# Patient Record
Sex: Male | Born: 1955 | Race: White | Hispanic: No | Marital: Married | Smoking: Former smoker
Health system: Southern US, Community
[De-identification: ages and names within clinical notes are randomized; demographics above are authoritative.]

## PROBLEM LIST (undated history)

## (undated) DIAGNOSIS — B029 Zoster without complications: Secondary | ICD-10-CM

## (undated) DIAGNOSIS — L570 Actinic keratosis: Secondary | ICD-10-CM

## (undated) HISTORY — DX: Zoster without complications: B02.9

## (undated) HISTORY — DX: Actinic keratosis: L57.0

## (undated) HISTORY — PX: LASIK: SHX215

---

## 1988-04-05 HISTORY — PX: HERNIA REPAIR: SHX51

## 2000-04-05 HISTORY — PX: NOSE SURGERY: SHX723

## 2000-04-05 HISTORY — PX: TONSILLECTOMY AND ADENOIDECTOMY: SUR1326

## 2003-03-06 ENCOUNTER — Other Ambulatory Visit: Payer: Self-pay

## 2003-04-06 DIAGNOSIS — K219 Gastro-esophageal reflux disease without esophagitis: Secondary | ICD-10-CM | POA: Insufficient documentation

## 2003-04-08 ENCOUNTER — Ambulatory Visit (HOSPITAL_COMMUNITY): Admission: RE | Admit: 2003-04-08 | Discharge: 2003-04-08 | Payer: Self-pay | Admitting: Cardiovascular Disease

## 2005-03-25 ENCOUNTER — Inpatient Hospital Stay: Payer: Self-pay | Admitting: Specialist

## 2005-09-13 DIAGNOSIS — E781 Pure hyperglyceridemia: Secondary | ICD-10-CM | POA: Insufficient documentation

## 2006-03-03 DIAGNOSIS — G4733 Obstructive sleep apnea (adult) (pediatric): Secondary | ICD-10-CM | POA: Insufficient documentation

## 2006-04-05 HISTORY — PX: KNEE SURGERY: SHX244

## 2006-09-05 DIAGNOSIS — Z8249 Family history of ischemic heart disease and other diseases of the circulatory system: Secondary | ICD-10-CM | POA: Insufficient documentation

## 2006-09-05 DIAGNOSIS — E291 Testicular hypofunction: Secondary | ICD-10-CM | POA: Insufficient documentation

## 2006-09-05 DIAGNOSIS — E349 Endocrine disorder, unspecified: Secondary | ICD-10-CM | POA: Insufficient documentation

## 2007-08-30 ENCOUNTER — Ambulatory Visit: Payer: Self-pay | Admitting: Gastroenterology

## 2007-08-30 LAB — HM COLONOSCOPY

## 2007-10-12 ENCOUNTER — Ambulatory Visit: Payer: Self-pay | Admitting: Specialist

## 2009-09-23 DIAGNOSIS — I10 Essential (primary) hypertension: Secondary | ICD-10-CM | POA: Insufficient documentation

## 2009-09-28 DIAGNOSIS — J309 Allergic rhinitis, unspecified: Secondary | ICD-10-CM | POA: Insufficient documentation

## 2010-06-24 ENCOUNTER — Emergency Department: Payer: Self-pay | Admitting: Emergency Medicine

## 2013-03-09 ENCOUNTER — Ambulatory Visit: Payer: Self-pay | Admitting: Unknown Physician Specialty

## 2013-07-27 LAB — LIPID PANEL
Cholesterol: 172 mg/dL (ref 0–200)
HDL: 33 mg/dL — AB (ref 35–70)
LDL Cholesterol: 73 mg/dL
Triglycerides: 331 mg/dL — AB (ref 40–160)

## 2013-07-27 LAB — PSA: PSA: 0.8

## 2013-07-27 LAB — CBC AND DIFFERENTIAL
HCT: 46 % (ref 41–53)
Hemoglobin: 15.4 g/dL (ref 13.5–17.5)
Platelets: 282 10*3/uL (ref 150–399)

## 2013-07-27 LAB — HEPATIC FUNCTION PANEL
ALT: 29 U/L (ref 10–40)
AST: 23 U/L (ref 14–40)

## 2013-07-27 LAB — BASIC METABOLIC PANEL: Glucose: 94 mg/dL

## 2013-07-27 LAB — TSH: TSH: 3.38 u[IU]/mL (ref <=5.90)

## 2014-09-20 ENCOUNTER — Other Ambulatory Visit: Payer: Self-pay | Admitting: Family Medicine

## 2014-09-20 MED ORDER — LOVASTATIN 20 MG PO TABS: 20.0000 mg | ORAL_TABLET | Freq: Every day | ORAL | Status: DC

## 2014-09-23 ENCOUNTER — Other Ambulatory Visit: Payer: Self-pay | Admitting: Family Medicine

## 2014-09-26 DIAGNOSIS — M545 Low back pain, unspecified: Secondary | ICD-10-CM | POA: Insufficient documentation

## 2014-09-26 DIAGNOSIS — B029 Zoster without complications: Secondary | ICD-10-CM

## 2014-09-26 HISTORY — DX: Zoster without complications: B02.9

## 2014-09-27 ENCOUNTER — Ambulatory Visit (INDEPENDENT_AMBULATORY_CARE_PROVIDER_SITE_OTHER): Payer: BC Managed Care – PPO | Admitting: Family Medicine

## 2014-09-27 ENCOUNTER — Encounter: Payer: Self-pay | Admitting: Family Medicine

## 2014-09-27 VITALS — BP 120/80 | HR 80 | Temp 98.0°F | Resp 16 | Ht 70.0 in | Wt 187.0 lb

## 2014-09-27 DIAGNOSIS — E781 Pure hyperglyceridemia: Secondary | ICD-10-CM | POA: Diagnosis not present

## 2014-09-27 DIAGNOSIS — Z0000 Encounter for general adult medical examination without abnormal findings: Secondary | ICD-10-CM

## 2014-09-27 DIAGNOSIS — I10 Essential (primary) hypertension: Secondary | ICD-10-CM | POA: Diagnosis not present

## 2014-09-27 DIAGNOSIS — E291 Testicular hypofunction: Secondary | ICD-10-CM | POA: Diagnosis not present

## 2014-09-27 DIAGNOSIS — K219 Gastro-esophageal reflux disease without esophagitis: Secondary | ICD-10-CM | POA: Diagnosis not present

## 2014-09-27 DIAGNOSIS — G4733 Obstructive sleep apnea (adult) (pediatric): Secondary | ICD-10-CM

## 2014-09-27 DIAGNOSIS — J309 Allergic rhinitis, unspecified: Secondary | ICD-10-CM | POA: Diagnosis not present

## 2014-09-27 DIAGNOSIS — R5383 Other fatigue: Secondary | ICD-10-CM | POA: Diagnosis not present

## 2014-09-27 NOTE — Progress Notes (Signed)
Patient: Tyler Roberts, Male    DOB: [birth year], 59 y.o.   MRN: [id number] Visit Date: 09/27/2014  Today's Provider: Lelon Huh, MD   No chief complaint on file.  Subjective:    Annual physical exam Tyler Roberts is a 59 y.o. male who presents today for health maintenance and complete physical. He feels fairly well. He reports exercising yes. He reports he is sleeping poorly around 6 hours a night.     Hypertension, follow-up:  BP Readings from Last 3 Encounters:  07/02/13 142/80    He was last seen for hypertension 15  months ago.  BP at that visit was 142/80. Management changes since that visit include no changes .He reports good compliance with treatment. He is not having side effects none. He is exercising. He is adherent to low salt diet.   Outside blood pressures are 120/80. He is experiencing fatigue.  Patient denies none.   Cardiovascular risk factors include none.  Use of agents associated with hypertension: none.   ------------------------------------------------------------------------    Lipid/Cholesterol, Follow-up:   Last seen for this 15  months ago.  Management changes since that visit include continue Lovastatin 20 mg qd .  Last Lipid Panel:    Component Value Date/Time   CHOL 172 07/27/2013   TRIG 331* 07/27/2013   HDL 33* 07/27/2013   LDLCALC 73 07/27/2013    He reports good compliance with treatment. He is not having side effects. noe  Wt Readings from Last 3 Encounters:  09/27/14 187 lb (84.823 kg)  07/02/13 192 lb (87.091 kg)    ------------------------------------------------------------------------  Fatigue the last few months. Has had more stress and not sleeping as well. No other health problems lately.   Sleep Apnea Continues to use CPAP every night and working well.   Review of Systems  Constitutional: Positive for fatigue. Negative for fever, chills, diaphoresis, activity change, appetite change and unexpected weight  change.  HENT: Positive for congestion and sinus pressure. Negative for dental problem, drooling, ear discharge, ear pain, facial swelling, hearing loss, mouth sores, nosebleeds, postnasal drip, rhinorrhea, sneezing, sore throat, tinnitus, trouble swallowing and voice change.   Eyes: Negative.   Respiratory: Positive for apnea. Negative for cough, choking, chest tightness, shortness of breath, wheezing and stridor.   Cardiovascular: Negative.   Endocrine: Negative.   Genitourinary: Negative.   Musculoskeletal: Positive for arthralgias. Negative for myalgias, back pain, joint swelling, gait problem, neck pain and neck stiffness.  Skin: Negative.   Allergic/Immunologic: Positive for environmental allergies. Negative for food allergies and immunocompromised state.  Neurological: Negative.   Hematological: Negative.   Psychiatric/Behavioral: Negative.     Social History He  reports that he has been smoking Cigars.  He has never used smokeless tobacco. He reports that he drinks alcohol. He reports that he does not use illicit drugs.  Patient Active Problem List   Diagnosis Date Noted  . 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 LBP (low back pain) 09/26/2014  . Allergic rhinitis 09/28/2009  . 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 Benign essential hypertension 09/23/2009  . Fam hx-ischem heart disease 09/05/2006  . Testicular hypofunction 09/05/2006  . Obstructive apnea 03/03/2006  . Hyperglyceridemia, pure 09/13/2005  . Acid reflux 04/06/2003    Past Surgical History  Procedure Laterality Date  . Knee surgery Right 2008    Due to a staph infection  . Tonsillectomy and adenoidectomy  2002  . Nose surgery  2002  . Hernia repair  1990  . Lasik  Family History His family history includes Anuerysm in his mother; Healthy in his brother, sister, and sister; Heart attack in his father; Heart disease (age of onset: 66's) in his father; Ovarian cancer in his sister.    Previous  Medications   AMLODIPINE (NORVASC) 5 MG TABLET    Take by mouth.   ASPIRIN 81 MG TABLET       ESOMEPRAZOLE (NEXIUM) 40 MG CAPSULE    Take by mouth.   FLUNISOLIDE (NASALIDE) 25 MCG/ACT (0.025%) SOLN    ONE SPRAY IN EACH NOSTRIL EVERY DAY   LOVASTATIN (MEVACOR) 20 MG TABLET    Take 1 tablet (20 mg total) by mouth daily.   METOPROLOL SUCCINATE (TOPROL-XL) 25 MG 24 HR TABLET    Take by mouth.    Patient Care Team: [name], MD as PCP - General (Family Medicine)     Objective:   Vitals: BP 120/80 mmHg  Pulse 80  Temp(Src) 98 F (36.7 C) (Oral)  Resp 16  Ht 5\' 10"  (1.778 m)  Wt 187 lb (84.823 kg)  BMI 26.83 kg/m2  SpO2 97%   Physical Exam  General Appearance:    Alert, cooperative, no distress, appears stated age  Head:    Normocephalic, without obvious abnormality, atraumatic  Eyes:    PERRL, conjunctiva/corneas clear, EOM's intact, fundi    benign, both eyes       Ears:    Normal TM's and external ear canals, both ears  Nose:   Nares normal, septum midline, mucosa normal, no drainage   or sinus tenderness  Throat:   Lips, mucosa, and tongue normal; teeth and gums normal  Neck:   Supple, symmetrical, trachea midline, no adenopathy;       thyroid:  No enlargement/tenderness/nodules; no carotid   bruit or JVD  Back:     Symmetric, no curvature, ROM normal, no CVA tenderness  Lungs:     Clear to auscultation bilaterally, respirations unlabored  Chest wall:    No tenderness or deformity  Heart:    Regular rate and rhythm, S1 and S2 normal, no murmur, rub   or gallop  Abdomen:     Soft, non-tender, bowel sounds active all four quadrants,    no masses, no organomegaly  Genitalia:    deferred  Rectal:    deferred  Extremities:   Extremities normal, atraumatic, no cyanosis or edema  Pulses:   2+ and symmetric all extremities  Skin:   Skin color, texture, turgor normal, no rashes or lesions  Lymph nodes:   Cervical, supraclavicular, and axillary nodes normal  Neurologic:    CNII-XII intact. Normal strength, sensation and reflexes      throughout     Depression Screen PHQ 2/9 Scores 09/27/2014  PHQ - 2 Score 0    EKG Sinus  Rhythm  WITHIN NORMAL LIMITS    Assessment & Plan:     Routine Health Maintenance and Physical Exam  Exercise Activities and Dietary recommendations Goals    None      Immunization History  Administered Date(s) Administered  . Tdap 07/25/2007    Health Maintenance  Topic Date Due  . HIV Screening  01/03/1971  . COLONOSCOPY  01/02/2006  . INFLUENZA VACCINE  11/04/2014  . TETANUS/TDAP  07/24/2017      Discussed health benefits of physical activity, and encouraged him to engage in regular exercise appropriate for his age and condition.    --------------------------------------------------------------------   1. Annual physical exam  - PSA  2.  Benign essential hypertension well controlled Continue current medications.  - EKG 12-Lead  3. Gastroesophageal reflux disease, esophagitis presence not specified well controlled Continue PPI - Magnesium  4. Hyperglyceridemia, pure He is tolerating lovastatin well with no adverse effects.   - Lipid panel  5. Obstructive apnea Using CPAP every day and working well.   6. Allergic rhinitis, unspecified allergic rhinitis type Doing well with prn nasal steroid  7. Testicular hypofunction  - Testosterone,Free and Total  8. Other fatigue Seems to stress related.  - CBC - TSH - Comprehensive metabolic panel

## 2014-09-28 LAB — CBC
Hematocrit: 44 % (ref 37.5–51.0)
Hemoglobin: 15.1 g/dL (ref 12.6–17.7)
MCH: 29.7 pg (ref 26.6–33.0)
MCHC: 34.3 g/dL (ref 31.5–35.7)
MCV: 86 fL (ref 79–97)
Platelets: 246 10*3/uL (ref 150–379)
RBC: 5.09 x10E6/uL (ref 4.14–5.80)
RDW: 14.1 % (ref 12.3–15.4)
WBC: 5.6 10*3/uL (ref 3.4–10.8)

## 2014-09-28 LAB — LIPID PANEL
Chol/HDL Ratio: 4.3 ratio units (ref 0.0–5.0)
Cholesterol, Total: 176 mg/dL (ref 100–199)
HDL: 41 mg/dL (ref >39)
LDL Calculated: 102 mg/dL — ABNORMAL HIGH (ref 0–99)
Triglycerides: 166 mg/dL — ABNORMAL HIGH (ref 0–149)
VLDL Cholesterol Cal: 33 mg/dL (ref 5–40)

## 2014-09-28 LAB — COMPREHENSIVE METABOLIC PANEL
ALT: 25 IU/L (ref 0–44)
AST: 20 IU/L (ref 0–40)
Albumin/Globulin Ratio: 2.2 (ref 1.1–2.5)
Albumin: 4.8 g/dL (ref 3.5–5.5)
Alkaline Phosphatase: 50 IU/L (ref 39–117)
BUN/Creatinine Ratio: 14 (ref 9–20)
BUN: 12 mg/dL (ref 6–24)
Bilirubin Total: 1.2 mg/dL (ref 0.0–1.2)
CO2: 27 mmol/L (ref 18–29)
Calcium: 9.4 mg/dL (ref 8.7–10.2)
Chloride: 102 mmol/L (ref 97–108)
Creatinine, Ser: 0.87 mg/dL (ref 0.76–1.27)
GFR calc Af Amer: 110 mL/min/{1.73_m2} (ref >59)
GFR calc non Af Amer: 95 mL/min/{1.73_m2} (ref >59)
Globulin, Total: 2.2 g/dL (ref 1.5–4.5)
Glucose: 108 mg/dL — ABNORMAL HIGH (ref 65–99)
Potassium: 4.4 mmol/L (ref 3.5–5.2)
Sodium: 143 mmol/L (ref 134–144)
Total Protein: 7 g/dL (ref 6.0–8.5)

## 2014-09-28 LAB — TESTOSTERONE,FREE AND TOTAL: Testosterone, Free: 5.8 pg/mL — ABNORMAL LOW (ref 7.2–24.0)

## 2014-09-28 LAB — TSH: TSH: 1.86 u[IU]/mL (ref 0.450–4.500)

## 2014-09-28 LAB — PSA: Prostate Specific Ag, Serum: 0.9 ng/mL (ref 0.0–4.0)

## 2014-09-28 LAB — MAGNESIUM: Magnesium: 2.1 mg/dL (ref 1.6–2.3)

## 2014-09-30 ENCOUNTER — Encounter: Payer: Self-pay | Admitting: Family Medicine

## 2014-10-01 ENCOUNTER — Telehealth: Payer: Self-pay | Admitting: Family Medicine

## 2014-10-01 DIAGNOSIS — E291 Testicular hypofunction: Secondary | ICD-10-CM

## 2014-10-01 NOTE — Telephone Encounter (Signed)
They will have to fax it because it is not coming through the EMR.

## 2014-10-01 NOTE — Telephone Encounter (Signed)
-----   Message from Luvenia Heller, [place] sent at 10/01/2014  9:19 AM EDT ----- I called and they sent both the free and total again.

## 2014-10-01 NOTE — Telephone Encounter (Signed)
Testosterone level is still low. Need to increase Androgel 1.62% to 4 gel pumps every day, 150g refill x 3. Follow up in 3 months. All other labs are normal.

## 2014-10-02 NOTE — Telephone Encounter (Signed)
Tried calling patient and VM is not set up. Will try again later.

## 2014-10-03 ENCOUNTER — Encounter: Payer: Self-pay | Admitting: Family Medicine

## 2014-10-08 MED ORDER — ANDROGEL PUMP 20.25 MG/ACT (1.62%) TD GEL: TRANSDERMAL | Status: DC

## 2014-10-08 NOTE — Telephone Encounter (Signed)
Patient advised. Prescription called into pharmacy.

## 2014-10-18 ENCOUNTER — Other Ambulatory Visit: Payer: Self-pay | Admitting: Family Medicine

## 2014-10-28 ENCOUNTER — Other Ambulatory Visit: Payer: Self-pay | Admitting: Family Medicine

## 2014-10-30 ENCOUNTER — Other Ambulatory Visit: Payer: Self-pay | Admitting: Family Medicine

## 2014-10-30 NOTE — Telephone Encounter (Signed)
Last office visit was on 09/27/2014

## 2015-02-17 ENCOUNTER — Other Ambulatory Visit: Payer: Self-pay | Admitting: Family Medicine

## 2015-02-19 NOTE — Telephone Encounter (Signed)
Please call in lorazepam.  

## 2015-02-21 NOTE — Telephone Encounter (Signed)
Androgel called into pharmacy.

## 2015-04-17 ENCOUNTER — Other Ambulatory Visit: Payer: Self-pay | Admitting: Family Medicine

## 2015-05-06 ENCOUNTER — Other Ambulatory Visit: Payer: Self-pay | Admitting: Family Medicine

## 2015-05-06 MED ORDER — TESTOSTERONE 10 MG/ACT (2%) TD GEL: 8.0000 | Freq: Every day | TRANSDERMAL | Status: DC

## 2015-05-12 ENCOUNTER — Other Ambulatory Visit: Payer: Self-pay | Admitting: Family Medicine

## 2015-05-14 ENCOUNTER — Other Ambulatory Visit: Payer: Self-pay | Admitting: Family Medicine

## 2015-06-02 ENCOUNTER — Encounter: Payer: Self-pay | Admitting: Physician Assistant

## 2015-06-02 ENCOUNTER — Ambulatory Visit (INDEPENDENT_AMBULATORY_CARE_PROVIDER_SITE_OTHER): Payer: BC Managed Care – PPO | Admitting: Physician Assistant

## 2015-06-02 VITALS — BP 160/90 | HR 78 | Temp 99.6°F | Resp 16 | Wt 192.0 lb

## 2015-06-02 DIAGNOSIS — J189 Pneumonia, unspecified organism: Secondary | ICD-10-CM

## 2015-06-02 DIAGNOSIS — R0989 Other specified symptoms and signs involving the circulatory and respiratory systems: Secondary | ICD-10-CM | POA: Diagnosis not present

## 2015-06-02 DIAGNOSIS — R05 Cough: Secondary | ICD-10-CM | POA: Diagnosis not present

## 2015-06-02 DIAGNOSIS — R059 Cough, unspecified: Secondary | ICD-10-CM

## 2015-06-02 DIAGNOSIS — J181 Lobar pneumonia, unspecified organism: Secondary | ICD-10-CM

## 2015-06-02 MED ORDER — HYDROCODONE-HOMATROPINE 5-1.5 MG/5ML PO SYRP: 5.0000 mL | ORAL_SOLUTION | Freq: Three times a day (TID) | ORAL | Status: DC | PRN

## 2015-06-02 MED ORDER — LEVOFLOXACIN 750 MG PO TABS: 750.0000 mg | ORAL_TABLET | Freq: Every day | ORAL | Status: DC

## 2015-06-02 NOTE — Progress Notes (Signed)
Patient: Tyler Roberts Male    DOB: 10-Jan-1956   60 y.o.   MRN: [id number] Visit Date: 06/02/2015  Today's Provider: Mar Daring, PA-C   Chief Complaint  Patient presents with  . Cough   Subjective:    Cough This is a new problem. The current episode started in the past 7 days (Friday afternoon). The problem has been gradually worsening. The problem occurs constantly. The cough is productive of blood-tinged sputum. Associated symptoms include ear congestion, a fever (Low grade fever 99 to 100.5), headaches, myalgias, nasal congestion, postnasal drip, rhinorrhea, shortness of breath and wheezing. Pertinent negatives include no chest pain, ear pain, heartburn or sore throat. The symptoms are aggravated by lying down (When he gets hot.). Treatments tried: Mucinex DM and Advil, took some at 10:00 am. Increase Fluids. The treatment provided no relief. His past medical history is significant for pneumonia (in the past.).       Allergies  Allergen Reactions  . Fenofibrate     Other reaction(s): Muscle Pain Elevated CK  . Gemfibrozil     Other reaction(s): Muscle Pain  . Niacin     Flushing   Previous Medications   AMLODIPINE (NORVASC) 5 MG TABLET    Take by mouth.   ASPIRIN 81 MG TABLET       ESOMEPRAZOLE (NEXIUM) 40 MG CAPSULE    TAKE 1 CAPSULE EVERY DAY   FLUNISOLIDE (NASALIDE) 25 MCG/ACT (0.025%) SOLN    SPRAY ONCE INTO EACH NOSTRIL EVERY DAY   LOVASTATIN (MEVACOR) 20 MG TABLET    TAKE ONE TABLET EVERY DAY   METOPROLOL SUCCINATE (TOPROL-XL) 25 MG 24 HR TABLET    TAKE ONE TABLET EVERY DAY   TESTOSTERONE 10 MG/ACT (2%) GEL    Place 8 Act onto the skin daily.    Review of Systems  Constitutional: Positive for fever (Low grade fever 99 to 100.5).  HENT: Positive for congestion, postnasal drip, rhinorrhea, sinus pressure and sneezing. Negative for ear pain and sore throat.   Respiratory: Positive for cough (Last night his cough sounds like a bark), chest tightness,  shortness of breath and wheezing.   Cardiovascular: Negative for chest pain and leg swelling.  Gastrointestinal: Negative for heartburn, nausea, vomiting and abdominal pain.  Musculoskeletal: Positive for myalgias.       Feet and ankles hurting  Neurological: Positive for dizziness and headaches.    Social History  Substance Use Topics  . Smoking status: Former Smoker    Types: Cigars  . Smokeless tobacco: Never Used  . Alcohol Use: 0.0 oz/week    0 Standard drinks or equivalent per week     Comment: Occasional   Objective:   BP 160/90 mmHg  Pulse 78  Temp(Src) 99.6 F (37.6 C) (Oral)  Resp 16  Wt 192 lb (87.091 kg)  SpO2 97%  Physical Exam  Constitutional: He appears well-developed and well-nourished. No distress.  HENT:  Head: Normocephalic and atraumatic.  Right Ear: Hearing, tympanic membrane, external ear and ear canal normal. Tympanic membrane is not erythematous and not bulging. No middle ear effusion.  Left Ear: Hearing, tympanic membrane, external ear and ear canal normal. Tympanic membrane is not erythematous and not bulging.  No middle ear effusion.  Nose: Mucosal edema and rhinorrhea present. Right sinus exhibits no maxillary sinus tenderness and no frontal sinus tenderness. Left sinus exhibits no maxillary sinus tenderness and no frontal sinus tenderness.  Mouth/Throat: Uvula is midline, oropharynx is clear and moist  and mucous membranes are normal. No oropharyngeal exudate, posterior oropharyngeal edema or posterior oropharyngeal erythema.  Eyes: Conjunctivae and EOM are normal. Pupils are equal, round, and reactive to light. Right eye exhibits no discharge. Left eye exhibits no discharge.  Neck: Normal range of motion. Neck supple. No tracheal deviation present. No Brudzinski's sign and no Kernig's sign noted. No thyromegaly present.  Cardiovascular: Normal rate, regular rhythm and normal heart sounds.  Exam reveals no gallop and no friction rub.   No murmur  heard. Pulmonary/Chest: Effort normal. No stridor. No respiratory distress. He has decreased breath sounds in the right lower field. He has no wheezes. He has rhonchi in the right lower field. He has no rales. He exhibits no tenderness and no bony tenderness.  Lymphadenopathy:    He has no cervical adenopathy.  Skin: Skin is warm and dry. He is not diaphoretic.  Vitals reviewed.       Assessment & Plan:     1. Cough Worsening symptoms. Will treat with levaquin as below. Will also give hycodan cough syrup for nighttime cough.  Advised of drowsiness precautions with medication.  He needs to stay well hydrated and get plenty of rest. He is to call the office if symptoms worsen or fail to improve.  May consider adding prednisone if not much improvement in the next few days. We did discuss obtaining a CXR due to abnormal lung sounds and discomfort over right side of back with pink tinged sputum but patient refused at this time.  - levofloxacin (LEVAQUIN) 750 MG tablet; Take 1 tablet (750 mg total) by mouth daily.  Dispense: 10 tablet; Refill: 0 - HYDROcodone-homatropine (HYCODAN) 5-1.5 MG/5ML syrup; Take 5 mLs by mouth every 8 (eight) hours as needed for cough.  Dispense: 240 mL; Refill: 0  2. Rhonchi See above medical treatment plan. - levofloxacin (LEVAQUIN) 750 MG tablet; Take 1 tablet (750 mg total) by mouth daily.  Dispense: 10 tablet; Refill: 0  3. Right lower lobe pneumonia Will treat empirically as pneumonia due to lung findings however patient refused CXR at this time.  He is to call if symptoms fail to improve or worsen. - levofloxacin (LEVAQUIN) 750 MG tablet; Take 1 tablet (750 mg total) by mouth daily.  Dispense: 10 tablet; Refill: 0       Mar Daring, PA-C  [facility]

## 2015-06-02 NOTE — Patient Instructions (Signed)

## 2015-06-04 MED ORDER — PREDNISONE 10 MG PO TABS: ORAL_TABLET | ORAL | Status: DC

## 2015-06-04 NOTE — Addendum Note (Signed)
Addended by: Mar Daring on: 06/04/2015 07:18 AM   Modules accepted: Orders

## 2015-10-08 ENCOUNTER — Ambulatory Visit (INDEPENDENT_AMBULATORY_CARE_PROVIDER_SITE_OTHER): Payer: BC Managed Care – PPO | Admitting: Family Medicine

## 2015-10-08 ENCOUNTER — Encounter: Payer: Self-pay | Admitting: Family Medicine

## 2015-10-08 VITALS — BP 162/82 | HR 95 | Temp 98.5°F | Resp 16 | Ht 69.5 in | Wt 190.0 lb

## 2015-10-08 DIAGNOSIS — N528 Other male erectile dysfunction: Secondary | ICD-10-CM | POA: Diagnosis not present

## 2015-10-08 DIAGNOSIS — Z0000 Encounter for general adult medical examination without abnormal findings: Secondary | ICD-10-CM

## 2015-10-08 DIAGNOSIS — E291 Testicular hypofunction: Secondary | ICD-10-CM | POA: Diagnosis not present

## 2015-10-08 DIAGNOSIS — G4733 Obstructive sleep apnea (adult) (pediatric): Secondary | ICD-10-CM

## 2015-10-08 DIAGNOSIS — M545 Low back pain, unspecified: Secondary | ICD-10-CM

## 2015-10-08 DIAGNOSIS — G4700 Insomnia, unspecified: Secondary | ICD-10-CM

## 2015-10-08 DIAGNOSIS — N529 Male erectile dysfunction, unspecified: Secondary | ICD-10-CM | POA: Insufficient documentation

## 2015-10-08 DIAGNOSIS — R5383 Other fatigue: Secondary | ICD-10-CM

## 2015-10-08 DIAGNOSIS — E781 Pure hyperglyceridemia: Secondary | ICD-10-CM | POA: Diagnosis not present

## 2015-10-08 DIAGNOSIS — I10 Essential (primary) hypertension: Secondary | ICD-10-CM

## 2015-10-08 MED ORDER — METOPROLOL SUCCINATE ER 25 MG PO TB24: 12.5000 mg | ORAL_TABLET | Freq: Every day | ORAL | Status: DC

## 2015-10-08 MED ORDER — BENAZEPRIL HCL 10 MG PO TABS: 10.0000 mg | ORAL_TABLET | Freq: Every day | ORAL | Status: DC

## 2015-10-08 MED ORDER — SILDENAFIL CITRATE 50 MG PO TABS: 50.0000 mg | ORAL_TABLET | Freq: Every day | ORAL | Status: DC | PRN

## 2015-10-08 NOTE — Progress Notes (Signed)
Patient: Tyler Roberts, Male    DOB: [birth year], 60 y.o.   MRN: [id number] Visit Date: 10/08/2015  Today's Provider: Lelon Huh, MD   Chief Complaint  Patient presents with  . Annual Exam  . Hypertension  . Hyperlipidemia  . Gastroesophageal Reflux   Subjective:    Annual physical exam Tyler Roberts is a 60 y.o. male who presents today for health maintenance and complete physical. He feels well. He reports exercising several times per week. He reports he is sleeping poorly.  -----------------------------------------------------------------  Hypertension, follow-up:  BP Readings from Last 3 Encounters:  06/02/15 160/90  09/27/14 120/80  07/02/13 142/80    He was last seen for hypertension 1 years ago.  BP at that visit was 120/80. Management since that visit includes no changes. He reports good compliance with treatment. He is not having side effects.  He is exercising. He is adherent to low salt diet.   Outside blood pressures are 140/80. He is experiencing fatigue.  Patient denies chest pain, chest pressure/discomfort, claudication, dyspnea, exertional chest pressure/discomfort, irregular heart beat, lower extremity edema, near-syncope, orthopnea, palpitations, paroxysmal nocturnal dyspnea, syncope and tachypnea.   Cardiovascular risk factors include advanced age (older than 14 for men, 30 for women), hypertension, male gender and smoking/ tobacco exposure.  Use of agents associated with hypertension: NSAIDS    Weight trend: stable Wt Readings from Last 3 Encounters:  06/02/15 192 lb (87.091 kg)  09/27/14 187 lb (84.823 kg)  07/02/13 192 lb (87.091 kg)    Current diet: in general, a "healthy" diet    ------------------------------------------------------------------------   Lipid/Cholesterol, Follow-up:   Last seen for this1 years ago.  Management changes since that visit include no changes. . Last Lipid Panel:    Component Value Date/Time   CHOL 176 09/27/2014 1032   CHOL 172 07/27/2013   TRIG 166* 09/27/2014 1032   HDL 41 09/27/2014 1032   HDL 33* 07/27/2013   CHOLHDL 4.3 09/27/2014 1032   LDLCALC 102* 09/27/2014 1032   [place] 73 07/27/2013    Risk factors for vascular disease include hypertension  He reports good compliance with treatment. He is not having side effects.  Current symptoms include none and have been stable. Weight trend: stable Prior visit with dietician: no Current diet: in general, a "healthy" diet   Current exercise: walking  Wt Readings from Last 3 Encounters:  06/02/15 192 lb (87.091 kg)  09/27/14 187 lb (84.823 kg)  07/02/13 192 lb (87.091 kg)    ------------------------------------------------------------------- Follow up Obstructive Apnea:  Last office visit was 1 year ago and no changes were made. Patient uses a CPAP at night and wakes up several times throughout the night.  Follow up GERD:  Patient was last seen 1 year ago and no changes were made. Patient reports good complianec with treatment and good symptom control.   Follow up Testicular Hypofunction:  Last office visit was 1 year ago and no changes were made. Insurance switched to generic Androgel the first of the year.   Review of Systems  Constitutional: Positive for fever. Negative for chills, appetite change and fatigue.  HENT: Positive for dental problem, ear pain and sinus pressure. Negative for congestion, hearing loss, nosebleeds and trouble swallowing.   Eyes: Negative for pain and visual disturbance.  Respiratory: Positive for apnea. Negative for cough, chest tightness and shortness of breath.   Cardiovascular: Negative for chest pain, palpitations and leg swelling.  Gastrointestinal: Positive for nausea. Negative for vomiting,  abdominal pain, diarrhea, constipation and blood in stool.  Endocrine: Negative for polydipsia, polyphagia and polyuria.  Genitourinary: Negative for dysuria and flank pain.    Musculoskeletal: Positive for back pain and arthralgias. Negative for myalgias, joint swelling and neck stiffness.  Skin: Negative for color change, rash and wound.  Neurological: Positive for headaches. Negative for dizziness, tremors, seizures, speech difficulty, weakness and light-headedness.  Psychiatric/Behavioral: Negative for behavioral problems, confusion, sleep disturbance, dysphoric mood and decreased concentration. The patient is nervous/anxious.   All other systems reviewed and are negative.   Social History      He  reports that he has been smoking Cigars.  He has never used smokeless tobacco. He reports that he drinks alcohol. He reports that he does not use illicit drugs.       Social History   Social History  . Marital Status: Married    Spouse Name: N/A  . Number of Children: 1  . Years of Education: H/S   Occupational History  . Scientist, forensic   Social History Main Topics  . Smoking status: Current Some Day Smoker    Types: Cigars  . Smokeless tobacco: Never Used  . Alcohol Use: 0.0 oz/week    0 Standard drinks or equivalent per week     Comment: Occasional  . Drug Use: No  . Sexual Activity: Not Asked   Other Topics Concern  . None   Social History Narrative    Past Medical History  Diagnosis Date  . Shingles 09/26/2014     Patient Active Problem List   Diagnosis Date Noted  . LBP (low back pain) 09/26/2014  . Allergic rhinitis 09/28/2009  . 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 Benign essential hypertension 09/23/2009  . Fam hx-ischem heart disease 09/05/2006  . Testicular hypofunction 09/05/2006  . Obstructive apnea 03/03/2006  . Hyperglyceridemia, pure 09/13/2005  . Acid reflux 04/06/2003    Past Surgical History  Procedure Laterality Date  . Knee surgery Right 2008    Due to a staph infection  . Tonsillectomy and adenoidectomy  2002  . Nose surgery  2002  . Hernia repair  1990   . Lasik      Family History        Family Status  Relation Status Death Age  . Mother Deceased   . Father Deceased   . Sister Deceased   . Brother Alive   . Sister Alive   . Sister Alive         His family history includes Anuerysm in his mother; Healthy in his brother, sister, and sister; Heart attack in his father; Heart disease (age of onset: 60's) in his father; Ovarian cancer in his sister.    Allergies  Allergen Reactions  . Fenofibrate     Other reaction(s): Muscle Pain Elevated CK  . Gemfibrozil     Other reaction(s): Muscle Pain  . Niacin     Flushing    Current Meds  Medication Sig  . amLODipine (NORVASC) 5 MG tablet Take by mouth.  Marland Kitchen aspirin 81 MG tablet   . esomeprazole (NEXIUM) 40 MG capsule TAKE 1 CAPSULE EVERY DAY  . flunisolide (NASALIDE) 25 MCG/ACT (0.025%) SOLN SPRAY ONCE INTO EACH NOSTRIL EVERY DAY  . lovastatin (MEVACOR) 20 MG tablet TAKE ONE TABLET EVERY DAY  . metoprolol succinate (TOPROL-XL) 25 MG 24 hr tablet TAKE ONE TABLET EVERY DAY  . Testosterone 10 MG/ACT (2%) GEL Place 8 Act onto  the skin daily.  . [DISCONTINUED] HYDROcodone-homatropine (HYCODAN) 5-1.5 MG/5ML syrup Take 5 mLs by mouth every 8 (eight) hours as needed for cough.  . [DISCONTINUED] levofloxacin (LEVAQUIN) 750 MG tablet Take 1 tablet (750 mg total) by mouth daily.  . [DISCONTINUED] predniSONE (DELTASONE) 10 MG tablet Take 6 tabs PO on day 1&2, 5 tabs PO on day 3&4, 4 tabs PO on day 5&6, 3 tabs PO on day 7&8, 2 tabs PO on day 9&10, 1 tab PO on day 11&12.    Patient Care Team: [name], MD as PCP - General (Family Medicine)     Objective:   Vitals: BP 162/82 mmHg  Pulse 95  Temp(Src) 98.5 F (36.9 C) (Oral)  Resp 16  Ht 5' 9.5" (1.765 m)  Wt 190 lb (86.183 kg)  BMI 27.67 kg/m2  SpO2 96%   Physical Exam   General Appearance:    Alert, cooperative, no distress, appears stated age  Head:    Normocephalic, without obvious abnormality, atraumatic  Eyes:     PERRL, conjunctiva/corneas clear, EOM's intact, fundi    benign, both eyes       Ears:    Normal TM's and external ear canals, both ears  Nose:   Nares normal, septum midline, mucosa normal, no drainage   or sinus tenderness  Throat:   Lips, mucosa, and tongue normal; teeth and gums normal  Neck:   Supple, symmetrical, trachea midline, no adenopathy;       thyroid:  No enlargement/tenderness/nodules; no carotid   bruit or JVD  Back:     Symmetric, no curvature, ROM normal, no CVA tenderness  Lungs:     Clear to auscultation bilaterally, respirations unlabored  Chest wall:    No tenderness or deformity  Heart:    Regular rate and rhythm, S1 and S2 normal, no murmur, rub   or gallop  Abdomen:     Soft, non-tender, bowel sounds active all four quadrants,    no masses, no organomegaly  Genitalia:    deferred  Rectal:    deferred  Extremities:   Extremities normal, atraumatic, no cyanosis or edema  Pulses:   2+ and symmetric all extremities  Skin:   Skin color, texture, turgor normal, no rashes or lesions  Lymph nodes:   Cervical, supraclavicular, and axillary nodes normal  Neurologic:   CNII-XII intact. Normal strength, sensation and reflexes      throughout    Depression Screen PHQ 2/9 Scores 10/08/2015 09/27/2014  PHQ - 2 Score 0 0  PHQ- 9 Score 6 -    Current Exercise Habits: Home exercise routine, Time (Minutes): 30, Frequency (Times/Week): 3, Weekly Exercise (Minutes/Week): 90, Intensity: Moderate Exercise limited by: None identified   Assessment & Plan:     Routine Health Maintenance and Physical Exam  Exercise Activities and Dietary recommendations Goals    None      Immunization History  Administered Date(s) Administered  . Tdap 07/25/2007    Health Maintenance  Topic Date Due  . HIV Screening  01/03/1971  . INFLUENZA VACCINE  11/04/2015  . TETANUS/TDAP  07/24/2017  . COLONOSCOPY  08/29/2017  . Hepatitis C Screening  Completed       Discussed health  benefits of physical activity, and encouraged him to engage in regular exercise appropriate for his age and condition.    -------------------------------------------------------------------- 1. Annual physical exam  - PSA - Comprehensive metabolic panel - CBC - TSH - Lipid panel - EKG 12-Lead  2. Benign essential  hypertension  - EKG 12-Lead - benazepril (LOTENSIN) 10 MG tablet; Take 1 tablet (10 mg total) by mouth daily.  Dispense: 30 tablet; Refill: 3 - metoprolol succinate (TOPROL-XL) 25 MG 24 hr tablet; Take 0.5 tablets (12.5 mg total) by mouth daily.  Dispense: 1 tablet; Refill: 1  3. Hyperglyceridemia, pure  - Lipid panel  4. Obstructive apnea   5. Right-sided low back pain without sciatica   6. Other fatigue  - Comprehensive metabolic panel - CBC - TSH  7. Hypogonadism in male  - Testosterone,Free and Total  8. Other male erectile dysfunction  - sildenafil (VIAGRA) 50 MG tablet; Take 1-2 tablets (50-100 mg total) by mouth daily as needed for erectile dysfunction (take 30 minute prior to intercourse).  Dispense: 10 tablet; Refill: 0  9. Insomnia     Lelon Huh, MD  [facility]

## 2015-10-08 NOTE — Patient Instructions (Addendum)
Start taking amlodipine in the evenings instead of in the mornings.    Insomnia Insomnia is a sleep disorder that makes it difficult to fall asleep or to stay asleep. Insomnia can cause tiredness (fatigue), low energy, difficulty concentrating, mood swings, and poor performance at work or school.  There are three different ways to classify insomnia:  Difficulty falling asleep.  Difficulty staying asleep.  Waking up too early in the morning. Any type of insomnia can be long-term (chronic) or short-term (acute). Both are common. Short-term insomnia usually lasts for three months or less. Chronic insomnia occurs at least three times a week for longer than three months. CAUSES  Insomnia may be caused by another condition, situation, or substance, such as:  Anxiety.  Certain medicines.  Gastroesophageal reflux disease (GERD) or other gastrointestinal conditions.  Asthma or other breathing conditions.  Restless legs syndrome, sleep apnea, or other sleep disorders.  Chronic pain.  Menopause. This may include hot flashes.  Stroke.  Abuse of alcohol, tobacco, or illegal drugs.  Depression.  Caffeine.   Neurological disorders, such as Alzheimer disease.  An overactive thyroid (hyperthyroidism). The cause of insomnia may not be known. RISK FACTORS Risk factors for insomnia include:  Gender. Women are more commonly affected than men.  Age. Insomnia is more common as you get older.  Stress. This may involve your professional or personal life.  Income. Insomnia is more common in people with lower income.  Lack of exercise.   Irregular work schedule or night shifts.  Traveling between different time zones. SIGNS AND SYMPTOMS If you have insomnia, trouble falling asleep or trouble staying asleep is the main symptom. This may lead to other symptoms, such as:  Feeling fatigued.  Feeling nervous about going to sleep.  Not feeling rested in the morning.  Having  trouble concentrating.  Feeling irritable, anxious, or depressed. TREATMENT  Treatment for insomnia depends on the cause. If your insomnia is caused by an underlying condition, treatment will focus on addressing the condition. Treatment may also include:   Medicines to help you sleep.  Counseling or therapy.  Lifestyle adjustments. HOME CARE INSTRUCTIONS   Take medicines only as directed by your health care provider.  Keep regular sleeping and waking hours. Avoid naps.  Keep a sleep diary to help you and your health care provider figure out what could be causing your insomnia. Include:   When you sleep.  When you wake up during the night.  How well you sleep.   How rested you feel the next day.  Any side effects of medicines you are taking.  What you eat and drink.   Make your bedroom a comfortable place where it is easy to fall asleep:  Put up shades or special blackout curtains to block light from outside.  Use a white noise machine to block noise.  Keep the temperature cool.   Exercise regularly as directed by your health care provider. Avoid exercising right before bedtime.  Use relaxation techniques to manage stress. Ask your health care provider to suggest some techniques that may work well for you. These may include:  Breathing exercises.  Routines to release muscle tension.  Visualizing peaceful scenes.  Cut back on alcohol, caffeinated beverages, and cigarettes, especially close to bedtime. These can disrupt your sleep.  Do not overeat or eat spicy foods right before bedtime. This can lead to digestive discomfort that can make it hard for you to sleep.  Limit screen use before bedtime. This includes:  Watching TV.  Using your smartphone, tablet, and computer.  Stick to a routine. This can help you fall asleep faster. Try to do a quiet activity, brush your teeth, and go to bed at the same time each night.  Get out of bed if you are still awake  after 15 minutes of trying to sleep. Keep the lights down, but try reading or doing a quiet activity. When you feel sleepy, go back to bed.  Make sure that you drive carefully. Avoid driving if you feel very sleepy.  Keep all follow-up appointments as directed by your health care provider. This is important. SEEK MEDICAL CARE IF:   You are tired throughout the day or have trouble in your daily routine due to sleepiness.  You continue to have sleep problems or your sleep problems get worse. SEEK IMMEDIATE MEDICAL CARE IF:   You have serious thoughts about hurting yourself or someone else.   This information is not intended to replace advice given to you by your health care provider. Make sure you discuss any questions you have with your health care provider.   Document Released: 03/19/2000 Document Revised: 12/11/2014 Document Reviewed: 12/21/2013 Elsevier Interactive Patient Education [employer].

## 2015-10-09 ENCOUNTER — Telehealth: Payer: Self-pay | Admitting: Family Medicine

## 2015-10-09 LAB — COMPREHENSIVE METABOLIC PANEL
ALT: 31 IU/L (ref 0–44)
AST: 23 IU/L (ref 0–40)
Albumin/Globulin Ratio: 1.9 (ref 1.2–2.2)
Albumin: 4.5 g/dL (ref 3.5–5.5)
Alkaline Phosphatase: 58 IU/L (ref 39–117)
BUN/Creatinine Ratio: 18 (ref 9–20)
BUN: 16 mg/dL (ref 6–24)
Bilirubin Total: 0.7 mg/dL (ref 0.0–1.2)
CO2: 23 mmol/L (ref 18–29)
Calcium: 9.4 mg/dL (ref 8.7–10.2)
Chloride: 100 mmol/L (ref 96–106)
Creatinine, Ser: 0.87 mg/dL (ref 0.76–1.27)
GFR calc Af Amer: 109 mL/min/{1.73_m2} (ref >59)
GFR calc non Af Amer: 94 mL/min/{1.73_m2} (ref >59)
Globulin, Total: 2.4 g/dL (ref 1.5–4.5)
Glucose: 101 mg/dL — ABNORMAL HIGH (ref 65–99)
Potassium: 4.4 mmol/L (ref 3.5–5.2)
Sodium: 141 mmol/L (ref 134–144)
Total Protein: 6.9 g/dL (ref 6.0–8.5)

## 2015-10-09 LAB — CBC
Hematocrit: 46.5 % (ref 37.5–51.0)
Hemoglobin: 15.7 g/dL (ref 12.6–17.7)
MCH: 30.1 pg (ref 26.6–33.0)
MCHC: 33.8 g/dL (ref 31.5–35.7)
MCV: 89 fL (ref 79–97)
Platelets: 266 10*3/uL (ref 150–379)
RBC: 5.22 x10E6/uL (ref 4.14–5.80)
RDW: 13.7 % (ref 12.3–15.4)
WBC: 10.4 10*3/uL (ref 3.4–10.8)

## 2015-10-09 LAB — PSA: Prostate Specific Ag, Serum: 1.3 ng/mL (ref 0.0–4.0)

## 2015-10-09 LAB — LIPID PANEL
Chol/HDL Ratio: 4.6 ratio units (ref 0.0–5.0)
Cholesterol, Total: 176 mg/dL (ref 100–199)
HDL: 38 mg/dL — ABNORMAL LOW (ref >39)
LDL Calculated: 99 mg/dL (ref 0–99)
Triglycerides: 195 mg/dL — ABNORMAL HIGH (ref 0–149)
VLDL Cholesterol Cal: 39 mg/dL (ref 5–40)

## 2015-10-09 LAB — TESTOSTERONE,FREE AND TOTAL
Testosterone, Free: 34.6 pg/mL — ABNORMAL HIGH (ref 7.2–24.0)
Testosterone: 1245 ng/dL — ABNORMAL HIGH (ref 348–1197)

## 2015-10-09 LAB — TSH: TSH: 3.11 u[IU]/mL (ref 0.450–4.500)

## 2015-10-09 NOTE — Telephone Encounter (Signed)
Patient's wife was notified of results. Expressed understanding. 

## 2015-10-09 NOTE — Telephone Encounter (Signed)
Please advise? Patient wanted clarification on medication changes from yesterday's ov. (Bp medication)

## 2015-10-09 NOTE — Telephone Encounter (Signed)
Pt's wife would like to know how to taper medication.  She stated he husband was confused on how to manage his medication after yesterday's visit.

## 2015-10-09 NOTE — Telephone Encounter (Signed)
Tyler Roberts also stated that patient is not taking amlodipine due to having a reaction to it a few years ago.

## 2015-10-09 NOTE — Telephone Encounter (Signed)
He is to reduce metoprolol to 1/2 tablet daily because i think metoprolol is making him tired, and he is to add benazepril 10mg  a day. He is to continue taking amlodipine once a day and follow up in about 3 months.

## 2015-10-10 ENCOUNTER — Telehealth: Payer: Self-pay

## 2015-10-10 ENCOUNTER — Other Ambulatory Visit: Payer: Self-pay | Admitting: Family Medicine

## 2015-10-10 NOTE — Telephone Encounter (Signed)
Patient wife Freda Munro called reporting that patient took the 1st dose of Lotensin last night. This morning he woke up with a rash on this torso. The rash is red with small bumps. Patient denies any itching or pain from the rash. Freda Munro thinks this is a reaction to the Lotensin, and wants to know what they should do. Patient denies any shortness of breath, swelling of the throat, chest pain, headache, lightheadedness or dizziness. Call Freda Munro back at [phone]

## 2015-10-10 NOTE — Telephone Encounter (Signed)
Mrs. Tyler Roberts advised as below. They are wanting to clarify if pt is to continue Metoprolol succinate 25 mg 1/2 tablet in the morning with the Valsartan 80 mg at night. Please advised. sd  Mrs. Tyler Roberts also concerned about amlodipine still being on patient's medication list. Mrs. Tyler Roberts reports that [name] had an allergic reaction to amlodipine in 2008.

## 2015-10-10 NOTE — Telephone Encounter (Signed)
He an take a benadryl for rash. Once rash is gone he should change to valsartan 80mg  once a day, #30, rf x 3

## 2015-10-10 NOTE — Telephone Encounter (Signed)
Yes, he is supposed to take 1/2 tablet metoprol in the morning and take the Valsartan at night. Please clarify what type of reaction he had to amlodipine and update his allergy list. Thanks.

## 2015-10-10 NOTE — Telephone Encounter (Signed)
Tyler Roberts was notified. Expressed understanding. Patient had facial swelling from amlodipine.

## 2015-10-15 ENCOUNTER — Telehealth: Payer: Self-pay | Admitting: Emergency Medicine

## 2015-10-15 DIAGNOSIS — I10 Essential (primary) hypertension: Secondary | ICD-10-CM

## 2015-10-15 MED ORDER — VALSARTAN 80 MG PO TABS: 80.0000 mg | ORAL_TABLET | Freq: Every day | ORAL | Status: DC

## 2015-10-15 NOTE — Telephone Encounter (Signed)
Patient's wife Tyler Roberts called saying that the Valsartan was never called into the pharmacy. Patient has went without Valsartan for 5 days, and she has been giving the patient a full tablet of Metoprolol instead of 1/2 tablet. Valsartan was sent into the patient's pharmacy. Patient's wife wanted you to be aware of this.

## 2015-10-15 NOTE — Telephone Encounter (Signed)
Pt daughter called and wanted to speak with nurse about pt medication.

## 2015-10-20 ENCOUNTER — Other Ambulatory Visit: Payer: Self-pay | Admitting: Family Medicine

## 2015-10-20 DIAGNOSIS — K219 Gastro-esophageal reflux disease without esophagitis: Secondary | ICD-10-CM

## 2015-10-20 DIAGNOSIS — E781 Pure hyperglyceridemia: Secondary | ICD-10-CM

## 2015-10-20 DIAGNOSIS — I10 Essential (primary) hypertension: Secondary | ICD-10-CM

## 2015-10-21 ENCOUNTER — Other Ambulatory Visit: Payer: Self-pay | Admitting: *Deleted

## 2015-10-21 MED ORDER — TESTOSTERONE 10 MG/ACT (2%) TD GEL: 8.0000 | Freq: Every day | TRANSDERMAL | Status: DC

## 2015-10-21 NOTE — Telephone Encounter (Signed)
Rx called in to pharmacy. 

## 2015-10-21 NOTE — Telephone Encounter (Signed)
Please call in testosterone.  

## 2015-12-13 ENCOUNTER — Other Ambulatory Visit: Payer: Self-pay | Admitting: Family Medicine

## 2016-01-08 ENCOUNTER — Other Ambulatory Visit: Payer: Self-pay | Admitting: Family Medicine

## 2016-01-28 ENCOUNTER — Other Ambulatory Visit: Payer: Self-pay

## 2016-01-28 ENCOUNTER — Telehealth: Payer: Self-pay | Admitting: Family Medicine

## 2016-01-28 DIAGNOSIS — E291 Testicular hypofunction: Secondary | ICD-10-CM

## 2016-01-28 NOTE — Telephone Encounter (Signed)
Please advise patient it is time to check free and total testosterone levels since it was high when last checked in July. Please leave order for him to pick up at front desk.

## 2016-01-28 NOTE — Telephone Encounter (Signed)
-----   Message from [name], MD sent at 01/14/2016  8:04 AM EDT ----- Regarding: FW: follow up o.v. and testosterone levels early October.    ----- Message ----- From: [name], MD Sent: 12/29/2015 To: [name], MD Subject: follow up o.v. and testosterone levels early#

## 2016-01-28 NOTE — Telephone Encounter (Signed)
Patient advised and states he will get labs done the end of this week. Lab slip created and placed up front for pick up.

## 2016-02-11 ENCOUNTER — Encounter: Payer: Self-pay | Admitting: Family Medicine

## 2016-04-29 ENCOUNTER — Other Ambulatory Visit: Payer: Self-pay | Admitting: Family Medicine

## 2016-04-29 DIAGNOSIS — K219 Gastro-esophageal reflux disease without esophagitis: Secondary | ICD-10-CM

## 2016-04-29 DIAGNOSIS — E781 Pure hyperglyceridemia: Secondary | ICD-10-CM

## 2016-05-10 ENCOUNTER — Other Ambulatory Visit: Payer: Self-pay | Admitting: Family Medicine

## 2016-05-12 ENCOUNTER — Other Ambulatory Visit: Payer: Self-pay | Admitting: Family Medicine

## 2016-05-12 NOTE — Telephone Encounter (Signed)
Please call in testosteron

## 2016-05-13 NOTE — Telephone Encounter (Signed)
Called in. Tyler Roberts, CMA  

## 2016-06-04 ENCOUNTER — Other Ambulatory Visit: Payer: Self-pay | Admitting: Specialist

## 2016-06-04 DIAGNOSIS — M4696 Unspecified inflammatory spondylopathy, lumbar region: Secondary | ICD-10-CM

## 2016-06-17 ENCOUNTER — Ambulatory Visit
Admission: RE | Admit: 2016-06-17 | Discharge: 2016-06-17 | Disposition: A | Discharge status: Home / Self Care | Payer: BC Managed Care – PPO | Source: Ambulatory Visit | Attending: Specialist | Admitting: Specialist

## 2016-06-17 DIAGNOSIS — M4306 Spondylolysis, lumbar region: Secondary | ICD-10-CM | POA: Insufficient documentation

## 2016-06-17 DIAGNOSIS — M5126 Other intervertebral disc displacement, lumbar region: Secondary | ICD-10-CM | POA: Insufficient documentation

## 2016-06-17 DIAGNOSIS — M5127 Other intervertebral disc displacement, lumbosacral region: Secondary | ICD-10-CM | POA: Diagnosis not present

## 2016-06-17 DIAGNOSIS — M48061 Spinal stenosis, lumbar region without neurogenic claudication: Secondary | ICD-10-CM | POA: Diagnosis not present

## 2016-06-17 DIAGNOSIS — M4696 Unspecified inflammatory spondylopathy, lumbar region: Secondary | ICD-10-CM | POA: Diagnosis present

## 2016-06-17 DIAGNOSIS — M5136 Other intervertebral disc degeneration, lumbar region: Secondary | ICD-10-CM | POA: Diagnosis not present

## 2016-07-31 ENCOUNTER — Other Ambulatory Visit: Payer: Self-pay | Admitting: Family Medicine

## 2016-09-16 ENCOUNTER — Other Ambulatory Visit: Payer: Self-pay | Admitting: Family Medicine

## 2016-09-16 DIAGNOSIS — I10 Essential (primary) hypertension: Secondary | ICD-10-CM

## 2016-10-14 ENCOUNTER — Other Ambulatory Visit: Payer: Self-pay | Admitting: Family Medicine

## 2016-10-14 DIAGNOSIS — E781 Pure hyperglyceridemia: Secondary | ICD-10-CM

## 2016-10-22 ENCOUNTER — Other Ambulatory Visit: Payer: Self-pay | Admitting: Family Medicine

## 2016-10-22 DIAGNOSIS — K219 Gastro-esophageal reflux disease without esophagitis: Secondary | ICD-10-CM

## 2016-11-13 ENCOUNTER — Other Ambulatory Visit: Payer: Self-pay | Admitting: Family Medicine

## 2016-11-13 DIAGNOSIS — E781 Pure hyperglyceridemia: Secondary | ICD-10-CM

## 2016-11-20 ENCOUNTER — Other Ambulatory Visit: Payer: Self-pay | Admitting: Family Medicine

## 2016-11-29 ENCOUNTER — Other Ambulatory Visit: Payer: Self-pay | Admitting: Family Medicine

## 2016-11-30 NOTE — Telephone Encounter (Signed)
Please call in testosterone. .  Patient is overdue for follow up, needs to schedule o.v. Or CPE

## 2016-12-01 NOTE — Telephone Encounter (Signed)
Left detailed message on pt's vm advising he cb to schedule appt.

## 2016-12-01 NOTE — Telephone Encounter (Signed)
Rx called in to pharmacy. 

## 2016-12-14 ENCOUNTER — Encounter: Payer: BC Managed Care – PPO | Admitting: Family Medicine

## 2016-12-15 ENCOUNTER — Other Ambulatory Visit: Payer: Self-pay | Admitting: Family Medicine

## 2016-12-15 ENCOUNTER — Encounter: Payer: Self-pay | Admitting: Family Medicine

## 2016-12-15 ENCOUNTER — Ambulatory Visit (INDEPENDENT_AMBULATORY_CARE_PROVIDER_SITE_OTHER): Payer: BC Managed Care – PPO | Admitting: Family Medicine

## 2016-12-15 VITALS — BP 142/84 | HR 84 | Temp 98.1°F | Resp 16 | Ht 70.0 in | Wt 186.0 lb

## 2016-12-15 DIAGNOSIS — Z125 Encounter for screening for malignant neoplasm of prostate: Secondary | ICD-10-CM | POA: Diagnosis not present

## 2016-12-15 DIAGNOSIS — E291 Testicular hypofunction: Secondary | ICD-10-CM

## 2016-12-15 DIAGNOSIS — I10 Essential (primary) hypertension: Secondary | ICD-10-CM

## 2016-12-15 DIAGNOSIS — Z23 Encounter for immunization: Secondary | ICD-10-CM | POA: Diagnosis not present

## 2016-12-15 DIAGNOSIS — K219 Gastro-esophageal reflux disease without esophagitis: Secondary | ICD-10-CM

## 2016-12-15 DIAGNOSIS — G4733 Obstructive sleep apnea (adult) (pediatric): Secondary | ICD-10-CM

## 2016-12-15 DIAGNOSIS — Z0000 Encounter for general adult medical examination without abnormal findings: Secondary | ICD-10-CM | POA: Diagnosis not present

## 2016-12-15 DIAGNOSIS — E781 Pure hyperglyceridemia: Secondary | ICD-10-CM

## 2016-12-15 MED ORDER — METOPROLOL SUCCINATE ER 25 MG PO TB24: 12.5000 mg | ORAL_TABLET | Freq: Every day | ORAL | 1 refills | Status: DC

## 2016-12-15 MED ORDER — IRBESARTAN-HYDROCHLOROTHIAZIDE 150-12.5 MG PO TABS: 1.0000 | ORAL_TABLET | Freq: Every day | ORAL | 3 refills | Status: DC

## 2016-12-15 NOTE — Patient Instructions (Signed)
  Medications Discontinued During This Encounter  Medication Reason  . valsartan (DIOVAN) 80 MG tablet     Meds ordered this encounter  Medications  . irbesartan-hydrochlorothiazide (AVALIDE) 150-12.5 MG tablet    Sig: Take 1 tablet by mouth daily.    Dispense:  30 tablet    Refill:  3    Adult vaccines due  Topic Date Due  . TETANUS/TDAP  07/24/2017

## 2016-12-15 NOTE — Progress Notes (Signed)
Patient: Tyler Roberts, Male    DOB: 27-Apr-1955, 61 y.o.   MRN: [id number] Visit Date: 12/15/2016  Today's Provider: Lelon Huh, MD   No chief complaint on file.  Subjective:    Annual physical exam Tyler Roberts is a 61 y.o. male who presents today for health maintenance and complete physical. He feels well. He reports exercising occasionally. He reports he is sleeping well.  Colonoscopy- 08/30/2007. Normal.  Tdap- 07/25/2007.   Hypertension, follow-up:  BP Readings from Last 3 Encounters:  10/08/15 (!) 162/82  06/02/15 (!) 160/90  09/27/14 120/80    He was last seen for hypertension 1 years ago.  BP at that visit was 162/82. Management since that visit includes starting Valsartan 80mg  daily. He is also taking 1/2 tablet of Metoprolol. Since last July when it was reduced due to fatigue.  He reports good compliance with treatment. He is not having side effects.  He is exercising. He is adherent to low salt diet.   Outside blood pressures are checked occasionally. He is experiencing none.  Patient denies exertional chest pressure/discomfort, lower extremity edema and palpitations.   Cardiovascular risk factors include dyslipidemia.     Weight trend: stable Wt Readings from Last 3 Encounters:  10/08/15 190 lb (86.2 kg)  06/02/15 192 lb (87.1 kg)  09/27/14 187 lb (84.8 kg)    Current diet: well balanced    Hyperglycemia, Follow-up:   Lab Results  Component Value Date   GLUCOSE 101 (H) 10/08/2015   GLUCOSE 108 (H) 09/27/2014    Last seen for for this 1 years ago.  Management since then includes no changes. Current symptoms include none and have been stable.  Weight trend: stable Prior visit with dietician: no Current diet: well balanced Current exercise: yard work  Pertinent Labs:    Component Value Date/Time   CHOL 176 10/08/2015 0949   TRIG 195 (H) 10/08/2015 0949   CHOLHDL 4.6 10/08/2015 0949   CREATININE 0.87 10/08/2015 0949    Wt  Readings from Last 3 Encounters:  10/08/15 190 lb (86.2 kg)  06/02/15 192 lb (87.1 kg)  09/27/14 187 lb (84.8 kg)    Testicular hypofunction, follow up: Patient was last seen in the office 1 year ago. Labs on 10/08/2015 indicated that testosterone levels were high. Testosterone was decreased to 6 pumps daily.   Using CPAP every night working well, tolerating well. Feels better   Review of Systems  Constitutional: Negative.   HENT: Negative.   Eyes: Negative.   Respiratory: Negative.   Cardiovascular: Negative.   Gastrointestinal: Negative.   Endocrine: Negative.   Genitourinary: Negative.   Musculoskeletal: Negative.   Skin: Negative.   Allergic/Immunologic: Negative.   Neurological: Negative.   Hematological: Negative.   Psychiatric/Behavioral: Negative.     Social History      He  reports that he has been smoking Cigars.  He has never used smokeless tobacco. He reports that he drinks alcohol. He reports that he does not use drugs.       Social History   Social History  . Marital status: Married    Spouse name: N/A  . Number of children: 1  . Years of education: H/S   Occupational History  . Scientist, forensic   Social History Main Topics  . Smoking status: Current Some Day Smoker    Types: Cigars  . Smokeless tobacco: Never Used  . Alcohol use 0.0 oz/week  Comment: Occasional  . Drug use: No  . Sexual activity: Not on file   Other Topics Concern  . Not on file   Social History Narrative  . No narrative on file    Past Medical History:  Diagnosis Date  . Shingles 09/26/2014     Patient Active Problem List   Diagnosis Date Noted  . Erectile dysfunction 10/08/2015  . LBP (low back pain) 09/26/2014  . Allergic rhinitis 09/28/2009  . 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 Benign essential hypertension 09/23/2009  . Fam hx-ischem heart disease 09/05/2006  . Testicular hypofunction 09/05/2006  .  Obstructive apnea 03/03/2006  . Hyperglyceridemia, pure 09/13/2005  . Acid reflux 04/06/2003    Past Surgical History:  Procedure Laterality Date  . HERNIA REPAIR  1990  . KNEE SURGERY Right 2008   Due to a staph infection  . LASIK    . NOSE SURGERY  2002  . TONSILLECTOMY AND ADENOIDECTOMY  2002    Family History        Family Status  Relation Status  . Mother Deceased  . Father Deceased  . Sister Deceased  . Brother Alive  . Sister Alive  . Sister Alive        His family history includes Anuerysm in his mother; Healthy in his brother, sister, and sister; Heart attack in his father; Heart disease (age of onset: 52's) in his father; Ovarian cancer in his sister.     Allergies  Allergen Reactions  . Amlodipine Swelling    Facial swelling  . Fenofibrate     Other reaction(s): Muscle Pain Elevated CK  . Gemfibrozil     Other reaction(s): Muscle Pain  . Niacin     Flushing  . Lotensin [Benazepril] Rash     Current Outpatient Prescriptions:  .  aspirin 81 MG tablet, , Disp: , Rfl:  .  esomeprazole (NEXIUM) 40 MG capsule, TAKE 1 CAPSULE BY MOUTH EVERY DAY, Disp: 30 capsule, Rfl: 12 .  flunisolide (NASALIDE) 25 MCG/ACT (0.025%) SOLN, USE 1 SPRAY IN EACH NOSTRIL DAILY, Disp: 25 mL, Rfl: 1 .  lovastatin (MEVACOR) 20 MG tablet, TAKE ONE TABLET BY MOUTH EVERY DAY, Disp: 30 tablet, Rfl: 0 .  metoprolol succinate (TOPROL-XL) 25 MG 24 hr tablet, TAKE 1/2 TABLET BY MOUTH EVERY DAY, Disp: 30 tablet, Rfl: 12 .  sildenafil (VIAGRA) 50 MG tablet, Take 1-2 tablets (50-100 mg total) by mouth daily as needed for erectile dysfunction (take 30 minute prior to intercourse)., Disp: 10 tablet, Rfl: 0 .  Testosterone 10 MG/ACT (2%) GEL, APPLY 8 PUMPS PER DAY (Patient taking differently: APPLY 6 PUMPS PER DAY), Disp: 120 g, Rfl: 0 .  valsartan (DIOVAN) 160 MG tablet, TAKE 1/2 TABLET BY MOUTH EVERY DAY, Disp: 30 tablet, Rfl: 3   Patient Care Team: [name], MD as PCP - General (Family  Medicine) Brendolyn Patty, MD (Dermatology)      Objective:   Vitals: BP (!) 142/84 (BP Location: Right Arm, Patient Position: Sitting, Cuff Size: Normal)   Pulse 84   Temp 98.1 F (36.7 C)   Resp 16   Ht 5\' 10"  (1.778 m)   Wt 186 lb (84.4 kg)   SpO2 95%   BMI 26.69 kg/m    Vitals:   12/15/16 0915  BP: (!) 142/84  Pulse: 84  Resp: 16  Temp: 98.1 F (36.7 C)  SpO2: 95%  Weight: 186 lb (84.4 kg)  Height: 5\' 10"  (1.778 m)  Physical Exam   General Appearance:    Alert, cooperative, no distress, appears stated age  Head:    Normocephalic, without obvious abnormality, atraumatic  Eyes:    PERRL, conjunctiva/corneas clear, EOM's intact, fundi    benign, both eyes       Ears:    Normal TM's and external ear canals, both ears  Nose:   Nares normal, septum midline, mucosa normal, no drainage   or sinus tenderness  Throat:   Lips, mucosa, and tongue normal; teeth and gums normal  Neck:   Supple, symmetrical, trachea midline, no adenopathy;       thyroid:  No enlargement/tenderness/nodules; no carotid   bruit or JVD  Back:     Symmetric, no curvature, ROM normal, no CVA tenderness  Lungs:     Clear to auscultation bilaterally, respirations unlabored  Chest wall:    No tenderness or deformity  Heart:    Regular rate and rhythm, S1 and S2 normal, no murmur, rub   or gallop  Abdomen:     Soft, non-tender, bowel sounds active all four quadrants,    no masses, no organomegaly  Genitalia:    deferred  Rectal:    deferred  Extremities:   Extremities normal, atraumatic, no cyanosis or edema  Pulses:   2+ and symmetric all extremities  Skin:   Skin color, texture, turgor normal, no rashes or lesions  Lymph nodes:   Cervical, supraclavicular, and axillary nodes normal  Neurologic:   CNII-XII intact. Normal strength, sensation and reflexes      throughout     Depression Screen PHQ 2/9 Scores 10/08/2015 09/27/2014  PHQ - 2 Score 0 0  PHQ- 9 Score 6 -      Assessment & Plan:      Routine Health Maintenance and Physical Exam  Exercise Activities and Dietary recommendations Goals    None      Immunization History  Administered Date(s) Administered  . Tdap 07/25/2007    Health Maintenance  Topic Date Due  . HIV Screening  01/03/1971  . INFLUENZA VACCINE  11/03/2016  . TETANUS/TDAP  07/24/2017  . COLONOSCOPY  08/29/2017  . Hepatitis C Screening  Completed     Discussed health benefits of physical activity, and encouraged him to engage in regular exercise appropriate for his age and condition.     1. Annual physical exam  - Comprehensive metabolic panel - CBC - Lipid panel  2. Benign essential hypertension Well controlled.  Continue current medications.   - EKG 12-Lead   3. Obstructive apnea Compliant with CPAP, using nightly with significant improvement in QOL.   4. Gastroesophageal reflux disease, esophagitis presence not specified Doing well with current dose of esomeprazole.   5. Hyperglyceridemia, pure He is tolerating lovastatin well with no adverse effects.   - Comprehensive metabolic panel - CBC - Lipid panel - TSH  6. Testicular hypofunction Doing well with current testosterone replacement.  - PSA - Testosterone Total,Free,Bio, Males  7. Need for influenza vaccination Patient refused flu vaccine today.   8. Prostate cancer screening  - PSA  9. Need for zoster vaccine  - Varicella-zoster vaccine IM (Shingrix)       Lelon Huh, MD  [facility]

## 2016-12-16 ENCOUNTER — Other Ambulatory Visit: Payer: Self-pay | Admitting: Family Medicine

## 2016-12-16 LAB — COMPLETE METABOLIC PANEL WITH GFR
AG Ratio: 2.3 (calc) (ref 1.0–2.5)
ALT: 22 U/L (ref 9–46)
AST: 17 U/L (ref 10–35)
Albumin: 4.8 g/dL (ref 3.6–5.1)
Alkaline phosphatase (APISO): 47 U/L (ref 40–115)
BUN: 14 mg/dL (ref 7–25)
CO2: 26 mmol/L (ref 20–32)
Calcium: 9.6 mg/dL (ref 8.6–10.3)
Chloride: 104 mmol/L (ref 98–110)
Creat: 0.89 mg/dL (ref 0.70–1.25)
GFR, Est African American: 108 mL/min/{1.73_m2} (ref >=60)
GFR, Est Non African American: 93 mL/min/{1.73_m2} (ref >=60)
Globulin: 2.1 g/dL (calc) (ref 1.9–3.7)
Glucose, Bld: 105 mg/dL — ABNORMAL HIGH (ref 65–99)
Potassium: 4.1 mmol/L (ref 3.5–5.3)
Sodium: 140 mmol/L (ref 135–146)
Total Bilirubin: 1.3 mg/dL — ABNORMAL HIGH (ref 0.2–1.2)
Total Protein: 6.9 g/dL (ref 6.1–8.1)

## 2016-12-16 LAB — CBC
HCT: 46.5 % (ref 38.5–50.0)
Hemoglobin: 15.8 g/dL (ref 13.2–17.1)
MCH: 30.4 pg (ref 27.0–33.0)
MCHC: 34 g/dL (ref 32.0–36.0)
MCV: 89.4 fL (ref 80.0–100.0)
MPV: 9.7 fL (ref 7.5–12.5)
Platelets: 271 10*3/uL (ref 140–400)
RBC: 5.2 10*6/uL (ref 4.20–5.80)
RDW: 13.2 % (ref 11.0–15.0)
WBC: 6.3 10*3/uL (ref 3.8–10.8)

## 2016-12-16 LAB — TESTOSTERONE TOTAL,FREE,BIO, MALES
Albumin: 4.8 g/dL (ref 3.6–5.1)
Sex Hormone Binding: 19 nmol/L — ABNORMAL LOW (ref 22–77)
Testosterone, Bioavailable: 531.7 ng/dL (ref >=110.0)
Testosterone, Free: 243.1 pg/mL — ABNORMAL HIGH (ref 46.0–224.0)
Testosterone: 1033 ng/dL — ABNORMAL HIGH (ref 250–827)

## 2016-12-16 LAB — TSH: TSH: 1.78 mIU/L (ref 0.40–4.50)

## 2016-12-16 LAB — LIPID PANEL
Cholesterol: 167 mg/dL (ref <200)
HDL: 41 mg/dL (ref >40)
LDL Cholesterol (Calc): 96 mg/dL (calc)
Non-HDL Cholesterol (Calc): 126 mg/dL (calc) (ref <130)
Total CHOL/HDL Ratio: 4.1 (calc) (ref <5.0)
Triglycerides: 210 mg/dL — ABNORMAL HIGH (ref <150)

## 2016-12-16 LAB — PSA: PSA: 0.8 ng/mL (ref <=4.0)

## 2016-12-16 MED ORDER — TESTOSTERONE 10 MG/ACT (2%) TD GEL: TRANSDERMAL | 0 refills | Status: DC

## 2016-12-18 MED ORDER — METOPROLOL SUCCINATE ER 25 MG PO TB24: 12.5000 mg | ORAL_TABLET | Freq: Every day | ORAL | 1 refills | Status: DC

## 2016-12-22 ENCOUNTER — Encounter: Payer: Self-pay | Admitting: Family Medicine

## 2017-01-12 ENCOUNTER — Other Ambulatory Visit: Payer: Self-pay | Admitting: Family Medicine

## 2017-01-12 DIAGNOSIS — E781 Pure hyperglyceridemia: Secondary | ICD-10-CM

## 2017-01-26 ENCOUNTER — Ambulatory Visit: Payer: Self-pay | Admitting: Family Medicine

## 2017-01-26 NOTE — Progress Notes (Deleted)
       Patient: Tyler Roberts Male    DOB: [birth year]   61 y.o.   MRN: [id number] Visit Date: 01/26/2017  Today's Provider: Lelon Huh, MD   No chief complaint on file.  Subjective:    HPI  Testicular hypofunction From 12/15/2016-labs checked. Showing Testosterone levels are still high, reduced testosterone gel to 4 gelpumps daily (instead of 6).     Allergies  Allergen Reactions  . Amlodipine Swelling    Facial swelling  . Fenofibrate     Other reaction(s): Muscle Pain Elevated CK  . Gemfibrozil     Other reaction(s): Muscle Pain  . Niacin     Flushing  . Lotensin [Benazepril] Rash     Current Outpatient Prescriptions:  .  aspirin 81 MG tablet, , Disp: , Rfl:  .  esomeprazole (NEXIUM) 40 MG capsule, TAKE 1 CAPSULE BY MOUTH EVERY DAY, Disp: 30 capsule, Rfl: 12 .  flunisolide (NASALIDE) 25 MCG/ACT (0.025%) SOLN, USE 1 SPRAY IN EACH NOSTRIL DAILY, Disp: 25 mL, Rfl: 1 .  irbesartan-hydrochlorothiazide (AVALIDE) 150-12.5 MG tablet, Take 1 tablet by mouth daily., Disp: 30 tablet, Rfl: 3 .  lovastatin (MEVACOR) 20 MG tablet, TAKE ONE TABLET EVERY DAY, Disp: 30 tablet, Rfl: 5 .  metoprolol succinate (TOPROL-XL) 25 MG 24 hr tablet, Take 0.5 tablets (12.5 mg total) by mouth daily., Disp: 1 tablet, Rfl: 1 .  sildenafil (VIAGRA) 50 MG tablet, Take 1-2 tablets (50-100 mg total) by mouth daily as needed for erectile dysfunction (take 30 minute prior to intercourse)., Disp: 10 tablet, Rfl: 0 .  Testosterone 10 MG/ACT (2%) GEL, Apply 4 gel pumps daily, Disp: 1 g, Rfl: 0 .  valsartan (DIOVAN) 80 MG tablet, TAKE ONE TABLET BY MOUTH EVERY DAY, Disp: 30 tablet, Rfl: 5  Review of Systems  Constitutional: Negative for appetite change, chills and fever.  Respiratory: Negative for chest tightness, shortness of breath and wheezing.   Cardiovascular: Negative for chest pain and palpitations.  Gastrointestinal: Negative for abdominal pain, nausea and vomiting.    Social History    Substance Use Topics  . Smoking status: Current Some Day Smoker    Types: Cigars  . Smokeless tobacco: Never Used  . Alcohol use 0.0 oz/week     Comment: Occasional   Objective:   There were no vitals taken for this visit. There were no vitals filed for this visit.   Physical Exam      Assessment & Plan:           Lelon Huh, MD  [facility]

## 2017-02-16 ENCOUNTER — Other Ambulatory Visit: Payer: Self-pay

## 2017-02-16 ENCOUNTER — Encounter: Payer: Self-pay | Admitting: Family Medicine

## 2017-02-16 ENCOUNTER — Ambulatory Visit: Payer: BC Managed Care – PPO | Admitting: Family Medicine

## 2017-02-16 ENCOUNTER — Other Ambulatory Visit: Payer: Self-pay | Admitting: Family Medicine

## 2017-02-16 VITALS — BP 120/68 | HR 77 | Temp 98.1°F | Resp 16 | Ht 70.0 in | Wt 190.0 lb

## 2017-02-16 DIAGNOSIS — Z23 Encounter for immunization: Secondary | ICD-10-CM

## 2017-02-16 DIAGNOSIS — E291 Testicular hypofunction: Secondary | ICD-10-CM

## 2017-02-16 DIAGNOSIS — I10 Essential (primary) hypertension: Secondary | ICD-10-CM | POA: Diagnosis not present

## 2017-02-16 NOTE — Progress Notes (Signed)
Patient: Tyler Roberts Male    DOB: 07-28-1955   61 y.o.   MRN: [id number] Visit Date: 02/16/2017  Today's Provider: Lelon Huh, MD   Chief Complaint  Patient presents with  . Follow-up  . Hypertension   Subjective:    HPI   Hypertension, follow-up:  BP Readings from Last 3 Encounters:  02/16/17 120/68  12/15/16 (!) 142/84  10/08/15 (!) 162/82    He was last seen for hypertension 2 months ago.  BP at that visit was 142/84. Management since that visit includes; discontinued Valsartan. Changed to Irbesartan-HCTZ 150-12.5 mg qd.He reports good compliance with treatment. He is not having side effects. none He is exercising. He is adherent to low salt diet.   Outside blood pressures are not checking. He is experiencing none.  Patient denies none.   Cardiovascular risk factors include none.  Use of agents associated with hypertension: none.   ----------------------------------------------------------------  Also needs follow up low testosterone since reducing dose to four gel pumps daily 2 months ago. He states he feels well and hasn't noticed any change since dose was reduced.  Lab Results  Component Value Date   TESTOSTERONE 1,033 (H) 12/15/2016     Allergies  Allergen Reactions  . Amlodipine Swelling    Facial swelling  . Fenofibrate     Other reaction(s): Muscle Pain Elevated CK  . Gemfibrozil     Other reaction(s): Muscle Pain  . Niacin     Flushing  . Lotensin [Benazepril] Rash     Current Outpatient Medications:  .  aspirin 81 MG tablet, , Disp: , Rfl:  .  esomeprazole (NEXIUM) 40 MG capsule, TAKE 1 CAPSULE BY MOUTH EVERY DAY, Disp: 30 capsule, Rfl: 12 .  flunisolide (NASALIDE) 25 MCG/ACT (0.025%) SOLN, USE 1 SPRAY INTO EACH NOSTRIL EVERY DAY, Disp: 25 mL, Rfl: 5 .  irbesartan-hydrochlorothiazide (AVALIDE) 150-12.5 MG tablet, Take 1 tablet by mouth daily., Disp: 30 tablet, Rfl: 3 .  lovastatin (MEVACOR) 20 MG tablet, TAKE ONE TABLET EVERY  DAY, Disp: 30 tablet, Rfl: 5 .  metoprolol succinate (TOPROL-XL) 25 MG 24 hr tablet, Take 0.5 tablets (12.5 mg total) by mouth daily., Disp: 1 tablet, Rfl: 1 .  sildenafil (VIAGRA) 50 MG tablet, Take 1-2 tablets (50-100 mg total) by mouth daily as needed for erectile dysfunction (take 30 minute prior to intercourse)., Disp: 10 tablet, Rfl: 0 .  Testosterone 10 MG/ACT (2%) GEL, Apply 4 gel pumps daily, Disp: 1 g, Rfl: 0 .  valsartan (DIOVAN) 80 MG tablet, TAKE ONE TABLET BY MOUTH EVERY DAY (Patient not taking: Reported on 02/16/2017), Disp: 30 tablet, Rfl: 5  Review of Systems  Constitutional: Negative for appetite change, chills and fever.  Respiratory: Negative for chest tightness, shortness of breath and wheezing.   Cardiovascular: Negative for chest pain and palpitations.  Gastrointestinal: Negative for abdominal pain, nausea and vomiting.    Social History   Tobacco Use  . Smoking status: Current Some Day Smoker    Types: Cigars  . Smokeless tobacco: Never Used  Substance Use Topics  . Alcohol use: Yes    Alcohol/week: 0.0 oz    Comment: Occasional   Objective:   BP 120/68 (BP Location: Right Arm, Patient Position: Sitting, Cuff Size: Large)   Pulse 77   Temp 98.1 F (36.7 C) (Oral)   Resp 16   Ht 5\' 10"  (1.778 m)   Wt 190 lb (86.2 kg)   SpO2 98%   BMI 27.26  kg/m  Vitals:   02/16/17 1610  BP: 120/68  Pulse: 77  Resp: 16  Temp: 98.1 F (36.7 C)  TempSrc: Oral  SpO2: 98%  Weight: 190 lb (86.2 kg)  Height: 5\' 10"  (1.778 m)     Physical Exam  General appearance: alert, well developed, well nourished, cooperative and in no distress Head: Normocephalic, without obvious abnormality, atraumatic Respiratory: Respirations even and unlabored, normal respiratory rate Extremities: No gross deformities Skin: Skin color, texture, turgor normal. No rashes seen  Psych: Appropriate mood and affect. Neurologic: Mental status: Alert, oriented to person, place, and time,  thought content appropriate.     Assessment & Plan:     1. Benign essential hypertension Well controlled with change to irbesartan-hctz. Continue current medications.   - Renal function panel - Magnesium  2. Testicular hypofunction Doing well with reduced dose of testosterone.  - Testosterone  3. Need for shingles vaccine  - Varicella-zoster vaccine IM (Shingrix) #2.   The entirety of the information documented in the History of Present Illness, Review of Systems and Physical Exam were personally obtained by me. Portions of this information were initially documented by April M. Sabra Heck, CMA and reviewed by me for thoroughness and accuracy.        Lelon Huh, MD  [facility]

## 2017-02-17 LAB — TESTOSTERONE: Testosterone: 312 ng/dL (ref 250–827)

## 2017-02-17 LAB — RENAL FUNCTION PANEL
Albumin: 4.8 g/dL (ref 3.6–5.1)
BUN: 17 mg/dL (ref 7–25)
CO2: 28 mmol/L (ref 20–32)
Calcium: 9.6 mg/dL (ref 8.6–10.3)
Chloride: 102 mmol/L (ref 98–110)
Creat: 0.82 mg/dL (ref 0.70–1.25)
Glucose, Bld: 85 mg/dL (ref 65–99)
Phosphorus: 3.8 mg/dL (ref 2.5–4.5)
Potassium: 4 mmol/L (ref 3.5–5.3)
Sodium: 140 mmol/L (ref 135–146)

## 2017-02-17 LAB — MAGNESIUM: Magnesium: 2.2 mg/dL (ref 1.5–2.5)

## 2017-03-08 ENCOUNTER — Other Ambulatory Visit: Payer: Self-pay | Admitting: Family Medicine

## 2017-03-09 ENCOUNTER — Other Ambulatory Visit: Payer: Self-pay | Admitting: Family Medicine

## 2017-03-09 NOTE — Telephone Encounter (Signed)
Pt contacted office for refill request on the following medications:  Testosterone 10 MG/ACT (2%) GEL  [facility].  CB#[phone]/MW

## 2017-04-08 ENCOUNTER — Telehealth: Payer: Self-pay

## 2017-04-08 NOTE — Telephone Encounter (Signed)
Wife, Freda Munro, called and states patient has had his CPAP machine for years and it is starting not to work well, sometimes does not even turn on. Patient discussed this on his last visit for CPE she said. She contacted sleep med and they told his wife that we need to fax order for new CPAP machine and supplies and then they will send the form to [place] and everything that needs to be filled out for this. Their fax number is [phone]. I asked wife if she knew the pressure of his CPAP and she said she thinks it is at 11 but said sleep med told her not to worry about that and that they just need the order first to get this process started. Please advise his wife. [place], RMA

## 2017-04-12 NOTE — Telephone Encounter (Signed)
Order written and sent to be faxed.

## 2017-04-25 ENCOUNTER — Other Ambulatory Visit: Payer: Self-pay | Admitting: Family Medicine

## 2017-06-24 ENCOUNTER — Other Ambulatory Visit: Payer: Self-pay | Admitting: Family Medicine

## 2017-06-24 DIAGNOSIS — E781 Pure hyperglyceridemia: Secondary | ICD-10-CM

## 2017-09-22 ENCOUNTER — Other Ambulatory Visit: Payer: Self-pay | Admitting: Family Medicine

## 2017-09-22 DIAGNOSIS — I10 Essential (primary) hypertension: Secondary | ICD-10-CM

## 2017-10-05 ENCOUNTER — Encounter: Payer: Self-pay | Admitting: Family Medicine

## 2017-10-05 ENCOUNTER — Ambulatory Visit: Payer: BC Managed Care – PPO | Admitting: Family Medicine

## 2017-10-05 VITALS — BP 148/80 | HR 90 | Temp 98.6°F | Resp 16 | Wt 189.0 lb

## 2017-10-05 DIAGNOSIS — K219 Gastro-esophageal reflux disease without esophagitis: Secondary | ICD-10-CM | POA: Diagnosis not present

## 2017-10-05 DIAGNOSIS — Z1211 Encounter for screening for malignant neoplasm of colon: Secondary | ICD-10-CM | POA: Diagnosis not present

## 2017-10-05 DIAGNOSIS — G4733 Obstructive sleep apnea (adult) (pediatric): Secondary | ICD-10-CM

## 2017-10-05 NOTE — Progress Notes (Addendum)
Patient: Tyler Roberts Male    DOB: 1955-04-19   62 y.o.   MRN: [id number] Visit Date: 10/05/2017  Today's Provider: Lelon Huh, MD   Chief Complaint  Patient presents with  . Sleep Apnea   Subjective:    HPI  Obstructive apnea He is in need of new CPAP machine as his current machine is beginning to make loud noises and is several years old. He states he uses nightly with significant improvement in QOL. He is tolerating current settings well. He thinks he is on 9 or 11cm h2o. He has been in contact with sleep med to order a new machine and needs a face to face visit. He has reports he has been using CPAP for about 10 years and never sleep without it, even when travelling. States he feels well rested when he sleeps with it. Wakes up once a night to void, and a few more times, but is able to quickly get back to sleep.  He also reports he doesn't feel like Nexium is working as well. He takes it every day, but has been having episodes of heartburn about once a week for several months.    Allergies  Allergen Reactions  . Amlodipine Swelling    Facial swelling  . Fenofibrate     Other reaction(s): Muscle Pain Elevated CK  . Gemfibrozil     Other reaction(s): Muscle Pain  . Niacin     Flushing  . Lotensin [Benazepril] Rash     Current Outpatient Medications:  .  aspirin 81 MG tablet, , Disp: , Rfl:  .  esomeprazole (NEXIUM) 40 MG capsule, TAKE 1 CAPSULE BY MOUTH EVERY DAY, Disp: 30 capsule, Rfl: 12 .  flunisolide (NASALIDE) 25 MCG/ACT (0.025%) SOLN, USE 1 SPRAY INTO EACH NOSTRIL EVERY DAY, Disp: 25 mL, Rfl: 5 .  irbesartan-hydrochlorothiazide (AVALIDE) 150-12.5 MG tablet, TAKE ONE TABLET EVERY DAY, Disp: 30 tablet, Rfl: 11 .  lovastatin (MEVACOR) 20 MG tablet, TAKE ONE TABLET EVERY DAY, Disp: 30 tablet, Rfl: 5 .  metoprolol succinate (TOPROL-XL) 25 MG 24 hr tablet, Take 0.5 tablets (12.5 mg total) by mouth daily., Disp: 30 tablet, Rfl: 1 .  sildenafil (VIAGRA) 50 MG  tablet, Take 1-2 tablets (50-100 mg total) by mouth daily as needed for erectile dysfunction (take 30 minute prior to intercourse)., Disp: 10 tablet, Rfl: 0 .  Testosterone 10 MG/ACT (2%) GEL, APPLY 4 PUMPS PER DAY AS DIRECTED, Disp: 120 g, Rfl: 2  Review of Systems  Constitutional: Negative for appetite change, chills and fever.  Respiratory: Negative for chest tightness, shortness of breath and wheezing.   Cardiovascular: Negative for chest pain and palpitations.  Gastrointestinal: Negative for abdominal pain, nausea and vomiting.    Social History   Tobacco Use  . Smoking status: Former Smoker    Types: Cigars    Last attempt to quit: 04/05/2017    Years since quitting: 0.5  . Smokeless tobacco: Never Used  . Tobacco comment: occasionally smoked intermittenlty for a few years  Substance Use Topics  . Alcohol use: Yes    Alcohol/week: 0.0 oz    Comment: Occasional   Objective:   BP (!) 148/80 (BP Location: Right Arm, Cuff Size: Large)   Pulse 90   Temp 98.6 F (37 C) (Oral)   Resp 16   Wt 189 lb (85.7 kg)   SpO2 96% Comment: room air  BMI 27.12 kg/m  Vitals:   10/05/17 1349 10/05/17 1351  BP: Marland Kitchen)  150/82 (!) 148/80  Pulse: 90   Resp: 16   Temp: 98.6 F (37 C)   TempSrc: Oral   SpO2: 96%   Weight: 189 lb (85.7 kg)      Physical Exam   General Appearance:    Alert, cooperative, no distress  Eyes:    PERRL, conjunctiva/corneas clear, EOM's intact       Lungs:     Clear to auscultation bilaterally, respirations unlabored  Heart:    Regular rate and rhythm  Neurologic:   Awake, alert, oriented x 3. No apparent focal neurological           defect.           Assessment & Plan:     1. Obstructive apnea Medically benefiting and compliant with nightly CPAP use. Is in need of new machine due to wear and tear of current machine.   2. Gastroesophageal reflux disease, esophagitis presence not specified Having breakthrough sx on esomeprazole.  - Ambulatory referral  to Gastroenterology  3. Colon cancer screening Is due for colon cancer screening this year.  - Ambulatory referral to Gastroenterology  Follow up for CPE in September.      Addendum:  Patient has been using current CPAP machine since May 2010. This machine is well over 8 years old, it is not functioning properly and is need of replacement.   Lelon Huh, MD  [facility]

## 2017-10-07 ENCOUNTER — Encounter: Payer: Self-pay | Admitting: Family Medicine

## 2017-10-07 NOTE — Telephone Encounter (Signed)
err

## 2017-10-08 ENCOUNTER — Other Ambulatory Visit: Payer: Self-pay | Admitting: Family Medicine

## 2017-10-11 ENCOUNTER — Other Ambulatory Visit: Payer: Self-pay

## 2017-10-11 MED ORDER — TESTOSTERONE 10 MG/ACT (2%) TD GEL: TRANSDERMAL | 5 refills | Status: DC

## 2017-10-11 NOTE — Telephone Encounter (Signed)
[facility] sent a fax requesting a refill on this prescription. Patient has been getting this medication filled at [facility], but now wants to switch to getting these filled at [facility]. Paper request placed on your desk to review.

## 2017-10-22 IMAGING — MR MR LUMBAR SPINE W/O CM
4 of 5 series · 32 of 48 positions shown · non-contrast
Comparison: Prior MRI from 10/12/2007.

CLINICAL DATA: Initial evaluation for low back pain, mainly right
side with right hip pain for 3-4 months.

EXAM:
MRI LUMBAR SPINE WITHOUT CONTRAST
TECHNIQUE: Multiplanar, multisequence MR imaging of the lumbar spine was
performed. No intravenous contrast was administered.

[Series 2: T2 · sagittal · 4.0mm · 0.81mm/px · 8 of 21 slices shown (1 of 2)]
[im 1/21]
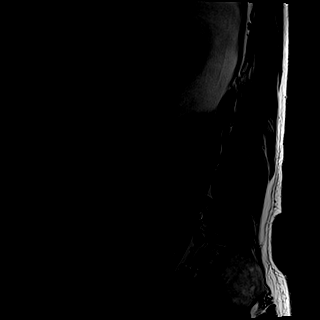
[im 3/21]
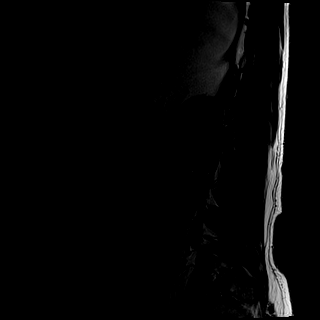
[im 6/21]
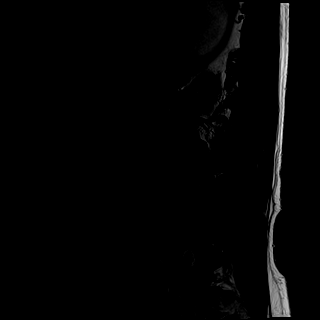
[im 9/21]
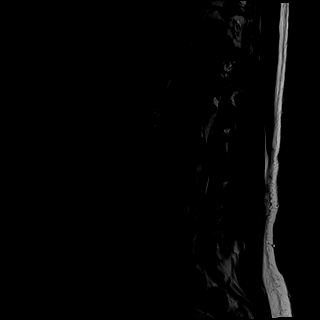
[im 12/21]
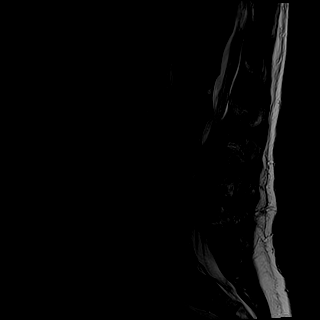
[im 15/21]
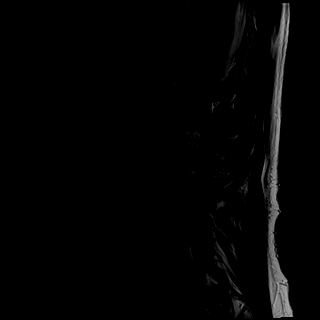
[im 18/21]
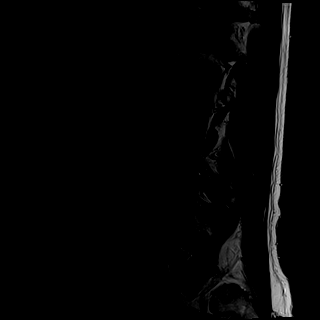
[im 21/21]
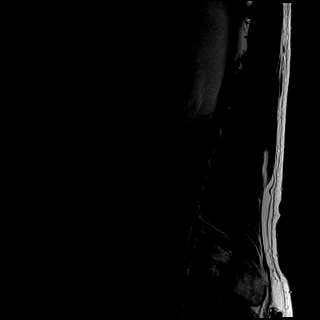

[Series 3: T1 · sagittal · 4.0mm · 0.81mm/px · 7 of 21 slices shown (1 of 2)]
[im 1/21]
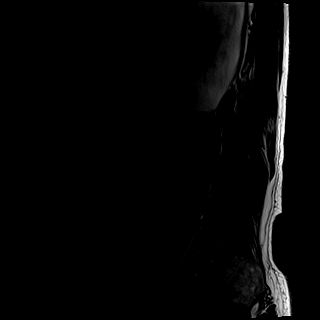
[im 4/21]
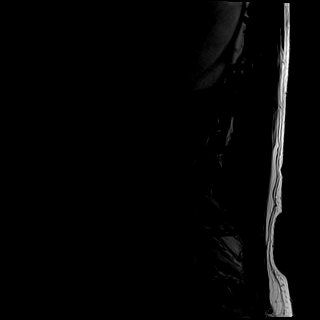
[im 7/21]
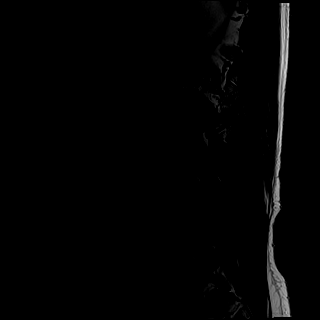
[im 11/21]
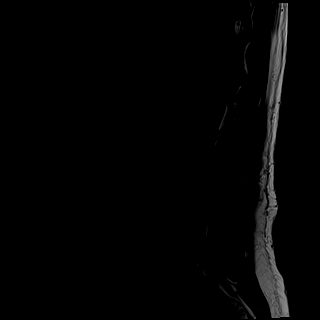
[im 14/21]
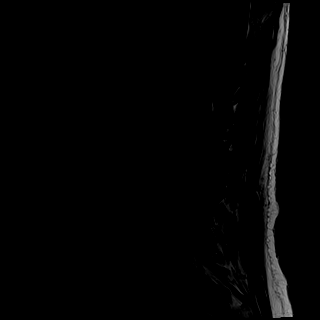
[im 17/21]
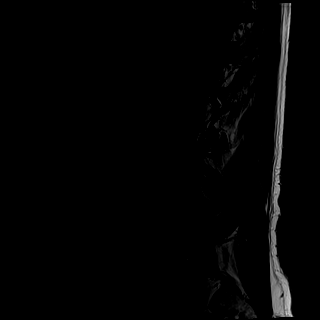
[im 21/21]
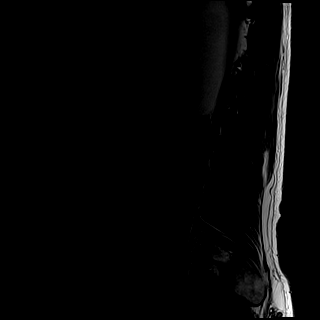

[Series 5: T2 · axial · 4.0mm · 0.78mm/px · z∈[-53,+157]mm · 9 of 40 slices shown (2 of 2)]
[im 1/40]
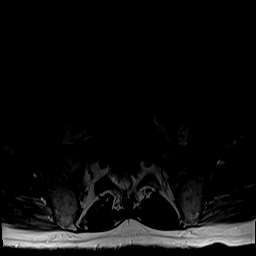
[im 7/40]
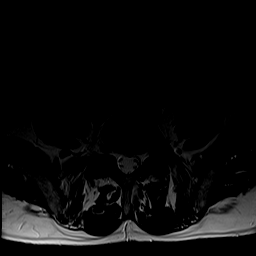
[im 14/40]
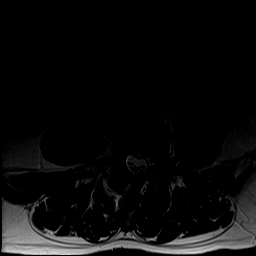
[im 17/40]
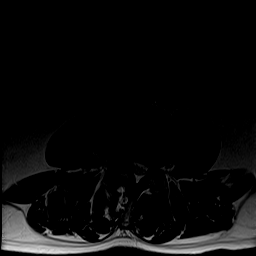
[im 20/40]
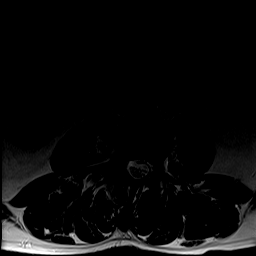
[im 23/40]
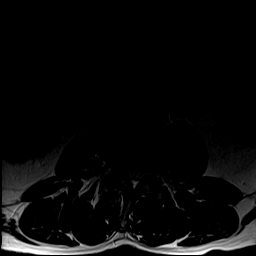
[im 27/40]
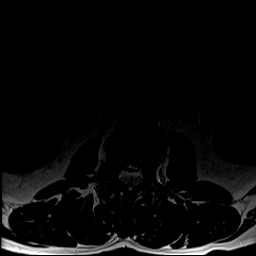
[im 33/40]
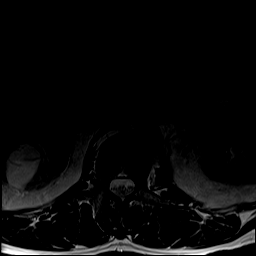
[im 40/40]
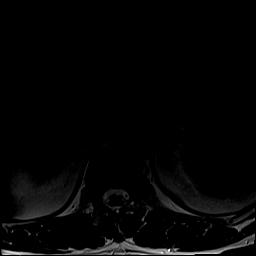

[Series 6: T1 · axial · 4.0mm · 0.39mm/px · z∈[-53,+122]mm · 8 of 40 slices shown (2 of 2)]
[im 1/40]
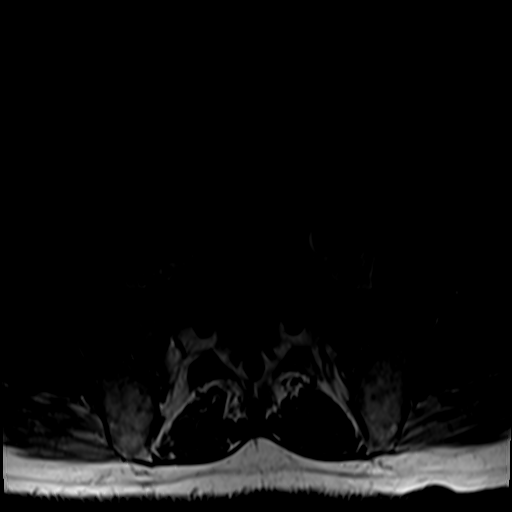
[im 7/40]
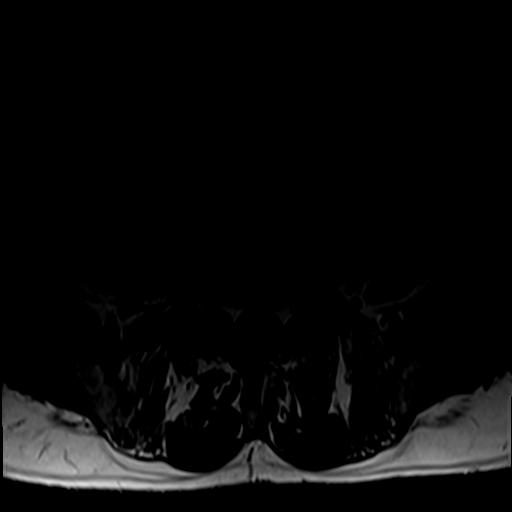
[im 14/40]
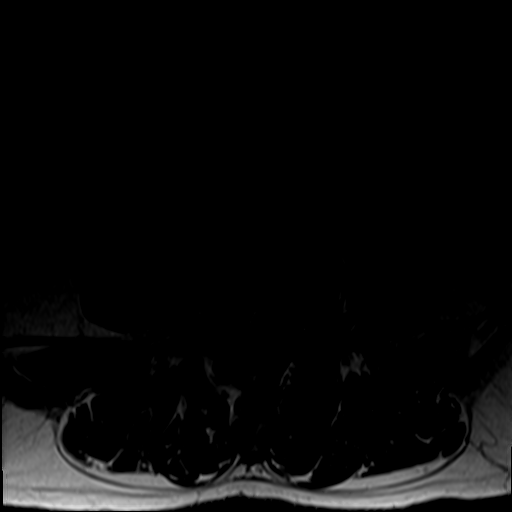
[im 17/40]
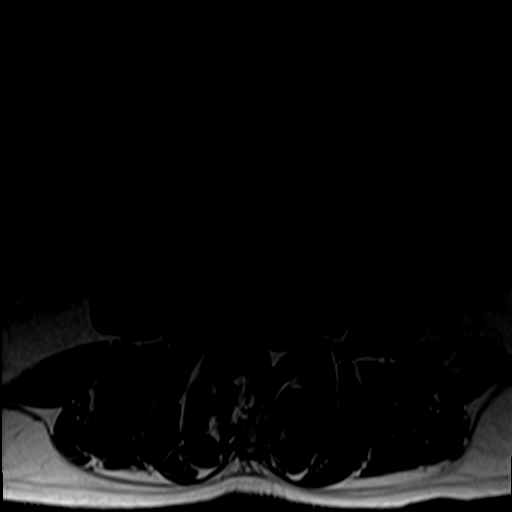
[im 20/40]
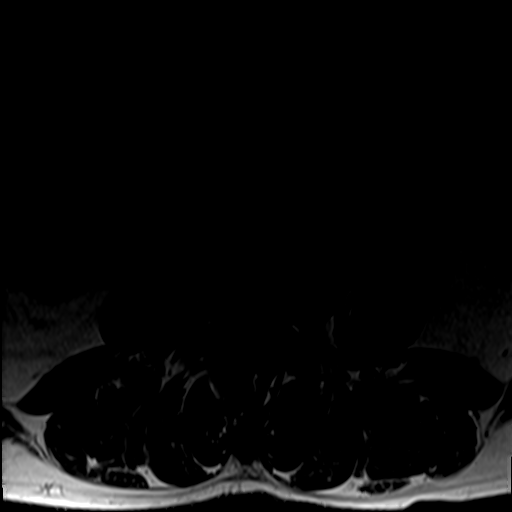
[im 23/40]
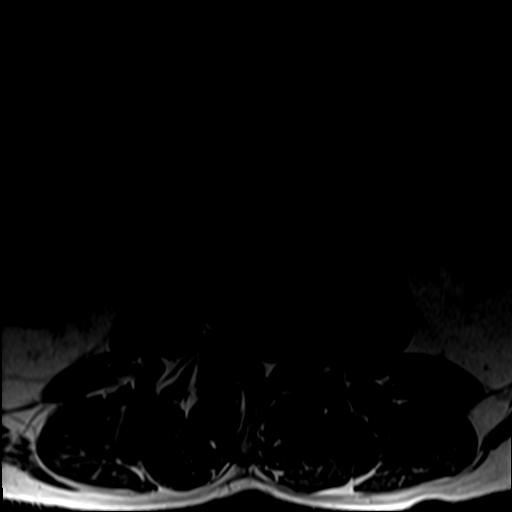
[im 27/40]
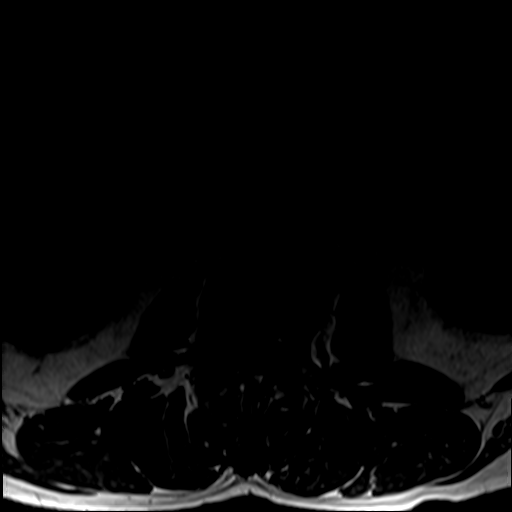
[im 33/40]
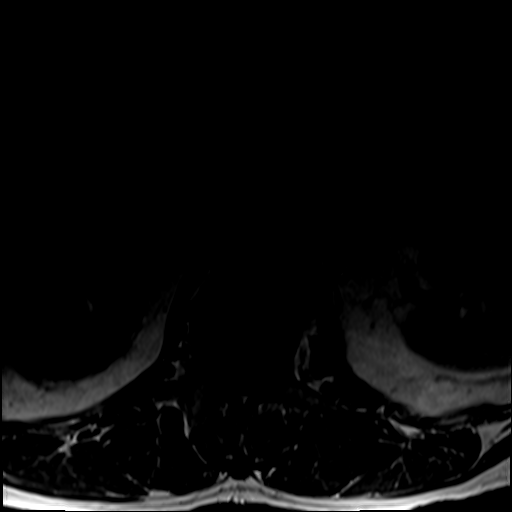

[32 of 48 positions shown; findings below may reference images not displayed]

FINDINGS: Segmentation: Normal segmentation. Lowest well-formed disc is
labeled the L5-S1 level.

Alignment: Levoscoliosis. Vertebral bodies otherwise normally
aligned with preservation of the normal lumbar lordosis. No
listhesis.

Vertebrae: Vertebral body heights are maintained. No evidence for
acute or chronic fracture. Signal intensity within the vertebral
body bone marrow is normal. No focal osseous lesions. No abnormal
marrow edema.

Conus medullaris: Extends to the L1 level and appears normal.

Paraspinal and other soft tissues: Paraspinous soft tissues
demonstrate no acute abnormality. 3.5 cm T2 hyperintense cyst noted
within the interpolar right kidney. Visualized visceral structures
otherwise unremarkable.

Disc levels:

T12-L1: Mild diffuse disc bulge with disc desiccation. No stenosis.

L1-2: Diffuse degenerative disc bulge with disc desiccation and
intervertebral disc space narrowing. Superimposed broad right
foraminal/ extraforaminal disc protrusion (series 5, image 12).
Slight cephalad and caudad migration (Series 3, image 10).
Protruding disc encroaches upon the right lateral recess with
resultant mild right lateral recess stenosis. No significant canal
narrowing. Mild bilateral facet hypertrophy. No significant
foraminal encroachment.

L2-3: Diffuse degenerative disc bulge with disc desiccation and
intervertebral disc space narrowing. Disc bulging eccentric to the
right. There is a superimposed central/ right subarticular disc
extrusion with inferior migration (series 2, image 12). Inferior
migration measures approximately 9 mm inferior to the parent L2-3
disc space. Superimposed mild facet and ligamentum flavum
hypertrophy. Resultant mild canal stenosis. Moderate right
subarticular stenosis. Mild to moderate right foraminal narrowing.
No significant left foraminal stenosis.

L3-4: Diffuse degenerative disc bulge with intervertebral disc space
narrowing and disc desiccation. Right far lateral/extraforaminal
reactive endplate the changes/endplate osteophytic spurring. Right
greater than left facet arthrosis. Resultant mild canal narrowing
with moderate right subarticular stenosis. Mild right foraminal
stenosis related degenerative disc osteophyte and facet disease. No
significant left foraminal narrowing.

L4-5: Diffuse degenerative disc bulge with disc desiccation and
intervertebral disc space narrowing. Disc bulging eccentric to the
left, encroaching upon the left lateral recess. Resultant moderate
left lateral recess stenosis, potentially affecting the descending
left L5 nerve root (series 5, image 30). Mild bilateral facet
hypertrophy. Moderate left foraminal stenosis related disc bulge and
facet disease. More mild right foraminal stenosis.

L5-S1: Shallow left foraminal disc protrusion, closely approximating
the exiting left L5 nerve root (series 3, image 15). Minimal facet
hypertrophy. No significant canal or lateral recess stenosis. Mild
left foraminal narrowing.
IMPRESSION: 1. Central/right subarticular disc extrusion with inferior migration
at L2-3 with resultant moderate right subarticular stenosis.
2. Moderate right lateral recess stenosis at L3-4 related to
degenerative disc bulge and facet disease.
3. Left eccentric disc bulging with facet disease at L4-5, resulting
in moderate left lateral recess and foraminal stenosis.
4. Small left foraminal disc protrusion at L5-S1, closely
approximating the exiting left L5 nerve root.
5. Additional more mild multilevel degenerative spondylolysis as
above. Please see above report for a full description of these
findings.

## 2017-10-25 ENCOUNTER — Encounter: Payer: Self-pay | Admitting: Family Medicine

## 2017-10-25 ENCOUNTER — Ambulatory Visit: Payer: BC Managed Care – PPO | Admitting: Family Medicine

## 2017-10-25 VITALS — BP 142/84 | HR 83 | Temp 98.4°F | Resp 16 | Wt 189.0 lb

## 2017-10-25 DIAGNOSIS — R42 Dizziness and giddiness: Secondary | ICD-10-CM

## 2017-10-25 DIAGNOSIS — H9202 Otalgia, left ear: Secondary | ICD-10-CM

## 2017-10-25 MED ORDER — AMOXICILLIN 500 MG PO CAPS: 1000.0000 mg | ORAL_CAPSULE | Freq: Three times a day (TID) | ORAL | 0 refills | Status: AC

## 2017-10-25 NOTE — Progress Notes (Signed)
Patient: Tyler Roberts Male    DOB: [birth year]   62 y.o.   MRN: [id number] Visit Date: 10/25/2017  Today's Provider: Lelon Huh, MD   Chief Complaint  Patient presents with  . Dizziness    x 2 days   Subjective:    Dizziness  This is a new problem. Episode onset: 2 days ago. The problem occurs intermittently. The problem has been unchanged. Associated symptoms include coughing, headaches and nausea. Pertinent negatives include no abdominal pain, chest pain, chills, congestion, diaphoresis, fatigue, fever, visual change or vomiting. Exacerbated by: certain movements.  He has also had some pain and swelling around left ear.      Allergies  Allergen Reactions  . Amlodipine Swelling    Facial swelling  . Fenofibrate     Other reaction(s): Muscle Pain Elevated CK  . Gemfibrozil     Other reaction(s): Muscle Pain  . Niacin     Flushing  . Lotensin [Benazepril] Rash     Current Outpatient Medications:  .  aspirin 81 MG tablet, , Disp: , Rfl:  .  esomeprazole (NEXIUM) 40 MG capsule, TAKE 1 CAPSULE BY MOUTH EVERY DAY, Disp: 30 capsule, Rfl: 12 .  flunisolide (NASALIDE) 25 MCG/ACT (0.025%) SOLN, USE 1 SPRAY INTO EACH NOSTRIL EVERY DAY, Disp: 25 mL, Rfl: 5 .  irbesartan-hydrochlorothiazide (AVALIDE) 150-12.5 MG tablet, TAKE ONE TABLET EVERY DAY, Disp: 30 tablet, Rfl: 11 .  lovastatin (MEVACOR) 20 MG tablet, TAKE ONE TABLET EVERY DAY, Disp: 30 tablet, Rfl: 5 .  metoprolol succinate (TOPROL-XL) 25 MG 24 hr tablet, Take 0.5 tablets (12.5 mg total) by mouth daily., Disp: 30 tablet, Rfl: 1 .  sildenafil (VIAGRA) 50 MG tablet, Take 1-2 tablets (50-100 mg total) by mouth daily as needed for erectile dysfunction (take 30 minute prior to intercourse)., Disp: 10 tablet, Rfl: 0 .  Testosterone 10 MG/ACT (2%) GEL, APPLY 4 PUMPS EVERY DAY AS DIRECTED, Disp: 60 g, Rfl: 5  Review of Systems  Constitutional: Negative for appetite change, chills, diaphoresis, fatigue and fever.  HENT:  Positive for facial swelling (left side). Negative for congestion.        Left ear fullness  Respiratory: Positive for cough. Negative for chest tightness, shortness of breath and wheezing.   Cardiovascular: Negative for chest pain and palpitations.  Gastrointestinal: Positive for nausea. Negative for abdominal pain and vomiting.  Neurological: Positive for dizziness and headaches.       Off balanced    Social History   Tobacco Use  . Smoking status: Former Smoker    Types: Cigars    Last attempt to quit: 04/05/2017    Years since quitting: 0.5  . Smokeless tobacco: Never Used  . Tobacco comment: occasionally smoked intermittenlty for a few years  Substance Use Topics  . Alcohol use: Yes    Alcohol/week: 0.0 oz    Comment: Occasional   Objective:   BP (!) 142/84 (BP Location: Right Arm, Cuff Size: Large)   Pulse 83   Temp 98.4 F (36.9 C) (Oral)   Resp 16   Wt 189 lb (85.7 kg)   SpO2 96% Comment: room air  BMI 27.12 kg/m  Vitals:   10/25/17 1348 10/25/17 1350  BP: (!) 162/84 (!) 142/84  Pulse: 83   Resp: 16   Temp: 98.4 F (36.9 C)   TempSrc: Oral   SpO2: 96%   Weight: 189 lb (85.7 kg)      Physical Exam  General Appearance:  Alert, cooperative, no distress  HENT:   bilateral TM normal without fluid or infection, neck without nodes and sinuses nontender. Slight swelling and tenderness of left pre-auricular nodes.   Eyes:    PERRL, conjunctiva/corneas clear, EOM's intact       Lungs:     Clear to auscultation bilaterally, respirations unlabored  Heart:    Regular rate and rhythm  Neurologic:   Awake, alert, oriented x 3. No apparent focal neurological           defect.          Assessment & Plan:     1. Dizziness  - Comprehensive metabolic panel - CBC  2. Left ear pain  - amoxicillin (AMOXIL) 500 MG capsule; Take 2 capsules (1,000 mg total) by mouth 3 (three) times daily for 5 days.  Dispense: 30 capsule; Refill: 0       Lelon Huh, MD    [facility]

## 2017-10-26 LAB — CBC
Hematocrit: 41.7 % (ref 37.5–51.0)
Hemoglobin: 13.9 g/dL (ref 13.0–17.7)
MCH: 29.4 pg (ref 26.6–33.0)
MCHC: 33.3 g/dL (ref 31.5–35.7)
MCV: 88 fL (ref 79–97)
Platelets: 266 10*3/uL (ref 150–450)
RBC: 4.72 x10E6/uL (ref 4.14–5.80)
RDW: 13.3 % (ref 12.3–15.4)
WBC: 7.3 10*3/uL (ref 3.4–10.8)

## 2017-10-26 LAB — COMPREHENSIVE METABOLIC PANEL
ALT: 21 IU/L (ref 0–44)
AST: 18 IU/L (ref 0–40)
Albumin/Globulin Ratio: 2.5 — ABNORMAL HIGH (ref 1.2–2.2)
Albumin: 4.7 g/dL (ref 3.6–4.8)
Alkaline Phosphatase: 54 IU/L (ref 39–117)
BUN/Creatinine Ratio: 16 (ref 10–24)
BUN: 14 mg/dL (ref 8–27)
Bilirubin Total: 0.9 mg/dL (ref 0.0–1.2)
CO2: 26 mmol/L (ref 20–29)
Calcium: 9.7 mg/dL (ref 8.6–10.2)
Chloride: 99 mmol/L (ref 96–106)
Creatinine, Ser: 0.85 mg/dL (ref 0.76–1.27)
GFR calc Af Amer: 109 mL/min/{1.73_m2} (ref >59)
GFR calc non Af Amer: 94 mL/min/{1.73_m2} (ref >59)
Globulin, Total: 1.9 g/dL (ref 1.5–4.5)
Glucose: 87 mg/dL (ref 65–99)
Potassium: 3.9 mmol/L (ref 3.5–5.2)
Sodium: 140 mmol/L (ref 134–144)
Total Protein: 6.6 g/dL (ref 6.0–8.5)

## 2017-10-31 ENCOUNTER — Telehealth: Payer: Self-pay | Admitting: Family Medicine

## 2017-10-31 NOTE — Telephone Encounter (Signed)
Tyler Roberts with [facility] stated that [employer] will not cover pt getting his CPAP machine replaced unless Dr. Caryn Section updates in pt's office visit note that pt's machine is more than 62 years old and needs to be replaced. Tyler Roberts is requesting OV notes be faxed to [phone] once update or new OV note if pt requires OV. Please advise. Thanks TNP

## 2017-11-02 ENCOUNTER — Telehealth: Payer: Self-pay | Admitting: *Deleted

## 2017-11-02 MED ORDER — LEVOFLOXACIN 500 MG PO TABS: 500.0000 mg | ORAL_TABLET | Freq: Every day | ORAL | 0 refills | Status: DC

## 2017-11-02 NOTE — Telephone Encounter (Signed)
Please contact patient and find out when he purchased his CPAP machine. I do not have a record of it. It looks like he had auto CPAP ordered in February 2016.

## 2017-11-02 NOTE — Telephone Encounter (Signed)
Patient stated he is not sure exactly how long he has had his CPAP machine. He knows its been longer than 5 years.  Patient stated he will try to find out tonight in old paperwork and call back to let us know.

## 2017-11-02 NOTE — Telephone Encounter (Signed)
Pt advised and agrees with treatment plan. 

## 2017-11-02 NOTE — Telephone Encounter (Signed)
Change to levofloxacin. Have sent prescription to [facility]. If not resolved next week will need to refer to ENT.

## 2017-11-02 NOTE — Telephone Encounter (Signed)
Patient was seen for ov 10/25/17 for left ear pain and dizziness. Patient was given amoxicillin. Patient states he has completed antibiotic but he is still having dizziness. Please advise?

## 2017-11-03 NOTE — Telephone Encounter (Signed)
Pt's wife called back with the date of his old c-pap.  She said the date is March 2010.  CB#  [phone] if you need her.  Thanks [employer]

## 2017-11-04 NOTE — Telephone Encounter (Signed)
Notes were faxed

## 2017-11-04 NOTE — Telephone Encounter (Signed)
Please send copy of office note from 10-05-2017 which has additional information.

## 2017-11-16 ENCOUNTER — Other Ambulatory Visit: Payer: Self-pay | Admitting: Family Medicine

## 2017-11-16 DIAGNOSIS — K219 Gastro-esophageal reflux disease without esophagitis: Secondary | ICD-10-CM

## 2017-12-06 LAB — HM COLONOSCOPY

## 2017-12-16 ENCOUNTER — Other Ambulatory Visit: Payer: Self-pay | Admitting: Family Medicine

## 2017-12-16 DIAGNOSIS — E781 Pure hyperglyceridemia: Secondary | ICD-10-CM

## 2017-12-22 ENCOUNTER — Encounter: Payer: Self-pay | Admitting: Family Medicine

## 2017-12-22 ENCOUNTER — Ambulatory Visit (INDEPENDENT_AMBULATORY_CARE_PROVIDER_SITE_OTHER): Payer: BC Managed Care – PPO | Admitting: Family Medicine

## 2017-12-22 VITALS — BP 136/78 | HR 82 | Temp 98.5°F | Resp 16 | Ht 70.0 in | Wt 184.0 lb

## 2017-12-22 DIAGNOSIS — E291 Testicular hypofunction: Secondary | ICD-10-CM

## 2017-12-22 DIAGNOSIS — G4733 Obstructive sleep apnea (adult) (pediatric): Secondary | ICD-10-CM

## 2017-12-22 DIAGNOSIS — E781 Pure hyperglyceridemia: Secondary | ICD-10-CM | POA: Diagnosis not present

## 2017-12-22 DIAGNOSIS — Z0000 Encounter for general adult medical examination without abnormal findings: Secondary | ICD-10-CM

## 2017-12-22 DIAGNOSIS — I10 Essential (primary) hypertension: Secondary | ICD-10-CM

## 2017-12-22 DIAGNOSIS — Z23 Encounter for immunization: Secondary | ICD-10-CM | POA: Diagnosis not present

## 2017-12-22 DIAGNOSIS — Z125 Encounter for screening for malignant neoplasm of prostate: Secondary | ICD-10-CM

## 2017-12-22 NOTE — Progress Notes (Signed)
Patient: Tyler Roberts, Male    DOB: [birth year], 62 y.o.   MRN: [id number] Visit Date: 12/22/2017  Today's Provider: Lelon Huh, MD   Chief Complaint  Patient presents with  . Annual Exam  . Hypertension  . Hyperlipidemia  . Gastroesophageal Reflux   Subjective:    Annual physical exam Tyler Roberts is a 62 y.o. male who presents today for health maintenance and complete physical. He feels well. He reports exercising yes. He reports he is sleeping fairly well.  ---------------------------------------------------------------   Hypertension, follow-up:  BP Readings from Last 3 Encounters:  12/22/17 136/78  10/25/17 (!) 142/84  10/05/17 (!) 148/80    He was last seen for hypertension 10 months ago.  BP at that visit was 120/68. Management since that visit includes; no changes.He reports good compliance with treatment. He is not having side effects. none He is exercising. He is adherent to low salt diet.   Outside blood pressures are not checking. He is experiencing none.  Patient denies none.   Cardiovascular risk factors include advanced age (older than 38 for men, 26 for women).  Use of agents associated with hypertension: none.   ---------------------------------------------------------------    Lipid/Cholesterol, Follow-up:   Last seen for this 1 years ago.  Management since that visit includes; labs checked, no changes.  Last Lipid Panel:    Component Value Date/Time   CHOL 167 12/15/2016 1019   CHOL 176 10/08/2015 0949   TRIG 210 (H) 12/15/2016 1019   HDL 41 12/15/2016 1019   HDL 38 (L) 10/08/2015 0949   CHOLHDL 4.1 12/15/2016 1019   LDLCALC 96 12/15/2016 1019    He reports good compliance with treatment. He is not having side effects. none  Wt Readings from Last 3 Encounters:  12/22/17 184 lb (83.5 kg)  10/25/17 189 lb (85.7 kg)  10/05/17 189 lb (85.7 kg)     ---------------------------------------------------------------  Testicular hypofunction From 02/16/2017-labs checked, no changes. He is doing well with transdermal testosterone which he feels has improved his energy level, and is tolerating with no adverse effects.   Obstructive apnea From 10/05/2017-new CPAP machine ordered due to wear and tear. He reports new CPAP machine is working well. He is using it every night and feels well rested in the morning.   Gastroesophageal reflux disease, esophagitis presence not specified From 10/05/2017-referred to Gastroenterology. Has since had EGD and colonoscopy by Dr. Tiffany Kocher. Reports he has some gastritis for which he taking esomeprazole. Also had several polyps and was advised to repeat colonoscopy in 3 years.     Review of Systems  Constitutional: Negative for chills, diaphoresis and fever.  HENT: Negative for congestion, ear discharge, ear pain, hearing loss, nosebleeds, sore throat and tinnitus.   Eyes: Negative for photophobia, pain, discharge and redness.  Respiratory: Positive for apnea. Negative for cough, shortness of breath, wheezing and stridor.   Cardiovascular: Negative for chest pain, palpitations and leg swelling.  Gastrointestinal: Negative for abdominal pain, blood in stool, constipation, diarrhea, nausea and vomiting.  Endocrine: Negative for polydipsia.  Genitourinary: Negative for dysuria, flank pain, frequency, hematuria and urgency.  Musculoskeletal: Negative for back pain, myalgias and neck pain.  Skin: Negative for rash.  Allergic/Immunologic: Negative for environmental allergies.  Neurological: Negative for dizziness, tremors, seizures, weakness and headaches.  Hematological: Does not bruise/bleed easily.  Psychiatric/Behavioral: Negative for hallucinations and suicidal ideas. The patient is not nervous/anxious.   All other systems reviewed and are negative.   Social History  He  reports that he quit smoking  about 8 months ago. His smoking use included cigars. He has never used smokeless tobacco. He reports that he drinks alcohol. He reports that he does not use drugs.       Social History   Socioeconomic History  . Marital status: Married    Spouse name: Not on file  . Number of children: 1  . Years of education: H/S  . Highest education level: Not on file  Occupational History  . Occupation: Radiographer, therapeutic: [employer]  . Financial resource strain: Not on file  . Food insecurity:    Worry: Not on file    Inability: Not on file  . Transportation needs:    Medical: Not on file    Non-medical: Not on file  Tobacco Use  . Smoking status: Former Smoker    Types: Cigars    Last attempt to quit: 04/05/2017    Years since quitting: 0.7  . Smokeless tobacco: Never Used  . Tobacco comment: occasionally smoked intermittenlty for a few years  Substance and Sexual Activity  . Alcohol use: Yes    Alcohol/week: 0.0 standard drinks    Comment: Occasional  . Drug use: No  . Sexual activity: Not on file  Lifestyle  . Physical activity:    Days per week: Not on file    Minutes per session: Not on file  . Stress: Not on file  Relationships  . Social connections:    Talks on phone: Not on file    Gets together: Not on file    Attends religious service: Not on file    Active member of club or organization: Not on file    Attends meetings of clubs or organizations: Not on file    Relationship status: Not on file  Other Topics Concern  . Not on file  Social History Narrative  . Not on file    Past Medical History:  Diagnosis Date  . Shingles 09/26/2014     Patient Active Problem List   Diagnosis Date Noted  . Erectile dysfunction 10/08/2015  . LBP (low back pain) 09/26/2014  . Allergic rhinitis 09/28/2009  . Benign essential hypertension 09/23/2009  . Fam hx-ischem heart disease 09/05/2006  . Testicular hypofunction 09/05/2006  .  Obstructive apnea 03/03/2006  . Hyperglyceridemia, pure 09/13/2005  . Acid reflux 04/06/2003    Past Surgical History:  Procedure Laterality Date  . HERNIA REPAIR  1990  . KNEE SURGERY Right 2008   Due to a staph infection  . LASIK    . NOSE SURGERY  2002  . TONSILLECTOMY AND ADENOIDECTOMY  2002    Family History        Family Status  Relation Name Status  . Mother  Deceased  . Father  Deceased  . Sister  Deceased  . Brother  Alive  . Sister  Alive  . Sister  Alive        His family history includes Anuerysm in his mother; Healthy in his brother, sister, and sister; Heart attack in his father; Heart disease (age of onset: 63's) in his father; Ovarian cancer in his sister.      Allergies  Allergen Reactions  . Amlodipine Swelling    Facial swelling  . Fenofibrate     Other reaction(s): Muscle Pain Elevated CK  . Gemfibrozil     Other reaction(s): Muscle Pain  . Niacin  Flushing  . Lotensin [Benazepril] Rash     Current Outpatient Medications:  .  aspirin 81 MG tablet, , Disp: , Rfl:  .  esomeprazole (NEXIUM) 40 MG capsule, TAKE 1 CAPSULE BY MOUTH EVERY DAY, Disp: 30 capsule, Rfl: 12 .  flunisolide (NASALIDE) 25 MCG/ACT (0.025%) SOLN, USE 1 SPRAY INTO EACH NOSTRIL EVERY DAY, Disp: 25 mL, Rfl: 5 .  irbesartan-hydrochlorothiazide (AVALIDE) 150-12.5 MG tablet, TAKE ONE TABLET EVERY DAY, Disp: 30 tablet, Rfl: 11 .  lovastatin (MEVACOR) 20 MG tablet, TAKE ONE TABLET BY MOUTH EVERY DAY, Disp: 30 tablet, Rfl: 12 .  metoprolol succinate (TOPROL-XL) 25 MG 24 hr tablet, Take 0.5 tablets (12.5 mg total) by mouth daily., Disp: 30 tablet, Rfl: 1 .  Testosterone 10 MG/ACT (2%) GEL, APPLY 4 PUMPS EVERY DAY AS DIRECTED, Disp: 60 g, Rfl: 5   Patient Care Team: [name], MD as PCP - General (Family Medicine) Brendolyn Patty, MD (Dermatology) Marlaine Hind, MD as Consulting Physician (Physical Medicine and Rehabilitation) Pa, [place]      Objective:    Vitals: BP 136/78 (BP Location: Right Arm, Patient Position: Sitting, Cuff Size: Large)   Pulse 82   Temp 98.5 F (36.9 C) (Oral)   Resp 16   Ht 5\' 10"  (1.778 m)   Wt 184 lb (83.5 kg)   SpO2 94%   BMI 26.40 kg/m       Physical Exam   General Appearance:    Alert, cooperative, no distress, appears stated age  Head:    Normocephalic, without obvious abnormality, atraumatic  Eyes:    PERRL, conjunctiva/corneas clear, EOM's intact, fundi    benign, both eyes       Ears:    Normal TM's and external ear canals, both ears  Nose:   Nares normal, septum midline, mucosa normal, no drainage   or sinus tenderness  Throat:   Lips, mucosa, and tongue normal; teeth and gums normal  Neck:   Supple, symmetrical, trachea midline, no adenopathy;       thyroid:  No enlargement/tenderness/nodules; no carotid   bruit or JVD  Back:     Symmetric, no curvature, ROM normal, no CVA tenderness  Lungs:     Clear to auscultation bilaterally, respirations unlabored  Chest wall:    No tenderness or deformity  Heart:    Regular rate and rhythm, S1 and S2 normal, no murmur, rub   or gallop  Abdomen:     Soft, non-tender, bowel sounds active all four quadrants,    no masses, no organomegaly  Genitalia:    deferred  Rectal:    deferred  Extremities:   Extremities normal, atraumatic, no cyanosis or edema  Pulses:   2+ and symmetric all extremities  Skin:   Skin color, texture, turgor normal, no rashes or lesions  Lymph nodes:   Cervical, supraclavicular, and axillary nodes normal  Neurologic:   CNII-XII intact. Normal strength, sensation and reflexes      throughout    Depression Screen PHQ 2/9 Scores 12/22/2017 12/15/2016 10/08/2015 09/27/2014  PHQ - 2 Score 0 0 0 0  PHQ- 9 Score 0 - 6 -      Assessment & Plan:     Routine Health Maintenance and Physical Exam  Exercise Activities and Dietary recommendations Goals   None     Immunization History  Administered Date(s) Administered  .  Influenza-Unspecified 01/17/2017  . Tdap 07/25/2007  . Zoster Recombinat (Shingrix) 12/15/2016, 02/16/2017    Health  Maintenance  Topic Date Due  . HIV Screening  01/03/1971  . TETANUS/TDAP  07/24/2017  . INFLUENZA VACCINE  11/03/2017  . COLONOSCOPY  12/06/2020  . Hepatitis C Screening  Completed     Discussed health benefits of physical activity, and encouraged him to engage in regular exercise appropriate for his age and condition.    --------------------------------------------------------------------  1. Annual physical exam Generally doing well.   2. Benign essential hypertension Well controlled.  Continue current medications.   - EKG 12-Lead  3. Testicular hypofunction Doing well and medically benefiting from transdermal testosterone - PSA - CBC - Testosterone,Free and Total  4. Hyperglyceridemia, pure Doing well on current medications. - Comprehensive metabolic panel - Lipid panel  5. Obstructive apnea Compliant and medically benefiting from CPAP.   6. Prostate cancer screening  - PSA    Lelon Huh, MD  [facility]

## 2017-12-22 NOTE — Addendum Note (Signed)
Addended by: Julieta Bellini on: 12/22/2017 10:30 AM   Modules accepted: Orders

## 2017-12-25 LAB — COMPREHENSIVE METABOLIC PANEL
ALT: 24 IU/L (ref 0–44)
AST: 22 IU/L (ref 0–40)
Albumin/Globulin Ratio: 2.3 — ABNORMAL HIGH (ref 1.2–2.2)
Albumin: 4.6 g/dL (ref 3.6–4.8)
Alkaline Phosphatase: 55 IU/L (ref 39–117)
BUN/Creatinine Ratio: 17 (ref 10–24)
BUN: 14 mg/dL (ref 8–27)
Bilirubin Total: 0.9 mg/dL (ref 0.0–1.2)
CO2: 23 mmol/L (ref 20–29)
Calcium: 9.6 mg/dL (ref 8.6–10.2)
Chloride: 102 mmol/L (ref 96–106)
Creatinine, Ser: 0.82 mg/dL (ref 0.76–1.27)
GFR calc Af Amer: 110 mL/min/{1.73_m2} (ref >59)
GFR calc non Af Amer: 95 mL/min/{1.73_m2} (ref >59)
Globulin, Total: 2 g/dL (ref 1.5–4.5)
Glucose: 100 mg/dL — ABNORMAL HIGH (ref 65–99)
Potassium: 4.3 mmol/L (ref 3.5–5.2)
Sodium: 142 mmol/L (ref 134–144)
Total Protein: 6.6 g/dL (ref 6.0–8.5)

## 2017-12-25 LAB — LIPID PANEL
Chol/HDL Ratio: 4 ratio (ref 0.0–5.0)
Cholesterol, Total: 164 mg/dL (ref 100–199)
HDL: 41 mg/dL (ref >39)
LDL Calculated: 86 mg/dL (ref 0–99)
Triglycerides: 187 mg/dL — ABNORMAL HIGH (ref 0–149)
VLDL Cholesterol Cal: 37 mg/dL (ref 5–40)

## 2017-12-25 LAB — CBC
Hematocrit: 43.3 % (ref 37.5–51.0)
Hemoglobin: 14.4 g/dL (ref 13.0–17.7)
MCH: 29.6 pg (ref 26.6–33.0)
MCHC: 33.3 g/dL (ref 31.5–35.7)
MCV: 89 fL (ref 79–97)
Platelets: 283 10*3/uL (ref 150–450)
RBC: 4.86 x10E6/uL (ref 4.14–5.80)
RDW: 13.1 % (ref 12.3–15.4)
WBC: 5.9 10*3/uL (ref 3.4–10.8)

## 2017-12-25 LAB — TESTOSTERONE,FREE AND TOTAL
Testosterone, Free: 7.6 pg/mL (ref 6.6–18.1)
Testosterone: 386 ng/dL (ref 264–916)

## 2017-12-25 LAB — PSA: Prostate Specific Ag, Serum: 0.8 ng/mL (ref 0.0–4.0)

## 2017-12-28 ENCOUNTER — Encounter: Payer: Self-pay | Admitting: Family Medicine

## 2018-02-25 ENCOUNTER — Other Ambulatory Visit: Payer: Self-pay | Admitting: Family Medicine

## 2018-03-13 ENCOUNTER — Other Ambulatory Visit: Payer: Self-pay | Admitting: Family Medicine

## 2018-03-13 DIAGNOSIS — I10 Essential (primary) hypertension: Secondary | ICD-10-CM

## 2018-03-13 MED ORDER — METOPROLOL SUCCINATE ER 25 MG PO TB24: 12.5000 mg | ORAL_TABLET | Freq: Every day | ORAL | 5 refills | Status: DC

## 2018-03-13 NOTE — Telephone Encounter (Signed)
Pt needing a refill on:  metoprolol succinate (TOPROL-XL) 25 MG 24 hr tablet   Please fill at: [place], [place] - [address] [phone] (Phone) [phone] (Fax)   Thanks, [employer]

## 2018-04-22 ENCOUNTER — Other Ambulatory Visit: Payer: Self-pay | Admitting: Family Medicine

## 2018-04-28 ENCOUNTER — Other Ambulatory Visit: Payer: Self-pay | Admitting: Family Medicine

## 2018-05-01 ENCOUNTER — Other Ambulatory Visit: Payer: Self-pay | Admitting: Family Medicine

## 2018-05-01 NOTE — Telephone Encounter (Signed)
Called [facility] they received the RX.   Thanks,   -Mickel Baas

## 2018-05-01 NOTE — Telephone Encounter (Signed)
Received e-prescribing error for testosterone that was sent 04-28-2018. Please contact [facility] and see if they received prescription. If not then please call in. Thanks.

## 2018-06-05 ENCOUNTER — Other Ambulatory Visit: Payer: Self-pay | Admitting: Family Medicine

## 2018-08-25 ENCOUNTER — Other Ambulatory Visit: Payer: Self-pay | Admitting: Family Medicine

## 2018-09-01 ENCOUNTER — Other Ambulatory Visit: Payer: Self-pay | Admitting: Family Medicine

## 2018-11-14 ENCOUNTER — Other Ambulatory Visit: Payer: Self-pay | Admitting: Family Medicine

## 2018-11-14 DIAGNOSIS — K219 Gastro-esophageal reflux disease without esophagitis: Secondary | ICD-10-CM

## 2018-12-12 ENCOUNTER — Other Ambulatory Visit: Payer: Self-pay | Admitting: Family Medicine

## 2018-12-12 NOTE — Telephone Encounter (Signed)
L.O.V. was 12/22/2017 and no upcoming appointment.

## 2018-12-13 ENCOUNTER — Other Ambulatory Visit: Payer: Self-pay | Admitting: Family Medicine

## 2019-01-09 ENCOUNTER — Other Ambulatory Visit: Payer: Self-pay | Admitting: Family Medicine

## 2019-01-09 DIAGNOSIS — E781 Pure hyperglyceridemia: Secondary | ICD-10-CM

## 2019-01-29 ENCOUNTER — Other Ambulatory Visit: Payer: Self-pay | Admitting: Family Medicine

## 2019-02-23 ENCOUNTER — Ambulatory Visit: Payer: BC Managed Care – PPO | Admitting: Physician Assistant

## 2019-02-23 ENCOUNTER — Other Ambulatory Visit: Payer: Self-pay

## 2019-02-23 ENCOUNTER — Encounter: Payer: Self-pay | Admitting: Physician Assistant

## 2019-02-23 VITALS — BP 140/88 | Temp 97.5°F | Wt 187.4 lb

## 2019-02-23 DIAGNOSIS — L0291 Cutaneous abscess, unspecified: Secondary | ICD-10-CM

## 2019-02-23 MED ORDER — DOXYCYCLINE HYCLATE 100 MG PO TABS: 100.0000 mg | ORAL_TABLET | Freq: Two times a day (BID) | ORAL | 0 refills | Status: DC

## 2019-02-23 NOTE — Patient Instructions (Signed)

## 2019-02-23 NOTE — Progress Notes (Signed)
Patient: Tyler Roberts Male    DOB: March 11, 1956   63 y.o.   MRN: [id number] Visit Date: 02/23/2019  Today's Provider: Trinna Post, PA-C   Chief Complaint  Patient presents with  . Skin Problem   Subjective:     HPI  Skin Problem  Patient presents today for skin check for a spot on left side of his neck. Patient states the area is painful, red and sore when applying pressure to the area.   Allergies  Allergen Reactions  . Amlodipine Swelling    Facial swelling  . Fenofibrate     Other reaction(s): Muscle Pain Elevated CK  . Gemfibrozil     Other reaction(s): Muscle Pain  . Niacin     Flushing  . Lotensin [Benazepril] Rash     Current Outpatient Medications:  .  aspirin 81 MG tablet, , Disp: , Rfl:  .  esomeprazole (NEXIUM) 40 MG capsule, TAKE 1 CAPSULE BY MOUTH ONCE DAILY, Disp: 30 capsule, Rfl: 12 .  flunisolide (NASALIDE) 25 MCG/ACT (0.025%) SOLN, ONE SPRAY IN EACH NOSTRIL EVERY DAY, Disp: 25 mL, Rfl: 5 .  irbesartan-hydrochlorothiazide (AVALIDE) 150-12.5 MG tablet, TAKE ONE TABLET BY MOUTH EVERY DAY, Disp: 30 tablet, Rfl: 1 .  lovastatin (MEVACOR) 20 MG tablet, TAKE 1 TABLET BY MOUTH DAILY, Disp: 30 tablet, Rfl: 12 .  meloxicam (MOBIC) 15 MG tablet, TAKE 1 TABLET BY MOUTH DAILY WITH A MEAL, Disp: 90 tablet, Rfl: 3 .  metoprolol succinate (TOPROL-XL) 25 MG 24 hr tablet, Take 0.5 tablets (12.5 mg total) by mouth daily., Disp: 30 tablet, Rfl: 5 .  Testosterone 10 MG/ACT (2%) GEL, APPLY 4 PUMPS EVERY DAY AS DIRECTED, Disp: 60 g, Rfl: 0  Review of Systems  Social History   Tobacco Use  . Smoking status: Former Smoker    Types: Cigars    Quit date: 04/05/2017    Years since quitting: 1.8  . Smokeless tobacco: Never Used  . Tobacco comment: occasionally smoked intermittenlty for a few years  Substance Use Topics  . Alcohol use: Yes    Alcohol/week: 0.0 standard drinks    Comment: Occasional      Objective:   BP 140/88 (BP Location: Right Arm,  Patient Position: Sitting, Cuff Size: Normal)   Temp (!) 97.5 F (36.4 C) (Temporal)   Wt 187 lb 6.4 oz (85 kg)   BMI 26.89 kg/m  Vitals:   02/23/19 1019  BP: 140/88  Temp: (!) 97.5 F (36.4 C)  TempSrc: Temporal  Weight: 187 lb 6.4 oz (85 kg)  Body mass index is 26.89 kg/m.   Physical Exam Constitutional:      General: He is not in acute distress.    Appearance: Normal appearance. He is not ill-appearing.  Neck:   Skin:    General: Skin is warm and dry.     Findings: Erythema present.  Neurological:     Mental Status: He is alert and oriented to person, place, and time. Mental status is at baseline.  Psychiatric:        Mood and Affect: Mood normal.        Behavior: Behavior normal.      No results found for any visits on 02/23/19.     Assessment & Plan    1. Abscess  Counseled patient this may be abscess vs infected cyst. Will give antibiotic as below and advised patient to encourage drainage with warm wash cloth. Advised patient the treatment for this  is drainage and if it can spontaneously drain that would be ideal. Counseled drainage may appear bloody or purulent and may have bad odor. He is following up with PCP at next visit and I think this would be a good touch point. Counseled on return precautions.   - doxycycline (VIBRA-TABS) 100 MG tablet; Take 1 tablet (100 mg total) by mouth 2 (two) times daily for 7 days.  Dispense: 14 tablet; Refill: [place], PA-C  [facility]

## 2019-02-27 NOTE — Progress Notes (Signed)
Patient: Tyler Roberts, Male    DOB: [birth year], 63 y.o.   MRN: [id number] Visit Date: 02/28/2019  Today's Provider: Lelon Huh, MD   Chief Complaint  Patient presents with  . Annual Exam  . Hypertension  . Hyperlipidemia   Subjective:     Annual physical exam Tyler Roberts is a 63 y.o. male who presents today for health maintenance and complete physical. He feels fairly well. He reports exercising daily. He reports he is sleeping well.  Has been on doxycycline this week for abscess on back of neck which has greatly improved. Is still slightly tender and swollen but no longer draining.  -----------------------------------------------------------------   Hypertension, follow-up:  BP Readings from Last 3 Encounters:  02/28/19 (!) 152/84  02/23/19 140/88  12/22/17 136/78    He was last seen for hypertension 1 years ago.  BP at that visit was 136/78. Management since that visit includes no chaged. He reports good compliance with treatment. He is not having side effects.  He is exercising. He is adherent to low salt diet.   Outside blood pressures are being checked at home. He is experiencing none.  Patient denies chest pain, chest pressure/discomfort, irregular heart beat and palpitations.   Cardiovascular risk factors include advanced age (older than 52 for men, 83 for women), dyslipidemia, hypertension and male gender.  Use of agents associated with hypertension: steroids.     Weight trend: stable Wt Readings from Last 3 Encounters:  02/28/19 189 lb 6.4 oz (85.9 kg)  02/23/19 187 lb 6.4 oz (85 kg)  12/22/17 184 lb (83.5 kg)    Current diet: in general, a "healthy" diet    ------------------------------------------------------------------------  Follow up for Testicular Hypofunction:  The patient was last seen for this 1 years ago. Changes made at last visit include none.  He reports good compliance with treatment. He feels that condition is  Improved. He is not having side effects.   ------------------------------------------------------------------------------------   Lipid/Cholesterol, Follow-up:   Last seen for this1 years ago.  Management changes since that visit include none. . Last Lipid Panel:    Component Value Date/Time   CHOL 164 12/22/2017 1016   TRIG 187 (H) 12/22/2017 1016   HDL 41 12/22/2017 1016   CHOLHDL 4.0 12/22/2017 1016   CHOLHDL 4.1 12/15/2016 1019   LDLCALC 86 12/22/2017 1016   LDLCALC 96 12/15/2016 1019    Risk factors for vascular disease include hypercholesterolemia and hypertension  He reports good compliance with treatment. He is not having side effects.  Current symptoms include none  Weight trend: stable Prior visit with dietician: no Current diet: in general, a "healthy" diet   Current exercise: yard work  [facility] Readings from Last 3 Encounters:  02/28/19 189 lb 6.4 oz (85.9 kg)  02/23/19 187 lb 6.4 oz (85 kg)  12/22/17 184 lb (83.5 kg)    -------------------------------------------------------------------  Follow up for Obstructive apnea:  The patient was last seen for this 1 years ago. Changes made at last visit include none; continue CPAP useage.  He reports good compliance with treatment. Uses it every night and finds it very effective, feels much better since starting it  He is not having side effects.   ------------------------------------------------------------------------------------  Review of Systems  Constitutional: Negative for appetite change, chills, fatigue and fever.  HENT: Negative for congestion, ear pain, hearing loss, nosebleeds and trouble swallowing.   Eyes: Negative for pain and visual disturbance.  Respiratory: Negative for cough, chest tightness  and shortness of breath.   Cardiovascular: Negative for chest pain, palpitations and leg swelling.  Gastrointestinal: Negative for abdominal pain, blood in stool, constipation, diarrhea, nausea and  vomiting.  Endocrine: Negative for polydipsia, polyphagia and polyuria.  Genitourinary: Negative for dysuria and flank pain.  Musculoskeletal: Negative for arthralgias, back pain, joint swelling, myalgias and neck stiffness.  Skin: Negative for color change, rash and wound.  Neurological: Negative for dizziness, tremors, seizures, speech difficulty, weakness, light-headedness and headaches.  Psychiatric/Behavioral: Negative for behavioral problems, confusion, decreased concentration, dysphoric mood and sleep disturbance. The patient is not nervous/anxious.   All other systems reviewed and are negative.   Social History      He  reports that he quit smoking about 22 months ago. His smoking use included cigars. He has never used smokeless tobacco. He reports current alcohol use. He reports that he does not use drugs.       Social History   Socioeconomic History  . Marital status: Married    Spouse name: Not on file  . Number of children: 1  . Years of education: H/S  . Highest education level: Not on file  Occupational History  . Occupation: Radiographer, therapeutic: [employer]  . Financial resource strain: Not on file  . Food insecurity    Worry: Not on file    Inability: Not on file  . Transportation needs    Medical: Not on file    Non-medical: Not on file  Tobacco Use  . Smoking status: Former Smoker    Types: Cigars    Quit date: 04/05/2017    Years since quitting: 1.9  . Smokeless tobacco: Never Used  . Tobacco comment: occasionally smoked intermittenlty for a few years  Substance and Sexual Activity  . Alcohol use: Yes    Alcohol/week: 0.0 standard drinks    Comment: Occasional  . Drug use: No  . Sexual activity: Not on file  Lifestyle  . Physical activity    Days per week: Not on file    Minutes per session: Not on file  . Stress: Not on file  Relationships  . Social Herbalist on phone: Not on file    Gets  together: Not on file    Attends religious service: Not on file    Active member of club or organization: Not on file    Attends meetings of clubs or organizations: Not on file    Relationship status: Not on file  Other Topics Concern  . Not on file  Social History Narrative  . Not on file    Past Medical History:  Diagnosis Date  . Shingles 09/26/2014     Patient Active Problem List   Diagnosis Date Noted  . Erectile dysfunction 10/08/2015  . LBP (low back pain) 09/26/2014  . Allergic rhinitis 09/28/2009  . Benign essential hypertension 09/23/2009  . Fam hx-ischem heart disease 09/05/2006  . Testicular hypofunction 09/05/2006  . Obstructive apnea 03/03/2006  . Hyperglyceridemia, pure 09/13/2005  . Acid reflux 04/06/2003    Past Surgical History:  Procedure Laterality Date  . HERNIA REPAIR  1990  . KNEE SURGERY Right 2008   Due to a staph infection  . LASIK    . NOSE SURGERY  2002  . TONSILLECTOMY AND ADENOIDECTOMY  2002    Family History        Family Status  Relation Name Status  . Mother  Deceased  .  Father  Deceased  . Sister  Deceased  . Brother  Alive  . Sister  Alive  . Sister  Alive        His family history includes Anuerysm in his mother; Healthy in his brother, sister, and sister; Heart attack in his father; Heart disease (age of onset: 45's) in his father; Ovarian cancer in his sister.      Allergies  Allergen Reactions  . Amlodipine Swelling    Facial swelling  . Atorvastatin     weakness  . Fenofibrate     Other reaction(s): Muscle Pain Elevated CK  . Gemfibrozil     Other reaction(s): Muscle Pain  . Niacin     Flushing  . Lotensin [Benazepril] Rash     Current Outpatient Medications:  .  aspirin 81 MG tablet, , Disp: , Rfl:  .  doxycycline (VIBRA-TABS) 100 MG tablet, Take 1 tablet (100 mg total) by mouth 2 (two) times daily for 7 days., Disp: 14 tablet, Rfl: 0 .  esomeprazole (NEXIUM) 40 MG capsule, TAKE 1 CAPSULE BY MOUTH ONCE  DAILY, Disp: 30 capsule, Rfl: 12 .  flunisolide (NASALIDE) 25 MCG/ACT (0.025%) SOLN, ONE SPRAY IN EACH NOSTRIL EVERY DAY, Disp: 25 mL, Rfl: 5 .  irbesartan-hydrochlorothiazide (AVALIDE) 150-12.5 MG tablet, TAKE ONE TABLET BY MOUTH EVERY DAY, Disp: 30 tablet, Rfl: 1 .  lovastatin (MEVACOR) 20 MG tablet, TAKE 1 TABLET BY MOUTH DAILY, Disp: 30 tablet, Rfl: 12 .  meloxicam (MOBIC) 15 MG tablet, TAKE 1 TABLET BY MOUTH DAILY WITH A MEAL, Disp: 90 tablet, Rfl: 3 .  metoprolol succinate (TOPROL-XL) 25 MG 24 hr tablet, Take 0.5 tablets (12.5 mg total) by mouth daily., Disp: 30 tablet, Rfl: 5 .  Testosterone 10 MG/ACT (2%) GEL, APPLY 4 PUMPS EVERY DAY AS DIRECTED, Disp: 60 g, Rfl: 0   Patient Care Team: [name], MD as PCP - General (Family Medicine) Brendolyn Patty, MD (Dermatology) Marlaine Hind, MD as Consulting Physician (Physical Medicine and Rehabilitation) Pa, [place]    Objective:    Vitals: BP (!) 152/84 (BP Location: Right Arm, Patient Position: Sitting, Cuff Size: Normal)   Pulse 89   Temp (!) 96.9 F (36.1 C) (Temporal)   Ht 5\' 10"  (1.778 m)   Wt 189 lb 6.4 oz (85.9 kg)   SpO2 98%   BMI 27.18 kg/m    Vitals:   02/28/19 1000  BP: (!) 152/84  Pulse: 89  Temp: (!) 96.9 F (36.1 C)  TempSrc: Temporal  SpO2: 98%  Weight: 189 lb 6.4 oz (85.9 kg)  Height: 5\' 10"  (1.778 m)     Physical Exam   General Appearance:    Well developed, well nourished male. Alert, cooperative, in no acute distress, appears stated age  Head:    Normocephalic, without obvious abnormality, atraumatic  Eyes:    PERRL, conjunctiva/corneas clear, EOM's intact, fundi    benign, both eyes       Ears:    Normal TM's and external ear canals, both ears  Nose:   Nares normal, septum midline, mucosa normal, no drainage   or sinus tenderness  Throat:   Lips, mucosa, and tongue normal; teeth and gums normal  Neck:   Supple, symmetrical, trachea midline, no adenopathy;       thyroid:  No  enlargement/tenderness/nodules; no carotid   bruit or JVD  Back:     Symmetric, no curvature, ROM normal, no CVA tenderness  Lungs:     Clear  to auscultation bilaterally, respirations unlabored  Chest wall:    No tenderness or deformity  Heart:    Normal heart rate. Normal rhythm. No murmurs, rubs, or gallops.  S1 and S2 normal  Abdomen:     Soft, non-tender, bowel sounds active all four quadrants,    no masses, no organomegaly  Genitalia:    deferred  Rectal:    deferred  Extremities:   All extremities are intact. No cyanosis or edema  Pulses:   2+ and symmetric all extremities  Skin:   Skin color, texture, turgor normal, no rashes or lesions  Lymph nodes:   Cervical, supraclavicular, and axillary nodes normal  Neurologic:   CNII-XII intact. Normal strength, sensation and reflexes      throughout    Depression Screen PHQ 2/9 Scores 02/28/2019 12/22/2017 12/15/2016 10/08/2015  PHQ - 2 Score 0 0 0 0  PHQ- 9 Score - 0 - 6       Assessment & Plan:     Routine Health Maintenance and Physical Exam  Exercise Activities and Dietary recommendations Goals   None     Immunization History  Administered Date(s) Administered  . Influenza,inj,Quad PF,6+ Mos 12/22/2017, 01/26/2019  . Influenza-Unspecified 01/17/2017  . Td 12/22/2017  . Tdap 07/25/2007  . Zoster Recombinat (Shingrix) 12/15/2016, 02/16/2017    Health Maintenance  Topic Date Due  . HIV Screening  01/03/1971  . COLONOSCOPY  12/06/2020  . TETANUS/TDAP  12/23/2027  . INFLUENZA VACCINE  Completed  . Hepatitis C Screening  Completed     Discussed health benefits of physical activity, and encouraged him to engage in regular exercise appropriate for his age and condition.    -------------------------------------------------------------------- 1. Annual physical exam  - Comprehensive metabolic panel  2. Prostate cancer screening  - PSA  3. Benign essential hypertension Well controlled.  Continue current  medications.   - EKG 12-Lead  4. Obstructive apnea Using CPAP consistently every night and medically benefiting from its use.   5. Testicular hypofunction Doing well on current testosterone replacement.  - CBC - Testosterone,Free and Total ([facility])  6. Hyperglyceridemia, pure He is tolerating lovastatin well with no adverse effects.   - Lipid panel (fasting)  7. Abscess Is significantly improved since start doxy earlier this - doxycycline (VIBRA-TABS) 100 MG tablet; Take 1 tablet (100 mg total) by mouth 2 (two) times daily for 7 days.  Dispense: 14 tablet; Refill: 0  The entirety of the information documented in the History of Present Illness, Review of Systems and Physical Exam were personally obtained by me. Portions of this information were initially documented by Idelle Jo, CMA and reviewed by me for thoroughness and accuracy.'  Lelon Huh, MD  [facility]

## 2019-02-28 ENCOUNTER — Other Ambulatory Visit: Payer: Self-pay

## 2019-02-28 ENCOUNTER — Encounter: Payer: Self-pay | Admitting: Family Medicine

## 2019-02-28 ENCOUNTER — Ambulatory Visit (INDEPENDENT_AMBULATORY_CARE_PROVIDER_SITE_OTHER): Payer: BC Managed Care – PPO | Admitting: Family Medicine

## 2019-02-28 VITALS — BP 138/82 | HR 89 | Temp 96.9°F | Ht 70.0 in | Wt 189.4 lb

## 2019-02-28 DIAGNOSIS — I10 Essential (primary) hypertension: Secondary | ICD-10-CM

## 2019-02-28 DIAGNOSIS — Z125 Encounter for screening for malignant neoplasm of prostate: Secondary | ICD-10-CM

## 2019-02-28 DIAGNOSIS — G4733 Obstructive sleep apnea (adult) (pediatric): Secondary | ICD-10-CM

## 2019-02-28 DIAGNOSIS — E291 Testicular hypofunction: Secondary | ICD-10-CM

## 2019-02-28 DIAGNOSIS — E781 Pure hyperglyceridemia: Secondary | ICD-10-CM

## 2019-02-28 DIAGNOSIS — Z0000 Encounter for general adult medical examination without abnormal findings: Secondary | ICD-10-CM | POA: Diagnosis not present

## 2019-02-28 DIAGNOSIS — L0291 Cutaneous abscess, unspecified: Secondary | ICD-10-CM

## 2019-02-28 MED ORDER — DOXYCYCLINE HYCLATE 100 MG PO TABS: 100.0000 mg | ORAL_TABLET | Freq: Two times a day (BID) | ORAL | 0 refills | Status: AC

## 2019-03-03 LAB — CBC
Hematocrit: 44.7 % (ref 37.5–51.0)
Hemoglobin: 14.9 g/dL (ref 13.0–17.7)
MCH: 29.9 pg (ref 26.6–33.0)
MCHC: 33.3 g/dL (ref 31.5–35.7)
MCV: 90 fL (ref 79–97)
Platelets: 266 10*3/uL (ref 150–450)
RBC: 4.98 x10E6/uL (ref 4.14–5.80)
RDW: 12.8 % (ref 11.6–15.4)
WBC: 6.3 10*3/uL (ref 3.4–10.8)

## 2019-03-03 LAB — LIPID PANEL
Chol/HDL Ratio: 4 ratio (ref 0.0–5.0)
Cholesterol, Total: 166 mg/dL (ref 100–199)
HDL: 41 mg/dL (ref >39)
LDL Chol Calc (NIH): 91 mg/dL (ref 0–99)
Triglycerides: 197 mg/dL — ABNORMAL HIGH (ref 0–149)
VLDL Cholesterol Cal: 34 mg/dL (ref 5–40)

## 2019-03-03 LAB — COMPREHENSIVE METABOLIC PANEL
ALT: 27 IU/L (ref 0–44)
AST: 23 IU/L (ref 0–40)
Albumin/Globulin Ratio: 2.4 — ABNORMAL HIGH (ref 1.2–2.2)
Albumin: 4.7 g/dL (ref 3.8–4.8)
Alkaline Phosphatase: 68 IU/L (ref 39–117)
BUN/Creatinine Ratio: 16 (ref 10–24)
BUN: 14 mg/dL (ref 8–27)
Bilirubin Total: 0.8 mg/dL (ref 0.0–1.2)
CO2: 23 mmol/L (ref 20–29)
Calcium: 9.8 mg/dL (ref 8.6–10.2)
Chloride: 104 mmol/L (ref 96–106)
Creatinine, Ser: 0.88 mg/dL (ref 0.76–1.27)
GFR calc Af Amer: 106 mL/min/{1.73_m2} (ref >59)
GFR calc non Af Amer: 91 mL/min/{1.73_m2} (ref >59)
Globulin, Total: 2 g/dL (ref 1.5–4.5)
Glucose: 99 mg/dL (ref 65–99)
Potassium: 4.3 mmol/L (ref 3.5–5.2)
Sodium: 143 mmol/L (ref 134–144)
Total Protein: 6.7 g/dL (ref 6.0–8.5)

## 2019-03-03 LAB — PSA: Prostate Specific Ag, Serum: 1 ng/mL (ref 0.0–4.0)

## 2019-03-03 LAB — TESTOSTERONE,FREE AND TOTAL
Testosterone, Free: 11.6 pg/mL (ref 6.6–18.1)
Testosterone: 328 ng/dL (ref 264–916)

## 2019-03-05 ENCOUNTER — Other Ambulatory Visit: Payer: Self-pay | Admitting: Family Medicine

## 2019-03-05 NOTE — Telephone Encounter (Signed)
Requested medication (s) are due for refill today: yes  Requested medication (s) are on the active medication list: yes  Last refill: 01/29/2019  Future visit scheduled: no  Notes to clinic:  Off Protocol    Requested Prescriptions  Pending Prescriptions Disp Refills   Testosterone 10 MG/ACT (2%) GEL [Pharmacy Med Name: TESTOSTERONE 10 MG GEL PUMP] 60 g 0    Sig: APPLY 4 PUMPS EVERY DAY AS DIRECTED     Off-Protocol Failed - 03/05/2019 11:15 AM      Failed - Medication not assigned to a protocol, review manually.      Passed - Valid encounter within last 12 months    Recent Outpatient Visits          5 days ago Annual physical exam   [facility] [name], MD   1 week ago [place] Trinna Post, [place]   1 year ago Annual physical exam   [facility] [name], MD   1 year ago Dizziness   [facility] [name], MD   1 year ago Obstructive apnea   [facility] [name], MD

## 2019-03-06 ENCOUNTER — Encounter: Payer: Self-pay | Admitting: Family Medicine

## 2019-03-20 ENCOUNTER — Other Ambulatory Visit: Payer: Self-pay | Admitting: Family Medicine

## 2019-04-12 ENCOUNTER — Telehealth: Payer: Self-pay | Admitting: *Deleted

## 2019-04-12 NOTE — Telephone Encounter (Signed)
Copied from [place] [id number]. Topic: General - Inquiry >> Apr 12, 2019 11:20 AM Virl Axe D wrote: Reason for CRM: Shea Stakes called to discuss bill for pt with Arbie Cookey. Stated she has been working with her on this. Please return call. [phone]

## 2019-04-16 ENCOUNTER — Other Ambulatory Visit: Payer: Self-pay | Admitting: Family Medicine

## 2019-04-16 DIAGNOSIS — I10 Essential (primary) hypertension: Secondary | ICD-10-CM

## 2019-05-07 ENCOUNTER — Other Ambulatory Visit: Payer: Self-pay | Admitting: Family Medicine

## 2019-06-18 ENCOUNTER — Other Ambulatory Visit: Payer: Self-pay | Admitting: Family Medicine

## 2019-07-12 ENCOUNTER — Ambulatory Visit: Payer: BC Managed Care – PPO | Attending: Internal Medicine

## 2019-07-12 DIAGNOSIS — Z23 Encounter for immunization: Secondary | ICD-10-CM

## 2019-07-12 NOTE — Progress Notes (Signed)
   Covid-19 Vaccination Clinic  Name:  Tyler Roberts    MRN: [id number] DOB: May 18, 1955  07/12/2019  Tyler Roberts was observed post Covid-19 immunization for 15 minutes without incident. He was provided with Vaccine Information Sheet and instruction to access the V-Safe system.   Tyler Roberts was instructed to call 911 with any severe reactions post vaccine: Marland Kitchen Difficulty breathing  . Swelling of face and throat  . A fast heartbeat  . A bad rash all over body  . Dizziness and weakness   Immunizations Administered    Name Date Dose VIS Date Route   Pfizer COVID-19 Vaccine 07/12/2019 11:41 AM 0.3 mL 03/16/2019 Intramuscular   Manufacturer: [place]   Lot: [id number]   [place]: [id number]

## 2019-07-23 ENCOUNTER — Other Ambulatory Visit: Payer: Self-pay

## 2019-07-23 ENCOUNTER — Ambulatory Visit (INDEPENDENT_AMBULATORY_CARE_PROVIDER_SITE_OTHER): Payer: BC Managed Care – PPO | Admitting: Dermatology

## 2019-07-23 DIAGNOSIS — L820 Inflamed seborrheic keratosis: Secondary | ICD-10-CM | POA: Diagnosis not present

## 2019-07-23 DIAGNOSIS — L570 Actinic keratosis: Secondary | ICD-10-CM

## 2019-07-23 DIAGNOSIS — Z1283 Encounter for screening for malignant neoplasm of skin: Secondary | ICD-10-CM | POA: Diagnosis not present

## 2019-07-23 DIAGNOSIS — D1800 Hemangioma unspecified site: Secondary | ICD-10-CM

## 2019-07-23 DIAGNOSIS — L578 Other skin changes due to chronic exposure to nonionizing radiation: Secondary | ICD-10-CM

## 2019-07-23 DIAGNOSIS — L821 Other seborrheic keratosis: Secondary | ICD-10-CM

## 2019-07-23 DIAGNOSIS — D2339 Other benign neoplasm of skin of other parts of face: Secondary | ICD-10-CM

## 2019-07-23 DIAGNOSIS — D2239 Melanocytic nevi of other parts of face: Secondary | ICD-10-CM

## 2019-07-23 DIAGNOSIS — D224 Melanocytic nevi of scalp and neck: Secondary | ICD-10-CM

## 2019-07-23 DIAGNOSIS — L814 Other melanin hyperpigmentation: Secondary | ICD-10-CM

## 2019-07-23 DIAGNOSIS — D229 Melanocytic nevi, unspecified: Secondary | ICD-10-CM

## 2019-07-23 NOTE — Progress Notes (Signed)
   Follow-Up Visit   Subjective  Tyler Roberts is a 64 y.o. male who presents for the following: Annual Exam (UBSE). Patient has a history of precancerous AKs that have been treated with LN2 and PDT of face in the past (most recent one was in 11/20- had sunburn like peeling). No concerning spots that he has  noticed.  Has itchy, irritated growth on back that he would like removed.   The following portions of the chart were reviewed this encounter and updated as appropriate:     Review of Systems: No other skin or systemic complaints.  Objective  Well appearing patient in no apparent distress; mood and affect are within normal limits.  All skin waist up examined.  Objective  Left Temple Hairline x 1, Right eyebrow x 1, Right forehead x 2, Left lower cheek x 1, Right post upper arm x 1 (6): Erythematous thin papules/macules with gritty scale.   Objective  Right Paranasal: 2.36mm firm flesh papule- no change from previous visit  Objective  Left Lower Back: Erythematous keratotic or waxy stuck-on papule.   Objective  Right posterior neck: 3.83mm pink flesh papule  Assessment & Plan   Skin cancer screening performed today.  Actinic Damage - diffuse scaly erythematous macules with underlying dyspigmentation - Recommend daily broad spectrum sunscreen SPF 30+ to sun-exposed areas, reapply every 2 hours as needed.  - Call for new or changing lesions. Seborrheic Keratoses - Stuck-on, waxy, tan-brown papules and plaques; Left malar cheek. - Discussed benign etiology and prognosis. - Observe - Call for any changes  Lentigines - Scattered tan macules - Discussed due to sun exposure - Benign, observe - Call for any changes  Hemangiomas - Red papules - Discussed benign nature - Observe - Call for any changes      AK (actinic keratosis) (6) Left Temple Hairline x 1, Right eyebrow x 1, Right forehead x 2, Left lower cheek x 1, Right post upper arm x 1  Destruction of  lesion - Left Temple Hairline x 1, Right eyebrow x 1, Right forehead x 2, Left lower cheek x 1, Right post upper arm x 1  Destruction method: cryotherapy   Informed consent: discussed and consent obtained   Lesion destroyed using liquid nitrogen: Yes   Region frozen until ice ball extended beyond lesion: Yes   Outcome: patient tolerated procedure well with no complications   Post-procedure details: wound care instructions given    Fibrous papule of nose Right Paranasal  Stable. Benign, observe.    Inflamed seborrheic keratosis Left Lower Back  Destruction of lesion - Left Lower Back  Destruction method: cryotherapy   Informed consent: discussed and consent obtained   Lesion destroyed using liquid nitrogen: Yes   Region frozen until ice ball extended beyond lesion: Yes   Outcome: patient tolerated procedure well with no complications   Post-procedure details: wound care instructions given    Nevus Right posterior neck  Benign-appearing.  Recheck on f/up.  Call clinic for new or changing moles.  Recommend daily use of broad spectrum spf 30+ sunscreen to sun-exposed areas.    Return in about 6 months (around 01/22/2020) for AKs sun exposed skin.   IJamesetta Orleans, CMA, am acting as scribe for Brendolyn Patty, MD .

## 2019-07-23 NOTE — Patient Instructions (Signed)
Cryotherapy Aftercare  . Wash gently with soap and water everyday.   . Apply Vaseline and Band-Aid daily until healed.  

## 2019-08-07 ENCOUNTER — Ambulatory Visit: Payer: BC Managed Care – PPO | Attending: Internal Medicine

## 2019-08-07 DIAGNOSIS — Z23 Encounter for immunization: Secondary | ICD-10-CM

## 2019-08-07 NOTE — Progress Notes (Signed)
   Covid-19 Vaccination Clinic  Name:  Tyler Roberts    MRN: [id number] DOB: September 14, 1955  08/07/2019  [name] was observed post Covid-19 immunization for 15 minutes without incident. He was provided with Vaccine Information Sheet and instruction to access the V-Safe system.   [name] was instructed to call 911 with any severe reactions post vaccine: Marland Kitchen Difficulty breathing  . Swelling of face and throat  . A fast heartbeat  . A bad rash all over body  . Dizziness and weakness   Immunizations Administered    Name Date Dose VIS Date Route   Pfizer COVID-19 Vaccine 08/07/2019 11:19 AM 0.3 mL 05/30/2018 Intramuscular   Manufacturer: [place]   Lot: [id number]   [place]: [id number]

## 2019-09-03 ENCOUNTER — Other Ambulatory Visit: Payer: Self-pay | Admitting: Dermatology

## 2019-09-10 ENCOUNTER — Other Ambulatory Visit: Payer: Self-pay | Admitting: Family Medicine

## 2019-09-14 ENCOUNTER — Other Ambulatory Visit: Payer: Self-pay | Admitting: Family Medicine

## 2019-09-14 NOTE — Telephone Encounter (Signed)
Patient called, left VM to call the office to schedule an appointment in order to refill medication.

## 2019-09-14 NOTE — Telephone Encounter (Signed)
Requested medication (s) are due for refill today: Yes  Requested medication (s) are on the active medication list: Yes  Last refill:  06/18/19  Future visit scheduled: No  Notes to clinic:  Protocol states pt. Needs follow up. Wife reports PCP sees pt. Once a year.     Requested Prescriptions  Pending Prescriptions Disp Refills   irbesartan-hydrochlorothiazide (AVALIDE) 150-12.5 MG tablet [Pharmacy Med Name: IRBESARTAN-HCTZ 150-12.5 MG TAB] 90 tablet 0    Sig: TAKE ONE TABLET EVERY DAY      Cardiovascular: ARB + Diuretic Combos Failed - 09/14/2019 11:45 AM      Failed - K in normal range and within 180 days    Potassium  Date Value Ref Range Status  02/28/2019 4.3 3.5 - 5.2 mmol/L Final          Failed - Na in normal range and within 180 days    Sodium  Date Value Ref Range Status  02/28/2019 143 134 - 144 mmol/L Final          Failed - Cr in normal range and within 180 days    Creat  Date Value Ref Range Status  02/16/2017 0.82 0.70 - 1.25 mg/dL Final    Comment:    For patients >73 years of age, the reference limit for Creatinine is approximately 13% higher for people identified as African-American. .    Creatinine, Ser  Date Value Ref Range Status  02/28/2019 0.88 0.76 - 1.27 mg/dL Final          Failed - Ca in normal range and within 180 days    Calcium  Date Value Ref Range Status  02/28/2019 9.8 8.6 - 10.2 mg/dL Final          Failed - Valid encounter within last 6 months    Recent Outpatient Visits           6 months ago Annual physical exam   [facility] [name], MD   6 months ago [place] Trinna Post, [place]   1 year ago Annual physical exam   [facility] [name], MD   1 year ago [place], Donald E, MD   1 year ago Obstructive apnea   [facility] [name], MD       Future Appointments              In 4 months Brendolyn Patty, MD [place] - Patient is not pregnant      Passed - Last BP in normal range    BP Readings from Last 1 Encounters:  02/28/19 138/82

## 2019-09-14 NOTE — Telephone Encounter (Signed)
Wife called to ask the nurse to call her regarding patient's request for a refill.  She does not understand why he has to make an appt. To get the refill and would like to discuss it with the nurse.  CB# [phone]

## 2019-09-14 NOTE — Telephone Encounter (Signed)
Attempted to contact pt's wife; pt's wife was not able to hear this triage nurse; another triage nurse will attempt to contact her.

## 2019-10-08 ENCOUNTER — Other Ambulatory Visit: Payer: Self-pay | Admitting: Family Medicine

## 2019-12-11 ENCOUNTER — Other Ambulatory Visit: Payer: Self-pay | Admitting: Family Medicine

## 2019-12-11 DIAGNOSIS — I10 Essential (primary) hypertension: Secondary | ICD-10-CM

## 2019-12-11 NOTE — Telephone Encounter (Signed)
Requested medication (s) are due for refill today: yes  Requested medication (s) are on the active medication list: yes  Last refill:  09/15/19  #90  0 refills  Future visit scheduled: 03/05/20  Notes to clinic:  labs are out of date. Last OV 02/28/19    Requested Prescriptions  Pending Prescriptions Disp Refills   irbesartan-hydrochlorothiazide (AVALIDE) 150-12.5 MG tablet [Pharmacy Med Name: IRBESARTAN-HCTZ 150-12.5 MG TAB] 90 tablet 0    Sig: TAKE ONE TABLET EVERY DAY      Cardiovascular: ARB + Diuretic Combos Failed - 12/11/2019  4:59 PM      Failed - K in normal range and within 180 days    Potassium  Date Value Ref Range Status  02/28/2019 4.3 3.5 - 5.2 mmol/L Final          Failed - Na in normal range and within 180 days    Sodium  Date Value Ref Range Status  02/28/2019 143 134 - 144 mmol/L Final          Failed - Cr in normal range and within 180 days    Creat  Date Value Ref Range Status  02/16/2017 0.82 0.70 - 1.25 mg/dL Final    Comment:    For patients >79 years of age, the reference limit for Creatinine is approximately 13% higher for people identified as African-American. .    Creatinine, Ser  Date Value Ref Range Status  02/28/2019 0.88 0.76 - 1.27 mg/dL Final          Failed - Ca in normal range and within 180 days    Calcium  Date Value Ref Range Status  02/28/2019 9.8 8.6 - 10.2 mg/dL Final          Failed - Valid encounter within last 6 months    Recent Outpatient Visits           9 months ago Annual physical exam   [facility] [name], MD   9 months ago [place] Trinna Post, [place]   1 year ago Annual physical exam   [facility] [name], MD   2 years ago [place], Donald E, [place]   2 years ago Obstructive apnea   [facility] [name], MD       Future Appointments             In 1 month  Brendolyn Patty, MD [place]   In 2 months Caryn Section, Kirstie Peri, MD [facility] [address], [place] - Patient is not pregnant      Passed - Last BP in normal range    BP Readings from Last 1 Encounters:  02/28/19 138/82

## 2019-12-12 ENCOUNTER — Other Ambulatory Visit: Payer: Self-pay | Admitting: Family Medicine

## 2019-12-12 DIAGNOSIS — K219 Gastro-esophageal reflux disease without esophagitis: Secondary | ICD-10-CM

## 2020-01-13 ENCOUNTER — Other Ambulatory Visit: Payer: Self-pay | Admitting: Family Medicine

## 2020-01-13 DIAGNOSIS — E781 Pure hyperglyceridemia: Secondary | ICD-10-CM

## 2020-01-29 ENCOUNTER — Encounter: Payer: Self-pay | Admitting: Family Medicine

## 2020-01-29 ENCOUNTER — Ambulatory Visit: Payer: BC Managed Care – PPO | Admitting: Dermatology

## 2020-01-29 ENCOUNTER — Other Ambulatory Visit: Payer: Self-pay

## 2020-01-29 DIAGNOSIS — D489 Neoplasm of uncertain behavior, unspecified: Secondary | ICD-10-CM

## 2020-01-29 DIAGNOSIS — L570 Actinic keratosis: Secondary | ICD-10-CM | POA: Diagnosis not present

## 2020-01-29 DIAGNOSIS — L820 Inflamed seborrheic keratosis: Secondary | ICD-10-CM

## 2020-01-29 DIAGNOSIS — D224 Melanocytic nevi of scalp and neck: Secondary | ICD-10-CM

## 2020-01-29 DIAGNOSIS — L739 Follicular disorder, unspecified: Secondary | ICD-10-CM

## 2020-01-29 MED ORDER — CLINDAMYCIN PHOSPHATE 1 % EX FOAM: CUTANEOUS | 0 refills | Status: DC

## 2020-01-29 MED ORDER — KLISYRI 1 % EX OINT: TOPICAL_OINTMENT | CUTANEOUS | 0 refills | Status: DC

## 2020-01-29 MED ORDER — CLOBETASOL PROPIONATE 0.05 % EX SOLN: CUTANEOUS | 0 refills | Status: DC

## 2020-01-29 NOTE — Patient Instructions (Signed)

## 2020-01-29 NOTE — Progress Notes (Signed)
Follow-Up Visit   Subjective  Tyler Roberts is a 64 y.o. male who presents for the following: Follow-up (6 month recheck AKs; L temple hairline x 1, R eyebrow x 1, R forehead x 2, L lower cheek x 1, R posterior arm x 1) and Other (pt has a few spots on the back of his neck at the hairline he would like checked).  He gets sore bumps that he tends to pick at and are irritating.  He also has some irritated growths on his legs.   The following portions of the chart were reviewed this encounter and updated as appropriate:     Review of Systems: No other skin or systemic complaints except as noted in HPI or Assessment and Plan.   Objective  Well appearing patient in no apparent distress; mood and affect are within normal limits.  A focused examination was performed including face, neck, and legs. Relevant physical exam findings are noted in the Assessment and Plan.  Objective  post occipital scalp: Pink excoriated papules  Objective  right posterior neck: 3 mm pink papule  Objective  left trygus x 1, left forehead x 2, right forehead x 2, right cheek x 4 (9): Erythematous thin papules/macules with gritty scale.   Objective  Left calf x 1, right calf x 1, right pretibia x 1 (3): ISK vs wart  Assessment & Plan  Folliculitis post occipital scalp  Start Clobetasol 0.05% solution. Apply 1-2 times daily to affected areas as needed.   Start Clindamycin foam. Apply once a day to affected areas as needed.   Avoid picking. Start OTC H&S shampoo, let sit several minutes prior to rinsing  clobetasol (TEMOVATE) 0.05 % external solution - post occipital scalp  Clindamycin Phosphate foam - post occipital scalp  Neoplasm of uncertain behavior right posterior neck  Skin / nail biopsy Type of biopsy: tangential   Informed consent: discussed and consent obtained   Anesthesia: the lesion was anesthetized in a standard fashion   Anesthesia comment:  Area prepped with  alcohol Anesthetic:  1% lidocaine w/ epinephrine 1-100,000 buffered w/ 8.4% NaHCO3 Instrument used: flexible razor blade   Hemostasis achieved with: pressure, aluminum chloride and electrodesiccation   Outcome: patient tolerated procedure well   Post-procedure details: wound care instructions given   Post-procedure details comment:  Ointment and small bandage applied  Specimen 1 - Surgical pathology Differential Diagnosis: r/o nevus vs BCC Check Margins: No 3 mm pink papule  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  Apply to temples, forehead, and cheeks once a day for 5 days. Discussed resulting erythema/scale with treatment  Sample given in office.  Lot:[id number] Exp:02/2021  Destruction of lesion - left trygus x 1, left forehead x 2, right forehead x 2, right cheek x 4  Destruction method: cryotherapy   Informed consent: discussed and consent obtained   Lesion destroyed using liquid nitrogen: Yes   Region frozen until ice ball extended beyond lesion: Yes   Outcome: patient tolerated procedure well with no complications   Post-procedure details: wound care instructions given    Tirbanibulin (KLISYRI) 1 % OINT - left trygus x 1, left forehead x 2, right forehead x 2, right cheek x 4  Inflamed seborrheic keratosis (3) Left calf x 1, right calf x 1, right pretibia x 1  Destruction  of lesion - Left calf x 1, right calf x 1, right pretibia x 1  Destruction method: cryotherapy   Informed consent: discussed and consent obtained   Lesion destroyed using liquid nitrogen: Yes   Region frozen until ice ball extended beyond lesion: Yes   Outcome: patient tolerated procedure well with no complications   Post-procedure details: wound care instructions given    Return in about 6 months (around 07/29/2020) for AK f/u.   I, Harriett Sine,  CMA, am acting as scribe for Brendolyn Patty, MD.  Documentation: I have reviewed the above documentation for accuracy and completeness, and I agree with the above.  Brendolyn Patty MD

## 2020-01-30 MED ORDER — LORATADINE 10 MG PO TABS: 10.0000 mg | ORAL_TABLET | Freq: Every day | ORAL | 11 refills | Status: DC

## 2020-02-04 ENCOUNTER — Telehealth: Payer: Self-pay

## 2020-02-04 NOTE — Telephone Encounter (Signed)
Advised patient biopsy on the right posterior neck was a benign nevus.

## 2020-02-04 NOTE — Telephone Encounter (Signed)
-----   Message from Brendolyn Patty, MD sent at 02/04/2020  9:00 AM EDT ----- Skin , right posterior neck MELANOCYTIC NEVUS, INTRADERMAL TYPE  Benign mole

## 2020-02-26 ENCOUNTER — Other Ambulatory Visit: Payer: Self-pay

## 2020-02-26 ENCOUNTER — Ambulatory Visit: Payer: BC Managed Care – PPO | Admitting: Dermatology

## 2020-02-26 DIAGNOSIS — L578 Other skin changes due to chronic exposure to nonionizing radiation: Secondary | ICD-10-CM

## 2020-02-26 DIAGNOSIS — R21 Rash and other nonspecific skin eruption: Secondary | ICD-10-CM

## 2020-02-26 NOTE — Patient Instructions (Signed)
Aquaphor or Vaseline - Apply to right earlobe until healed.  CeraVe Renewing Cream - Apply to face once a day until healed.

## 2020-02-26 NOTE — Progress Notes (Signed)
   Follow-Up Visit   Subjective  Tyler Roberts is a 64 y.o. male who presents for the following: Follow-up. Patient had used Klisyri 1% Ointment to face last week Monday-Friday. Saturday morning he woke up and his right ear was swollen, red, and the skin had come off. Area is now crusted. Not painful. He thinks he got some of the ointment to ear.  The following portions of the chart were reviewed this encounter and updated as appropriate:      Review of Systems:  No other skin or systemic complaints except as noted in HPI or Assessment and Plan.  Objective  Well appearing patient in no apparent distress; mood and affect are within normal limits.  A focused examination was performed including face, ear. Relevant physical exam findings are noted in the Assessment and Plan.  Objective  Right Ear: Heme crusted patch of the right earlobe; mild erythema with diffuse scaling of the forehead, temples, cheeks.   Assessment & Plan   Actinic Damage - chronic, secondary to cumulative UV radiation exposure/sun exposure over time - diffuse scaly erythematous macules with underlying dyspigmentation, good result with Klisyri course - Recommend daily broad spectrum sunscreen SPF 30+ to sun-exposed areas, reapply every 2 hours as needed.  - Call for new or changing lesions.  Rash Right Ear  Irritant Reaction 2ndary to Klisyri ointment  Start Aquaphor or Vaseline bid and cover right earlobe until healed. Sample given. CeraVe Renewing Cream to face qd/bid, samples given.  Return as scheduled.  IJamesetta Orleans, CMA, am acting as scribe for Brendolyn Patty, MD .  Documentation: I have reviewed the above documentation for accuracy and completeness, and I agree with the above.  Brendolyn Patty MD

## 2020-03-05 ENCOUNTER — Encounter: Payer: Self-pay | Admitting: Family Medicine

## 2020-03-05 ENCOUNTER — Ambulatory Visit (INDEPENDENT_AMBULATORY_CARE_PROVIDER_SITE_OTHER): Payer: BC Managed Care – PPO | Admitting: Family Medicine

## 2020-03-05 ENCOUNTER — Other Ambulatory Visit: Payer: Self-pay

## 2020-03-05 VITALS — BP 152/80 | HR 82 | Temp 98.7°F | Resp 16 | Ht 70.0 in | Wt 199.0 lb

## 2020-03-05 DIAGNOSIS — Z23 Encounter for immunization: Secondary | ICD-10-CM

## 2020-03-05 DIAGNOSIS — E781 Pure hyperglyceridemia: Secondary | ICD-10-CM | POA: Diagnosis not present

## 2020-03-05 DIAGNOSIS — Z125 Encounter for screening for malignant neoplasm of prostate: Secondary | ICD-10-CM

## 2020-03-05 DIAGNOSIS — Z0000 Encounter for general adult medical examination without abnormal findings: Secondary | ICD-10-CM | POA: Diagnosis not present

## 2020-03-05 DIAGNOSIS — E291 Testicular hypofunction: Secondary | ICD-10-CM | POA: Diagnosis not present

## 2020-03-05 DIAGNOSIS — I10 Essential (primary) hypertension: Secondary | ICD-10-CM

## 2020-03-05 DIAGNOSIS — M7022 Olecranon bursitis, left elbow: Secondary | ICD-10-CM

## 2020-03-05 NOTE — Patient Instructions (Addendum)
. Please review the attached list of medications and notify my office if there are any errors.   . Please bring all of your medications to every appointment so we can make sure that our medication list is the same as yours.    Check your blood pressure at home once a week and let me know if you get readings consistently over 140/90   DASH Eating Plan DASH stands for "Dietary Approaches to Stop Hypertension." The DASH eating plan is a healthy eating plan that has been shown to reduce high blood pressure (hypertension). It may also reduce your risk for type 2 diabetes, heart disease, and stroke. The DASH eating plan may also help with weight loss. What are tips for following this plan?  General guidelines  Avoid eating more than 2,300 mg (milligrams) of salt (sodium) a day. If you have hypertension, you may need to reduce your sodium intake to 1,500 mg a day.  Limit alcohol intake to no more than 1 drink a day for nonpregnant women and 2 drinks a day for men. One drink equals 12 oz of beer, 5 oz of wine, or 1 oz of hard liquor.  Work with your health care provider to maintain a healthy body weight or to lose weight. Ask what an ideal weight is for you.  Get at least 30 minutes of exercise that causes your heart to beat faster (aerobic exercise) most days of the week. Activities may include walking, swimming, or biking.  Work with your health care provider or diet and nutrition specialist (dietitian) to adjust your eating plan to your individual calorie needs. Reading food labels   Check food labels for the amount of sodium per serving. Choose foods with less than 5 percent of the Daily Value of sodium. Generally, foods with less than 300 mg of sodium per serving fit into this eating plan.  To find whole grains, look for the word "whole" as the first word in the ingredient list. Shopping  Buy products labeled as "low-sodium" or "no salt added."  Buy fresh foods. Avoid canned foods and  premade or frozen meals. Cooking  Avoid adding salt when cooking. Use salt-free seasonings or herbs instead of table salt or sea salt. Check with your health care provider or pharmacist before using salt substitutes.  Do not fry foods. Cook foods using healthy methods such as baking, boiling, grilling, and broiling instead.  Cook with heart-healthy oils, such as olive, canola, soybean, or sunflower oil. Meal planning  Eat a balanced diet that includes: ? 5 or more servings of fruits and vegetables each day. At each meal, try to fill half of your plate with fruits and vegetables. ? Up to 6-8 servings of whole grains each day. ? Less than 6 oz of lean meat, poultry, or fish each day. A 3-oz serving of meat is about the same size as a deck of cards. One egg equals 1 oz. ? 2 servings of low-fat dairy each day. ? A serving of nuts, seeds, or beans 5 times each week. ? Heart-healthy fats. Healthy fats called Omega-3 fatty acids are found in foods such as flaxseeds and coldwater fish, like sardines, salmon, and mackerel.  Limit how much you eat of the following: ? Canned or prepackaged foods. ? Food that is high in trans fat, such as fried foods. ? Food that is high in saturated fat, such as fatty meat. ? Sweets, desserts, sugary drinks, and other foods with added sugar. ? Full-fat dairy products.  Do not salt foods before eating.  Try to eat at least 2 vegetarian meals each week.  Eat more home-cooked food and less restaurant, buffet, and fast food.  When eating at a restaurant, ask that your food be prepared with less salt or no salt, if possible. What foods are recommended? The items listed may not be a complete list. Talk with your dietitian about what dietary choices are best for you. Grains Whole-grain or whole-wheat bread. Whole-grain or whole-wheat pasta. Brown rice. [place]. Bulgur. Whole-grain and low-sodium cereals. Pita bread. Low-fat, low-sodium crackers. Whole-wheat  flour tortillas. Vegetables Fresh or frozen vegetables (raw, steamed, roasted, or grilled). Low-sodium or reduced-sodium tomato and vegetable juice. Low-sodium or reduced-sodium tomato sauce and tomato paste. Low-sodium or reduced-sodium canned vegetables. Fruits All fresh, dried, or frozen fruit. Canned fruit in natural juice (without added sugar). Meat and other protein foods Skinless chicken or [place]. Ground chicken or [place]. Pork with fat trimmed off. Fish and seafood. Egg whites. Dried beans, peas, or lentils. Unsalted nuts, nut butters, and seeds. Unsalted canned beans. Lean cuts of beef with fat trimmed off. Low-sodium, lean deli meat. Dairy Low-fat (1%) or fat-free (skim) milk. Fat-free, low-fat, or reduced-fat cheeses. Nonfat, low-sodium ricotta or cottage cheese. Low-fat or nonfat yogurt. Low-fat, low-sodium cheese. Fats and oils Soft margarine without trans fats. Vegetable oil. Low-fat, reduced-fat, or light mayonnaise and salad dressings (reduced-sodium). Canola, safflower, olive, soybean, and sunflower oils. Avocado. Seasoning and other foods Herbs. Spices. Seasoning mixes without salt. Unsalted popcorn and pretzels. Fat-free sweets. What foods are not recommended? The items listed may not be a complete list. Talk with your dietitian about what dietary choices are best for you. Grains Baked goods made with fat, such as croissants, muffins, or some breads. Dry pasta or rice meal packs. Vegetables Creamed or fried vegetables. Vegetables in a cheese sauce. Regular canned vegetables (not low-sodium or reduced-sodium). Regular canned tomato sauce and paste (not low-sodium or reduced-sodium). Regular tomato and vegetable juice (not low-sodium or reduced-sodium). Angie Fava. Olives. Fruits Canned fruit in a light or heavy syrup. Fried fruit. Fruit in cream or butter sauce. Meat and other protein foods Fatty cuts of meat. Ribs. Fried meat. Berniece Salines. Sausage. Bologna and other processed lunch  meats. Salami. Fatback. Hotdogs. Bratwurst. Salted nuts and seeds. Canned beans with added salt. Canned or smoked fish. Whole eggs or egg yolks. Chicken or [place] with skin. Dairy Whole or 2% milk, cream, and half-and-half. Whole or full-fat cream cheese. Whole-fat or sweetened yogurt. Full-fat cheese. Nondairy creamers. Whipped toppings. Processed cheese and cheese spreads. Fats and oils Butter. Stick margarine. Lard. Shortening. Ghee. Bacon fat. Tropical oils, such as coconut, palm kernel, or palm oil. Seasoning and other foods Salted popcorn and pretzels. Onion salt, garlic salt, seasoned salt, table salt, and sea salt. Worcestershire sauce. Tartar sauce. Barbecue sauce. Teriyaki sauce. Soy sauce, including reduced-sodium. Steak sauce. Canned and packaged gravies. Fish sauce. Oyster sauce. Cocktail sauce. Horseradish that you find on the shelf. Ketchup. Mustard. Meat flavorings and tenderizers. Bouillon cubes. Hot sauce and Tabasco sauce. Premade or packaged marinades. Premade or packaged taco seasonings. Relishes. Regular salad dressings. Where to find more information:  National Heart, Lung, and [place]: https://wilson-eaton.com/  American Heart Association: www.heart.org Summary  The DASH eating plan is a healthy eating plan that has been shown to reduce high blood pressure (hypertension). It may also reduce your risk for type 2 diabetes, heart disease, and stroke.  With the DASH eating plan, you should limit salt (sodium)  intake to 2,300 mg a day. If you have hypertension, you may need to reduce your sodium intake to 1,500 mg a day.  When on the DASH eating plan, aim to eat more fresh fruits and vegetables, whole grains, lean proteins, low-fat dairy, and heart-healthy fats.  Work with your health care provider or diet and nutrition specialist (dietitian) to adjust your eating plan to your individual calorie needs. This information is not intended to replace advice given to you by your  health care provider. Make sure you discuss any questions you have with your health care provider. Document Revised: 03/04/2017 Document Reviewed: 03/15/2016 Elsevier Patient Education  2020 [employer].

## 2020-03-05 NOTE — Progress Notes (Signed)
Complete physical exam   Patient: Tyler Roberts   DOB: [birth year]   64 y.o. Male  MRN: [id number] Visit Date: 03/05/2020  Today's healthcare provider: Lelon Huh, MD   Chief Complaint  Patient presents with  . Annual Exam  . Hyperlipidemia  . Hypertension  . Sleep Apnea   Subjective    Tyler Roberts is a 64 y.o. male who presents today for a complete physical exam.  He reports consuming a general diet. The patient does not participate in regular exercise at present. He generally feels well. He reports sleeping well. He does not have additional problems to discuss today.  HPI  Hypertension, follow-up  BP Readings from Last 3 Encounters:  03/05/20 (!) 156/86  02/28/19 138/82  02/23/19 140/88   Wt Readings from Last 3 Encounters:  03/05/20 199 lb (90.3 kg)  02/28/19 189 lb 6.4 oz (85.9 kg)  02/23/19 187 lb 6.4 oz (85 kg)     He was last seen for hypertension 1 years ago.  BP at that visit was 138/82. Management since that visit includes continuing same medication.  He reports excellent compliance with treatment. He is not having side effects.  He is following a Regular diet. He is not exercising. He does not smoke.  Use of agents associated with hypertension: NSAIDS.   Outside blood pressures are not being checked. Symptoms: No chest pain No chest pressure  No palpitations No syncope  No dyspnea No orthopnea  No paroxysmal nocturnal dyspnea No lower extremity edema   Pertinent labs: Lab Results  Component Value Date   CHOL 166 02/28/2019   HDL 41 02/28/2019   LDLCALC 91 02/28/2019   TRIG 197 (H) 02/28/2019   CHOLHDL 4.0 02/28/2019   Lab Results  Component Value Date   NA 143 02/28/2019   K 4.3 02/28/2019   CREATININE 0.88 02/28/2019   GFRNONAA 91 02/28/2019   GFRAA 106 02/28/2019   GLUCOSE 99 02/28/2019     The 10-year ASCVD risk score Mikey Bussing DC Jr., et al., 2013) is: 18.7%    ---------------------------------------------------------------------------------------------------  Lipid/Cholesterol, Follow-up  Last lipid panel Other pertinent labs  Lab Results  Component Value Date   CHOL 166 02/28/2019   HDL 41 02/28/2019   LDLCALC 91 02/28/2019   TRIG 197 (H) 02/28/2019   CHOLHDL 4.0 02/28/2019   Lab Results  Component Value Date   ALT 27 02/28/2019   AST 23 02/28/2019   PLT 266 02/28/2019   TSH 1.78 12/15/2016     He was last seen for this 1 year ago.  Management since that visit includes continuing same medication.  He reports excellent compliance with treatment. He is not having side effects.   Symptoms: No chest pain No chest pressure/discomfort  No dyspnea No lower extremity edema  No numbness or tingling of extremity No orthopnea  No palpitations No paroxysmal nocturnal dyspnea  No speech difficulty No syncope   Current diet: in general, a "healthy" diet   Current exercise: gardening, housecleaning, no regular exercise, walking and yard work  The 10-year ASCVD risk score Mikey Bussing DC Jr., et al., 2013) is: 18.7%  ---------------------------------------------------------------------------------------------------  Follow up for OSA:  The patient was last seen for this 1 year ago. Changes made at last visit include none; using CPAP consistently every night and medically benefiting from its use.   He reports excellent compliance with treatment. He feels that condition is Improved. He is not having side effects.   ----------------------------------------------------------------------------------------- His only complaint  today is that he hit his left elbow a few months ago and is still slightly swollen, but not red or painful.  Past Medical History:  Diagnosis Date  . Shingles 09/26/2014   Past Surgical History:  Procedure Laterality Date  . HERNIA REPAIR  1990  . KNEE SURGERY Right 2008   Due to a staph infection  . LASIK    .  NOSE SURGERY  2002  . TONSILLECTOMY AND ADENOIDECTOMY  2002   Social History   Socioeconomic History  . Marital status: Married    Spouse name: Not on file  . Number of children: 1  . Years of education: H/S  . Highest education level: Not on file  Occupational History  . Occupation: Radiographer, therapeutic: [employer]  Tobacco Use  . Smoking status: Former Smoker    Types: Cigars    Quit date: 04/05/2017    Years since quitting: 2.9  . Smokeless tobacco: Never Used  . Tobacco comment: occasionally smoked intermittenlty for a few years  Substance and Sexual Activity  . Alcohol use: Yes    Alcohol/week: 0.0 standard drinks    Comment: Occasional  . Drug use: No  . Sexual activity: Not on file  Other Topics Concern  . Not on file  Social History Narrative  . Not on file   Social Determinants of Health   Financial Resource Strain:   . Difficulty of Paying Living Expenses: Not on file  Food Insecurity:   . Worried About Charity fundraiser in the Last Year: Not on file  . Ran Out of Food in the Last Year: Not on file  Transportation Needs:   . Lack of Transportation (Medical): Not on file  . Lack of Transportation (Non-Medical): Not on file  Physical Activity:   . Days of Exercise per Week: Not on file  . Minutes of Exercise per Session: Not on file  Stress:   . Feeling of Stress : Not on file  Social Connections:   . Frequency of Communication with Friends and Family: Not on file  . Frequency of Social Gatherings with Friends and Family: Not on file  . Attends Religious Services: Not on file  . Active Member of Clubs or Organizations: Not on file  . Attends Archivist Meetings: Not on file  . Marital Status: Not on file  Intimate Partner Violence:   . Fear of Current or Ex-Partner: Not on file  . Emotionally Abused: Not on file  . Physically Abused: Not on file  . Sexually Abused: Not on file   Family Status  Relation Name  Status  . Mother  Deceased  . Father  Deceased  . Sister  Deceased  . Brother  Alive  . Sister  Alive  . Sister  Alive   Family History  Problem Relation Age of Onset  . Anuerysm Mother        Brain Aneurysm  . Heart attack Father   . Heart disease Father 72's  . Ovarian cancer Sister   . Healthy Brother   . Healthy Sister   . Healthy Sister    Allergies  Allergen Reactions  . Amlodipine Swelling    Facial swelling  . Atorvastatin     weakness  . Fenofibrate     Other reaction(s): Muscle Pain Elevated CK  . Gemfibrozil     Other reaction(s): Muscle Pain  . Niacin     Flushing  . Lotensin [  Benazepril] Rash    Patient Care Team: [name], MD as PCP - General (Family Medicine) Brendolyn Patty, MD (Dermatology) Marlaine Hind, MD as Consulting Physician (Physical Medicine and Rehabilitation) [place], [facility]   Medications: Outpatient Medications Prior to Visit  Medication Sig  . aspirin 81 MG tablet   . Clindamycin Phosphate foam Apply once a day to aa's as needed  . clobetasol (TEMOVATE) 0.05 % external solution Apply 1-2 times daily to aa's as needed  . esomeprazole (NEXIUM) 40 MG capsule TAKE 1 CAPSULE BY MOUTH ONCE DAILY  . flunisolide (NASALIDE) 25 MCG/ACT (0.025%) SOLN USE 1 SPRAY IN EACH NOSTRIL DAILY AS DIRECTED  . irbesartan-hydrochlorothiazide (AVALIDE) 150-12.5 MG tablet TAKE ONE TABLET EVERY DAY  . loratadine (CLARITIN) 10 MG tablet Take 1 tablet (10 mg total) by mouth daily.  Marland Kitchen lovastatin (MEVACOR) 20 MG tablet TAKE 1 TABLET BY MOUTH DAILY  . meloxicam (MOBIC) 15 MG tablet TAKE ONE TABLET BY MOUTH EVERY DAY WITH A MEAL  . metoprolol succinate (TOPROL-XL) 25 MG 24 hr tablet TAKE 1/2 TABLET BY MOUTH DAILY  . Testosterone 10 MG/ACT (2%) GEL APPLY 4 PUMPS EVERY DAY AS DIRECTED  . Tirbanibulin (KLISYRI) 1 % OINT Apply to temples, forehead, and cheeks once a day for 5 days.  Marland Kitchen triamcinolone cream (KENALOG) 0.1 % APPLY TO THE AFFECTED AREA ONCE  OR TWICEA DAY AS NEEDED FOR ITCH. AVOID FACE, GROIN, AXILLA   No facility-administered medications prior to visit.    Review of Systems  Constitutional: Negative.   HENT: Positive for sinus pressure.   Eyes: Negative.   Respiratory: Positive for apnea.   Cardiovascular: Negative.   Gastrointestinal: Negative.   Endocrine: Negative.   Genitourinary: Negative.   Musculoskeletal: Positive for arthralgias and back pain.  Skin: Negative.   Allergic/Immunologic: Positive for environmental allergies.  Neurological: Negative.   Hematological: Negative.   Psychiatric/Behavioral: Negative.        Objective    BP (!) 156/86 (BP Location: Right Arm, Patient Position: Sitting, Cuff Size: Normal)   Pulse 82   Temp 98.7 F (37.1 C) (Oral)   Resp 16   Ht 5\' 10"  (1.778 m)   Wt 199 lb (90.3 kg)   SpO2 99%   BMI 28.55 kg/m     Physical Exam    General Appearance:     Overweight male. Alert, cooperative, in no acute distress, appears stated age  Head:    Normocephalic, without obvious abnormality, atraumatic  Eyes:    PERRL, conjunctiva/corneas clear, EOM's intact, fundi    benign, both eyes       Ears:    Normal TM's and external ear canals, both ears  Neck:   Supple, symmetrical, trachea midline, no adenopathy;       thyroid:  No enlargement/tenderness/nodules; no carotid   bruit or JVD  Back:     Symmetric, no curvature, ROM normal, no CVA tenderness  Lungs:     Clear to auscultation bilaterally, respirations unlabored  Chest wall:    No tenderness or deformity  Heart:    Normal heart rate. Normal rhythm. No murmurs, rubs, or gallops.  S1 and S2 normal  Abdomen:     Soft, non-tender, bowel sounds active all four quadrants,    no masses, no organomegaly  Genitalia:    deferred  Rectal:    deferred  Extremities:   All extremities are intact. No cyanosis or edema. Slight swelling over left olecranon, very slightly tender, no erythema, FROM  of elbow.   Pulses:   2+ and symmetric  all extremities  Skin:   Skin color, texture, turgor normal, no rashes or lesions  Lymph nodes:   Cervical, supraclavicular, and axillary nodes normal  Neurologic:   CNII-XII intact. Normal strength, sensation and reflexes      throughout     Last depression screening scores PHQ 2/9 Scores 03/05/2020 02/28/2019 12/22/2017  PHQ - 2 Score 0 0 0  PHQ- 9 Score 0 - 0   Last fall risk screening Fall Risk  03/05/2020  Falls in the past year? 0  Number falls in past yr: 0  Injury with Fall? 0   Last Audit-C alcohol use screening Alcohol Use Disorder Test (AUDIT) 03/05/2020  1. How often do you have a drink containing alcohol? 2  2. How many drinks containing alcohol do you have on a typical day when you are drinking? 0  3. How often do you have six or more drinks on one occasion? 0  AUDIT-C Score 2  4. How often during the last year have you found that you were not able to stop drinking once you had started? -  5. How often during the last year have you failed to do what was normally expected from you because of drinking? -  6. How often during the last year have you needed a first drink in the morning to get yourself going after a heavy drinking session? -  7. How often during the last year have you had a feeling of guilt of remorse after drinking? -  8. How often during the last year have you been unable to remember what happened the night before because you had been drinking? -  9. Have you or someone else been injured as a result of your drinking? -  10. Has a relative or friend or a doctor or another health worker been concerned about your drinking or suggested you cut down? -  Alcohol Use Disorder Identification Test Final Score (AUDIT) -  Alcohol Brief Interventions/Follow-up AUDIT Score <7 follow-up not indicated   A score of 3 or more in women, and 4 or more in men indicates increased risk for alcohol abuse, EXCEPT if all of the points are from question 1   No results found for any  visits on 03/05/20.  Assessment & Plan    Routine Health Maintenance and Physical Exam  Exercise Activities and Dietary recommendations Goals   None     Immunization History  Administered Date(s) Administered  . Influenza,inj,Quad PF,6+ Mos 12/22/2017, 01/26/2019  . Influenza-Unspecified 01/17/2017  . PFIZER SARS-COV-2 Vaccination 07/12/2019, 08/07/2019  . Td 12/22/2017  . Tdap 07/25/2007  . Zoster Recombinat (Shingrix) 12/15/2016, 02/16/2017    Health Maintenance  Topic Date Due  . HIV Screening  Never done  . INFLUENZA VACCINE  11/04/2019  . COLONOSCOPY  12/06/2020  . TETANUS/TDAP  12/23/2027  . COVID-19 Vaccine  Completed  . Hepatitis C Screening  Completed    Discussed health benefits of physical activity, and encouraged him to engage in regular exercise appropriate for his age and condition.  2. Benign essential hypertension He reports that his wife is a nurse and checks BP at home, is usually in the 120s to 130s.  - EKG 12-Lead  3. Hyperglyceridemia, pure He is tolerating lovastatin well with no adverse effects.   - CBC - Comprehensive metabolic panel - Lipid panel  4. Testicular hypofunction He feels energy levels, mood and general sense of well-being is  improved on testosterone replacement.  - Testosterone,Free and Total  5. Olecranon bursitis of left elbow Mild, encouraged regular application of ice packs when it flares, may take several months to resolve.   6. Prostate cancer screening  - PSA Total (Reflex To Free) ([facility] only)  7. Need for influenza vaccination  - Flu Vaccine QUAD 36+ mos IM (Fluarix/Fluzone)  8. Encounter for immunization  - [employer] Vaccine       The entirety of the information documented in the History of Present Illness, Review of Systems and Physical Exam were personally obtained by me. Portions of this information were initially documented by the CMA and reviewed by me for thoroughness and accuracy.       Lelon Huh, MD  [facility] [phone] (phone) [phone] (fax)  [place]

## 2020-03-08 LAB — COMPREHENSIVE METABOLIC PANEL
ALT: 35 IU/L (ref 0–44)
AST: 29 IU/L (ref 0–40)
Albumin/Globulin Ratio: 2.7 — ABNORMAL HIGH (ref 1.2–2.2)
Albumin: 4.8 g/dL (ref 3.8–4.8)
Alkaline Phosphatase: 63 IU/L (ref 44–121)
BUN/Creatinine Ratio: 19 (ref 10–24)
BUN: 17 mg/dL (ref 8–27)
Bilirubin Total: 0.9 mg/dL (ref 0.0–1.2)
CO2: 24 mmol/L (ref 20–29)
Calcium: 9.7 mg/dL (ref 8.6–10.2)
Chloride: 102 mmol/L (ref 96–106)
Creatinine, Ser: 0.91 mg/dL (ref 0.76–1.27)
GFR calc Af Amer: 103 mL/min/{1.73_m2} (ref >59)
GFR calc non Af Amer: 89 mL/min/{1.73_m2} (ref >59)
Globulin, Total: 1.8 g/dL (ref 1.5–4.5)
Glucose: 109 mg/dL — ABNORMAL HIGH (ref 65–99)
Potassium: 4.4 mmol/L (ref 3.5–5.2)
Sodium: 140 mmol/L (ref 134–144)
Total Protein: 6.6 g/dL (ref 6.0–8.5)

## 2020-03-08 LAB — CBC
Hematocrit: 44.7 % (ref 37.5–51.0)
Hemoglobin: 15.1 g/dL (ref 13.0–17.7)
MCH: 30 pg (ref 26.6–33.0)
MCHC: 33.8 g/dL (ref 31.5–35.7)
MCV: 89 fL (ref 79–97)
Platelets: 251 10*3/uL (ref 150–450)
RBC: 5.03 x10E6/uL (ref 4.14–5.80)
RDW: 12.9 % (ref 11.6–15.4)
WBC: 6.8 10*3/uL (ref 3.4–10.8)

## 2020-03-08 LAB — TESTOSTERONE,FREE AND TOTAL
Testosterone, Free: 12.5 pg/mL (ref 6.6–18.1)
Testosterone: 423 ng/dL (ref 264–916)

## 2020-03-08 LAB — LIPID PANEL
Chol/HDL Ratio: 4.7 ratio (ref 0.0–5.0)
Cholesterol, Total: 201 mg/dL — ABNORMAL HIGH (ref 100–199)
HDL: 43 mg/dL (ref >39)
LDL Chol Calc (NIH): 106 mg/dL — ABNORMAL HIGH (ref 0–99)
Triglycerides: 304 mg/dL — ABNORMAL HIGH (ref 0–149)
VLDL Cholesterol Cal: 52 mg/dL — ABNORMAL HIGH (ref 5–40)

## 2020-03-08 LAB — PSA TOTAL (REFLEX TO FREE): Prostate Specific Ag, Serum: 0.9 ng/mL (ref 0.0–4.0)

## 2020-03-12 ENCOUNTER — Other Ambulatory Visit: Payer: Self-pay | Admitting: Family Medicine

## 2020-03-12 DIAGNOSIS — I10 Essential (primary) hypertension: Secondary | ICD-10-CM

## 2020-03-27 ENCOUNTER — Other Ambulatory Visit: Payer: Self-pay | Admitting: Family Medicine

## 2020-03-27 ENCOUNTER — Telehealth: Payer: Self-pay

## 2020-03-27 ENCOUNTER — Telehealth: Payer: Self-pay | Admitting: Family Medicine

## 2020-03-27 DIAGNOSIS — H9202 Otalgia, left ear: Secondary | ICD-10-CM

## 2020-03-27 MED ORDER — AMOXICILLIN 875 MG PO TABS: 875.0000 mg | ORAL_TABLET | Freq: Two times a day (BID) | ORAL | 0 refills | Status: DC

## 2020-03-27 NOTE — Telephone Encounter (Signed)
Wife reports patient is having left ear pain, puffiness around left eye denies any fever, chills, or cough. Reports patient has been taking OTC decongestants medications, Advil x 3 days and nasal spray daily reports mild symptom control. She reports patient is eating and drinking well and activity is not affected by symptoms.

## 2020-03-27 NOTE — Telephone Encounter (Signed)
Copied from [place] [id number]. Topic: General - Other >> Mar 27, 2020 12:37 PM Rainey Pines A wrote: Patient stated they missed a call from [facility] and would like a callback at [phone]

## 2020-03-27 NOTE — Telephone Encounter (Signed)
Left message to call back  

## 2020-03-27 NOTE — Telephone Encounter (Signed)
Symptoms sound like URI with left ear infection. Will send Amoxil 875 mg BID #20 to the [facility]. If no better in 3-4 days, should schedule appointment for evaluation.

## 2020-03-27 NOTE — Telephone Encounter (Signed)
Slight improvement in earache with starting the Amoxicillin today. Continue decongestant and may add Flonase. Recheck in 5-6 days if no beter.

## 2020-03-27 NOTE — Telephone Encounter (Signed)
Pts wife called stating that the pt is experiencing headache on the left side, teeth hurting, blood tinge when blowing nose x 3 days. She denied an appt and is requesting to have an antibiotic sent in for pt. Please advise.       [facility] - [place], [place] - [address]  [place] [address]  Phone: [phone] Fax: [phone]  Hours: Not open 24 hours

## 2020-04-02 ENCOUNTER — Other Ambulatory Visit: Payer: Self-pay | Admitting: Family Medicine

## 2020-04-02 NOTE — Telephone Encounter (Signed)
Requested medication (s) are due for refill today: yes  Requested medication (s) are on the active medication list: yes  Last refill: 09/10/19  60g  5 refills  Future visit scheduled no  Notes to clinic: not delegated  Requested Prescriptions  Pending Prescriptions Disp Refills   Testosterone 10 MG/ACT (2%) GEL [Pharmacy Med Name: TESTOSTERONE 10 MG GEL PUMP] 60 g     Sig: APPLY 4 PUMPS EVERY DAY AS DIRECTED      Off-Protocol Failed - 04/02/2020  1:21 AM      Failed - Medication not assigned to a protocol, review manually.      Passed - Valid encounter within last 12 months    Recent Outpatient Visits           4 weeks ago Annual physical exam   [facility] Malva Limes, MD   1 year ago Annual physical exam   [facility] Malva Limes, MD   1 year ago Abscess   [facility] Trey Sailors, [place]   2 years ago Annual physical exam   [facility] Malva Limes, MD   2 years ago Dizziness   [facility] Malva Limes, MD       Future Appointments             In 3 months Willeen Niece, MD [facility]

## 2020-04-27 ENCOUNTER — Other Ambulatory Visit: Payer: Self-pay | Admitting: Family Medicine

## 2020-04-27 DIAGNOSIS — I10 Essential (primary) hypertension: Secondary | ICD-10-CM

## 2020-04-27 NOTE — Telephone Encounter (Signed)
Requested Prescriptions  Pending Prescriptions Disp Refills  . metoprolol succinate (TOPROL-XL) 25 MG 24 hr tablet [Pharmacy Med Name: METOPROLOL SUCCINATE ER 25 MG TAB] 90 tablet 1    Sig: TAKE 1/2 TABLET BY MOUTH DAILY     Cardiovascular:  Beta Blockers Failed - 04/27/2020 11:06 AM      Failed - Last BP in normal range    BP Readings from Last 1 Encounters:  03/05/20 (!) 152/80         Passed - Last Heart Rate in normal range    Pulse Readings from Last 1 Encounters:  03/05/20 82         Passed - Valid encounter within last 6 months    Recent Outpatient Visits          1 month ago Annual physical exam   [facility] [name], MD   1 year ago Annual physical exam   [facility] [name], MD   1 year ago [place] Trinna Post, [place]   2 years ago Annual physical exam   [facility] [name], MD   2 years ago Brooks, Donald E, MD      Future Appointments            In 2 months Brendolyn Patty, MD [place]

## 2020-06-09 ENCOUNTER — Other Ambulatory Visit: Payer: Self-pay | Admitting: Family Medicine

## 2020-06-09 DIAGNOSIS — I10 Essential (primary) hypertension: Secondary | ICD-10-CM

## 2020-06-30 ENCOUNTER — Other Ambulatory Visit: Payer: Self-pay | Admitting: Family Medicine

## 2020-07-08 ENCOUNTER — Encounter: Payer: Self-pay | Admitting: Dermatology

## 2020-07-08 ENCOUNTER — Other Ambulatory Visit: Payer: Self-pay

## 2020-07-08 ENCOUNTER — Ambulatory Visit: Payer: BC Managed Care – PPO | Admitting: Dermatology

## 2020-07-08 DIAGNOSIS — L821 Other seborrheic keratosis: Secondary | ICD-10-CM

## 2020-07-08 DIAGNOSIS — L570 Actinic keratosis: Secondary | ICD-10-CM

## 2020-07-08 DIAGNOSIS — L578 Other skin changes due to chronic exposure to nonionizing radiation: Secondary | ICD-10-CM

## 2020-07-08 NOTE — Patient Instructions (Addendum)
Cryotherapy Aftercare  . Wash gently with soap and water everyday.   Marland Kitchen Apply Vaseline and Band-Aid daily until healed.  Recommend daily broad spectrum sunscreen SPF 30+ to sun-exposed areas, reapply every 2 hours as needed. Call for new or changing lesions.  Staying in the shade or wearing long sleeves, sun glasses (UVA+UVB protection) and wide brim hats (4-inch brim around the entire circumference of the hat) are also recommended for sun protection.   If you have any questions or concerns for your doctor, please call our main line at [phone] and press option 4 to reach your doctor's medical assistant. If no one answers, please leave a voicemail as directed and we will return your call as soon as possible. Messages left after 4 pm will be answered the following business day.   You may also send [place] a message via [place]. We typically respond to MyChart messages within 1-2 business days.  For prescription refills, please ask your pharmacy to contact our office. Our fax number is [phone].  If you have an urgent issue when the clinic is closed that cannot wait until the next business day, you can page your doctor at the number below.    Please note that while we do our best to be available for urgent issues outside of office hours, we are not available 24/7.   If you have an urgent issue and are unable to reach [place], you may choose to seek medical care at your doctor's office, retail clinic, urgent care center, or emergency room.  If you have a medical emergency, please immediately call 911 or go to the emergency department.  Pager Numbers  - Dr. Nehemiah Massed: [phone]  - Dr. Laurence Ferrari: [phone]  - Dr. Nicole Kindred: [phone]  In the event of inclement weather, please call our main line at [phone] for an update on the status of any delays or closures.  Dermatology Medication Tips: Please keep the boxes that topical medications come in in order to help keep track of the  instructions about where and how to use these. Pharmacies typically print the medication instructions only on the boxes and not directly on the medication tubes.   If your medication is too expensive, please contact our office at [phone] option 4 or send [place] a message through [place].   We are unable to tell what your co-pay for medications will be in advance as this is different depending on your insurance coverage. However, we may be able to find a substitute medication at lower cost or fill out paperwork to get insurance to cover a needed medication.   If a prior authorization is required to get your medication covered by your insurance company, please allow [place] 1-2 business days to complete this process.  Drug prices often vary depending on where the prescription is filled and some pharmacies may offer cheaper prices.  The website www.goodrx.com contains coupons for medications through different pharmacies. The prices here do not account for what the cost may be with help from insurance (it may be cheaper with your insurance), but the website can give you the price if you did not use any insurance.  - You can print the associated coupon and take it with your prescription to the pharmacy.  - You may also stop by our office during regular business hours and pick up a GoodRx coupon card.  - If you need your prescription sent electronically to a different pharmacy, notify our office through [facility] or by phone at [phone]  option 4.

## 2020-07-08 NOTE — Progress Notes (Signed)
   Follow-Up Visit   Subjective  Tyler Roberts is a 65 y.o. male who presents for the following: Follow-up (Patient here for 6 month follow-up Aks of the face. He has used Klisyri ointment to treat Aks. One small scaly spot noticed on the forehead.).   The following portions of the chart were reviewed this encounter and updated as appropriate:       Review of Systems:  No other skin or systemic complaints except as noted in HPI or Assessment and Plan.  Objective  Well appearing patient in no apparent distress; mood and affect are within normal limits.  A focused examination was performed including face, scalp, arms. Relevant physical exam findings are noted in the Assessment and Plan.  Objective  R upper forehead x 1, central upper forehead x 2 (3): Erythematous thin papules/macules with gritty scale.   Objective  Left Temple Hairline: 3.0 x 2.61mm dark brown waxy macule   Assessment & Plan   Actinic Damage - chronic, secondary to cumulative UV radiation exposure/sun exposure over time - diffuse scaly erythematous macules with underlying dyspigmentation - Recommend daily broad spectrum sunscreen SPF 30+ to sun-exposed areas, reapply every 2 hours as needed.  - Recommend staying in the shade or wearing long sleeves, sun glasses (UVA+UVB protection) and wide brim hats (4-inch brim around the entire circumference of the hat). - Call for new or changing lesions.  Seborrheic Keratoses - Stuck-on, waxy, tan-brown papules and/or plaques, including right eyebrow and left infraocular - Benign-appearing - Discussed benign etiology and prognosis. - Observe - Call for any changes       AK (actinic keratosis) (3) R upper forehead x 1, central upper forehead x 2  Improved from Klisyri ointment treatment.  Prior to procedure, discussed risks of blister formation, small wound, skin dyspigmentation, or rare scar following cryotherapy.    Destruction of lesion - R upper forehead x  1, central upper forehead x 2  Destruction method: cryotherapy   Informed consent: discussed and consent obtained   Lesion destroyed using liquid nitrogen: Yes   Region frozen until ice ball extended beyond lesion: Yes   Outcome: patient tolerated procedure well with no complications   Post-procedure details: wound care instructions given    Seborrheic keratosis Left Temple Hairline  Benign-appearing, observe.    Not new per patient. No changes noted.  Will recheck on f/up  Return in about 6 months (around 01/07/2021) for UBSE.   IJamesetta Orleans, CMA, am acting as scribe for Brendolyn Patty, MD .  Documentation: I have reviewed the above documentation for accuracy and completeness, and I agree with the above.  Brendolyn Patty MD

## 2020-07-18 ENCOUNTER — Encounter: Payer: Self-pay | Admitting: Family Medicine

## 2020-09-05 ENCOUNTER — Other Ambulatory Visit: Payer: Self-pay | Admitting: Family Medicine

## 2020-09-05 DIAGNOSIS — I10 Essential (primary) hypertension: Secondary | ICD-10-CM

## 2020-09-05 NOTE — Telephone Encounter (Signed)
   Notes to clinic: review for refill Physical appt was 12/1/202  No follow up mention in note Overdue for 6 month follow up per protocol   Requested Prescriptions  Pending Prescriptions Disp Refills   irbesartan-hydrochlorothiazide (AVALIDE) 150-12.5 MG tablet [Pharmacy Med Name: IRBESARTAN-HCTZ 150-12.5 MG TAB] 90 tablet 0    Sig: TAKE ONE TABLET EVERY DAY      Cardiovascular: ARB + Diuretic Combos Failed - 09/05/2020  8:38 AM      Failed - K in normal range and within 180 days    Potassium  Date Value Ref Range Status  03/05/2020 4.4 3.5 - 5.2 mmol/L Final          Failed - Na in normal range and within 180 days    Sodium  Date Value Ref Range Status  03/05/2020 140 134 - 144 mmol/L Final          Failed - Cr in normal range and within 180 days    Creat  Date Value Ref Range Status  02/16/2017 0.82 0.70 - 1.25 mg/dL Final    Comment:    For patients >32 years of age, the reference limit for Creatinine is approximately 13% higher for people identified as African-American. .    Creatinine, Ser  Date Value Ref Range Status  03/05/2020 0.91 0.76 - 1.27 mg/dL Final          Failed - Ca in normal range and within 180 days    Calcium  Date Value Ref Range Status  03/05/2020 9.7 8.6 - 10.2 mg/dL Final          Failed - Last BP in normal range    BP Readings from Last 1 Encounters:  03/05/20 (!) 152/80          Failed - Valid encounter within last 6 months    Recent Outpatient Visits           6 months ago Annual physical exam   [facility] [name], MD   1 year ago Annual physical exam   [facility] [name], MD   1 year ago [place], [place], [place]   2 years ago Annual physical exam   [facility] [name], MD   2 years ago [place], Donald E, MD       Future Appointments             In 4 months Brendolyn Patty, MD [place] - Patient is not pregnant

## 2020-10-04 ENCOUNTER — Other Ambulatory Visit: Payer: Self-pay | Admitting: Family Medicine

## 2020-10-04 NOTE — Telephone Encounter (Signed)
last RF 06/30/20 #90 1 RF

## 2020-10-28 ENCOUNTER — Other Ambulatory Visit: Payer: Self-pay | Admitting: Family Medicine

## 2020-10-28 DIAGNOSIS — I10 Essential (primary) hypertension: Secondary | ICD-10-CM

## 2020-10-28 NOTE — Telephone Encounter (Signed)
Ok to refill for 90 days  

## 2020-10-28 NOTE — Telephone Encounter (Signed)
  Notes to clinic:  Physical was 03/05/2020 Review for future refill Medication filled today for 90 day    Requested Prescriptions  Pending Prescriptions Disp Refills   metoprolol succinate (TOPROL-XL) 25 MG 24 hr tablet [Pharmacy Med Name: METOPROLOL SUCCINATE ER 25 MG TAB] 90 tablet 1    Sig: TAKE 1/2 TABLET BY MOUTH DAILY      Cardiovascular:  Beta Blockers Failed - 10/28/2020  8:10 AM      Failed - Last BP in normal range    BP Readings from Last 1 Encounters:  03/05/20 (!) 152/80          Failed - Valid encounter within last 6 months    Recent Outpatient Visits           7 months ago Annual physical exam   [facility] [name], MD   1 year ago Annual physical exam   [facility] [name], MD   1 year ago [place], [place], [place]   2 years ago Annual physical exam   [facility] [name], MD   3 years ago [place], [place]       Future Appointments             In 3 months Brendolyn Patty, MD [place] in normal range    Pulse Readings from Last 1 Encounters:  03/05/20 82

## 2020-10-29 ENCOUNTER — Other Ambulatory Visit: Payer: Self-pay | Admitting: Family Medicine

## 2020-11-28 ENCOUNTER — Telehealth: Payer: Self-pay | Admitting: Family Medicine

## 2020-11-28 ENCOUNTER — Other Ambulatory Visit: Payer: Self-pay | Admitting: Family Medicine

## 2020-11-28 ENCOUNTER — Telehealth: Payer: Self-pay | Admitting: *Deleted

## 2020-11-28 DIAGNOSIS — K219 Gastro-esophageal reflux disease without esophagitis: Secondary | ICD-10-CM

## 2020-11-28 NOTE — Telephone Encounter (Signed)
Need to know which specific medications need refill. Some of his medications have refills remaining. Need PEC agent to ask patient which medications are needed. OK for PEC to speak with patient to collect this information.

## 2020-11-28 NOTE — Telephone Encounter (Signed)
I called pt and left a voicemail to call [facility] and schedule an appt for his check up.  I let him know I would give him courtesy refill on his Nexium so he would not run out of this requested refill.

## 2020-11-28 NOTE — Telephone Encounter (Signed)
Caller states patient see PCP once a year and would like all medications filled until physical appointment scheduled on 03/27/2021  [facility] [place], [place] - [address] [place]  [address] [place], [place] [address]  Phone:  [phone]  Fax:  [phone]

## 2020-12-03 ENCOUNTER — Other Ambulatory Visit: Payer: Self-pay | Admitting: Family Medicine

## 2020-12-03 DIAGNOSIS — I10 Essential (primary) hypertension: Secondary | ICD-10-CM

## 2020-12-12 ENCOUNTER — Telehealth: Payer: Self-pay

## 2020-12-12 DIAGNOSIS — E349 Endocrine disorder, unspecified: Secondary | ICD-10-CM

## 2020-12-12 MED ORDER — TESTOSTERONE 10 MG/ACT (2%) TD GEL: TRANSDERMAL | 1 refills | Status: DC

## 2020-12-12 NOTE — Telephone Encounter (Signed)
Copied from [place] [id number]. Topic: General - Other >> Dec 12, 2020  1:33 PM Leward Quan A wrote: Reason for CRM: Patient wife Freda Munro called in to inform Dr Caryn Section that she spoke to [place], [place]  Phone:  [phone]  Fax:  [phone]  and that they request an Rx for Androdrel to be sent over because since not being on the testosterone medication. Per Mrs Rinehimer patient have not had any of this medication in about 6 months. Say that he have been sluggish and just not the same. Please advise any questions please call.  Ph# [phone] or [phone]

## 2020-12-12 NOTE — Telephone Encounter (Signed)
Pt advised. Apt 01/12/2021 at 8:20.   Thanks,   -Mickel Baas

## 2020-12-12 NOTE — Telephone Encounter (Signed)
Does Mr Font need to be seen first?  Thanks,   -Mickel Baas

## 2020-12-12 NOTE — Telephone Encounter (Signed)
Have sent one month supply to [facility]. He needs to schedule an appt for follow up in 4 weeks.

## 2020-12-25 ENCOUNTER — Other Ambulatory Visit: Payer: Self-pay | Admitting: Family Medicine

## 2020-12-25 DIAGNOSIS — K219 Gastro-esophageal reflux disease without esophagitis: Secondary | ICD-10-CM

## 2020-12-25 NOTE — Telephone Encounter (Signed)
Requested medications are due for refill today.  yes  Requested medications are on the active medications list.  yes  Last refill. 11/28/2020  Future visit scheduled.   yes  Notes to clinic.  Courtesy refill already given.

## 2020-12-30 NOTE — Telephone Encounter (Addendum)
Pt has a coupon for the brand name Androgel. Pt would like you to call in another Rx for the brand name to  [place], [place]  That is where coupon is good at. Androgel Pump 10 MG/2% gel.  Pt has not picked this up, so will need to reschedule the 4 wk follow up

## 2020-12-31 ENCOUNTER — Other Ambulatory Visit: Payer: Self-pay

## 2020-12-31 DIAGNOSIS — E349 Endocrine disorder, unspecified: Secondary | ICD-10-CM

## 2020-12-31 MED ORDER — ANDROGEL PUMP 20.25 MG/ACT (1.62%) TD GEL: 4.0000 | Freq: Every day | TRANSDERMAL | 5 refills | Status: DC

## 2020-12-31 NOTE — Telephone Encounter (Signed)
Have sent prescription to pharmacy. He should call his pharmacy and make sure they know he has a coupon. His insurance will probably deny it.

## 2020-12-31 NOTE — Telephone Encounter (Signed)
Pt.'s wife reports the pharmacy needs the brand name for Androgel sent so they can use the coupon. Requests medication be sent again to pharmacy.

## 2020-12-31 NOTE — Addendum Note (Signed)
Addended by: [name] on: 12/31/2020 12:48 PM   Modules accepted: Orders

## 2020-12-31 NOTE — Telephone Encounter (Signed)
Tried calling patient. Left message to call back. OK for PEC triage to advise.  ?

## 2021-01-05 ENCOUNTER — Other Ambulatory Visit: Payer: Self-pay | Admitting: Family Medicine

## 2021-01-05 DIAGNOSIS — E781 Pure hyperglyceridemia: Secondary | ICD-10-CM

## 2021-01-08 ENCOUNTER — Other Ambulatory Visit: Payer: Self-pay | Admitting: Family Medicine

## 2021-01-12 ENCOUNTER — Ambulatory Visit: Payer: BC Managed Care – PPO | Admitting: Family Medicine

## 2021-01-19 ENCOUNTER — Other Ambulatory Visit: Payer: Self-pay | Admitting: Family Medicine

## 2021-01-19 DIAGNOSIS — K219 Gastro-esophageal reflux disease without esophagitis: Secondary | ICD-10-CM

## 2021-01-26 ENCOUNTER — Other Ambulatory Visit: Payer: Self-pay

## 2021-01-26 ENCOUNTER — Ambulatory Visit (INDEPENDENT_AMBULATORY_CARE_PROVIDER_SITE_OTHER): Payer: Medicare Other | Admitting: Dermatology

## 2021-01-26 DIAGNOSIS — Z1283 Encounter for screening for malignant neoplasm of skin: Secondary | ICD-10-CM | POA: Diagnosis not present

## 2021-01-26 DIAGNOSIS — L820 Inflamed seborrheic keratosis: Secondary | ICD-10-CM | POA: Diagnosis not present

## 2021-01-26 DIAGNOSIS — L570 Actinic keratosis: Secondary | ICD-10-CM | POA: Diagnosis not present

## 2021-01-26 DIAGNOSIS — Z872 Personal history of diseases of the skin and subcutaneous tissue: Secondary | ICD-10-CM

## 2021-01-26 DIAGNOSIS — D229 Melanocytic nevi, unspecified: Secondary | ICD-10-CM | POA: Diagnosis not present

## 2021-01-26 DIAGNOSIS — L821 Other seborrheic keratosis: Secondary | ICD-10-CM

## 2021-01-26 DIAGNOSIS — L578 Other skin changes due to chronic exposure to nonionizing radiation: Secondary | ICD-10-CM | POA: Diagnosis not present

## 2021-01-26 DIAGNOSIS — L300 Nummular dermatitis: Secondary | ICD-10-CM | POA: Diagnosis not present

## 2021-01-26 DIAGNOSIS — D1800 Hemangioma unspecified site: Secondary | ICD-10-CM

## 2021-01-26 DIAGNOSIS — L814 Other melanin hyperpigmentation: Secondary | ICD-10-CM

## 2021-01-26 DIAGNOSIS — L720 Epidermal cyst: Secondary | ICD-10-CM

## 2021-01-26 MED ORDER — TRIAMCINOLONE ACETONIDE 0.1 % EX CREA: 1.0000 "application " | TOPICAL_CREAM | CUTANEOUS | 1 refills | Status: DC

## 2021-01-26 NOTE — Progress Notes (Signed)
Follow-Up Visit   Subjective  Tyler Roberts is a 65 y.o. male who presents for the following: Upper body skin exam.  HE has a few scaly bumps that get caught and irritated.  He has a h/o Aks.  He also is itching a lot on his back.   The following portions of the chart were reviewed this encounter and updated as appropriate:       Review of Systems:  No other skin or systemic complaints except as noted in HPI or Assessment and Plan.  Objective  Well appearing patient in no apparent distress; mood and affect are within normal limits.  All skin waist up examined.  upper back Mild erythema with xerosis and excoriations upper back  Left Upper inner arm x 1, L lat ankle x 1, L med cheek x 2, Total = 4 (4) Erythematous keratotic or waxy stuck-on papule   R nasal dorsum x 1 Pink scaly macule    Assessment & Plan  Nummular dermatitis upper back  Start TMC 0.1% cr qd/bid aa itchy rash until clear then prn flares, avoid f/g/a Recommend mild soap and moisturizing cream 1-2 times daily.  Gentle skin care handout provided.    Topical steroids (such as triamcinolone, fluocinolone, fluocinonide, mometasone, clobetasol, halobetasol, betamethasone, hydrocortisone) can cause thinning and lightening of the skin if they are used for too long in the same area. Your physician has selected the right strength medicine for your problem and area affected on the body. Please use your medication only as directed by your physician to prevent side effects.    triamcinolone cream (KENALOG) 0.1 % - upper back Apply 1 application topically as directed. Qd to bid aa itchy rash on back until clear then prn fares, avoid face, groin, axilla  Inflamed seborrheic keratosis Left Upper inner arm x 1, L lat ankle x 1, L med cheek x 2, Total = 4  Destruction of lesion - Left Upper inner arm x 1, L lat ankle x 1, L med cheek x 2, Total = 4  Destruction method: cryotherapy   Informed consent: discussed and  consent obtained   Lesion destroyed using liquid nitrogen: Yes   Region frozen until ice ball extended beyond lesion: Yes   Outcome: patient tolerated procedure well with no complications   Post-procedure details: wound care instructions given   Additional details:  Prior to procedure, discussed risks of blister formation, small wound, skin dyspigmentation, or rare scar following cryotherapy. Recommend Vaseline ointment to treated areas while healing.   AK (actinic keratosis) R nasal dorsum x 1  Destruction of lesion - R nasal dorsum x 1  Destruction method: cryotherapy   Informed consent: discussed and consent obtained   Lesion destroyed using liquid nitrogen: Yes   Region frozen until ice ball extended beyond lesion: Yes   Outcome: patient tolerated procedure well with no complications   Post-procedure details: wound care instructions given   Additional details:  Prior to procedure, discussed risks of blister formation, small wound, skin dyspigmentation, or rare scar following cryotherapy. Recommend Vaseline ointment to treated areas while healing.   Lentigines - Scattered tan macules - Due to sun exposure - Benign-appearing, observe - Recommend daily broad spectrum sunscreen SPF 30+ to sun-exposed areas, reapply every 2 hours as needed. - Call for any changes  Seborrheic Keratoses - Stuck-on, waxy, tan-brown papules and/or plaques  - Benign-appearing - Discussed benign etiology and prognosis. - Observe - Call for any changes - recommend Gold bond rough and  bumpy  Melanocytic Nevi - Tan-brown and/or pink-flesh-colored symmetric macules and papules - Benign appearing on exam today - Observation - Call clinic for new or changing moles - Recommend daily use of broad spectrum spf 30+ sunscreen to sun-exposed areas.   Hemangiomas - Red papules - Discussed benign nature - Observe - Call for any changes  Actinic Damage - Chronic condition, secondary to cumulative UV/sun  exposure - diffuse scaly erythematous macules with underlying dyspigmentation - Recommend daily broad spectrum sunscreen SPF 30+ to sun-exposed areas, reapply every 2 hours as needed.  - Staying in the shade or wearing long sleeves, sun glasses (UVA+UVB protection) and wide brim hats (4-inch brim around the entire circumference of the hat) are also recommended for sun protection.  - Call for new or changing lesions.  Skin cancer screening performed today.  Milia - tiny firm white papules - type of cyst - benign - may be extracted if symptomatic - observe  - discussed extraction  Return in about 6 months (around 07/27/2021) for Hx of AKs, f/up nummular derm.  I, Othelia Pulling, RMA, am acting as scribe for Brendolyn Patty, MD .  Documentation: I have reviewed the above documentation for accuracy and completeness, and I agree with the above.  Brendolyn Patty MD

## 2021-01-26 NOTE — Patient Instructions (Addendum)
If you have any questions or concerns for your doctor, please call our main line at [phone] and press option 4 to reach your doctor's medical assistant. If no one answers, please leave a voicemail as directed and we will return your call as soon as possible. Messages left after 4 pm will be answered the following business day.   You may also send [place] a message via [place]. We typically respond to MyChart messages within 1-2 business days.  For prescription refills, please ask your pharmacy to contact our office. Our fax number is [phone].  If you have an urgent issue when the clinic is closed that cannot wait until the next business day, you can page your doctor at the number below.    Please note that while we do our best to be available for urgent issues outside of office hours, we are not available 24/7.   If you have an urgent issue and are unable to reach [place], you may choose to seek medical care at your doctor's office, retail clinic, urgent care center, or emergency room.  If you have a medical emergency, please immediately call 911 or go to the emergency department.  Pager Numbers  - Dr. Nehemiah Massed: [phone]  - Dr. Laurence Ferrari: [phone]  - Dr. Nicole Kindred: [phone]  In the event of inclement weather, please call our main line at [phone] for an update on the status of any delays or closures.  Dermatology Medication Tips: Please keep the boxes that topical medications come in in order to help keep track of the instructions about where and how to use these. Pharmacies typically print the medication instructions only on the boxes and not directly on the medication tubes.   If your medication is too expensive, please contact our office at [phone] option 4 or send [place] a message through [place].   We are unable to tell what your co-pay for medications will be in advance as this is different depending on your insurance coverage. However, we may be able to find a substitute  medication at lower cost or fill out paperwork to get insurance to cover a needed medication.   If a prior authorization is required to get your medication covered by your insurance company, please allow [place] 1-2 business days to complete this process.  Drug prices often vary depending on where the prescription is filled and some pharmacies may offer cheaper prices.  The website www.goodrx.com contains coupons for medications through different pharmacies. The prices here do not account for what the cost may be with help from insurance (it may be cheaper with your insurance), but the website can give you the price if you did not use any insurance.  - You can print the associated coupon and take it with your prescription to the pharmacy.  - You may also stop by our office during regular business hours and pick up a GoodRx coupon card.  - If you need your prescription sent electronically to a different pharmacy, notify our office through [facility] or by phone at [phone] option 4.   Recommend Gold Bond Rough and Bumpy for lower legs  Cryotherapy Aftercare  Wash gently with soap and water everyday.   Apply Vaseline and Band-Aid daily until healed.   Dry Skin Care  What causes dry skin?  Dry skin is common and results from inadequate moisture in the outer skin layers. Dry skin usually results from the excessive loss of moisture from the skin surface. This occurs due to two major  factors: Normally the skin's oil glands deposit a layer of oil on the skin's surface. This layer of oil prevents the loss of moisture from the skin. Exposure to soaps, cleaners, solvents, and disinfectants removes this oily film, allowing water to escape. Water loss from the skin increases when the humidity is low. During winter months we spend a lot of time indoors where the air is heated. Heated air has very low humidity. This also contributes to dry skin.  A tendency for dry skin may accompany such  disorders as eczema. Also, as people age, the number of functioning oil glands decreases, and the tendency toward dry skin can be a sensation of skin tightness when emerging from the shower.  How do I manage dry skin?  Humidify your environment. This can be accomplished by using a humidifier in your bedroom at night during winter months. Bathing can actually put moisture back into your skin if done right. Take the following steps while bathing to sooth dry skin: Avoid hot water, which only dries the skin and makes itching worse. Use warm water. Avoid washcloths or extensive rubbing or scrubbing. Use mild soaps like unscented Dove, Oil of Olay, Cetaphil, Basis, or CeraVe. If you take baths rather than showers, rinse off soap residue with clean water before getting out of tub. Once out of the shower/tub, pat dry gently with a soft towel. Leave your skin damp. While still damp, apply any medicated ointment/cream you were prescribed to the affected areas. After you apply your medicated ointment/cream, then apply your moisturizer to your whole body.This is the most important step in dry skin care. If this is omitted, your skin will continue to be dry. The choice of moisturizer is also very important. In general, lotion will not provider enough moisture to severely dry skin because it is water based. You should use an ointment or cream. Moisturizers should also be unscented. Good choices include Vaseline (plain petrolatum), Aquaphor, Cetaphil, CeraVe, Vanicream, DML Forte, Aveeno moisture, or Eucerin Cream. Bath oils can be helpful, but do not replace the application of moisturizer after the bath. In addition, they make the tub slippery causing an increased risk for falls. Therefore, we do not recommend their use.

## 2021-01-30 ENCOUNTER — Ambulatory Visit: Payer: BC Managed Care – PPO | Admitting: Family Medicine

## 2021-02-16 ENCOUNTER — Other Ambulatory Visit: Payer: Self-pay | Admitting: Family Medicine

## 2021-02-16 DIAGNOSIS — K219 Gastro-esophageal reflux disease without esophagitis: Secondary | ICD-10-CM

## 2021-02-20 ENCOUNTER — Ambulatory Visit: Payer: Self-pay

## 2021-02-20 NOTE — Telephone Encounter (Signed)
I called and spoke with patients wife and advised her that we didn't have any openings. I recommend that patient go to an urgent care. Patients wife states that the person she spoke with earlier today from our office told her that we would work patient in to be seen today. Since she didn't hear from [place] after 11am she decided to take her husband to another doctor who started patient on antibiotics and prednisone for symptoms.

## 2021-02-20 NOTE — Telephone Encounter (Signed)
Wife reports pt. Started coughing Sunday. Cough is getting more sever. Productive with clear mucus. Coughs so hard, pt. Will vomit. COVID 19 test negative. Has fatigue and head congestion. Asking for in office visit. History of pneumonia. Please advise wife - contact number [phone].    Answer Assessment - Initial Assessment Questions 1. ONSET: "When did the cough begin?"      Sunday 2. SEVERITY: "How bad is the cough today?"      Severe 3. SPUTUM: "Describe the color of your sputum" (none, dry cough; clear, white, yellow, green)     Clear 4. HEMOPTYSIS: "Are you coughing up any blood?" If so ask: "How much?" (flecks, streaks, tablespoons, etc.)     No 5. DIFFICULTY BREATHING: "Are you having difficulty breathing?" If Yes, ask: "How bad is it?" (e.g., mild, moderate, severe)    - MILD: No SOB at rest, mild SOB with walking, speaks normally in sentences, can lie down, no retractions, pulse < 100.    - MODERATE: SOB at rest, SOB with minimal exertion and prefers to sit, cannot lie down flat, speaks in phrases, mild retractions, audible wheezing, pulse 100-120.    - SEVERE: Very SOB at rest, speaks in single words, struggling to breathe, sitting hunched forward, retractions, pulse > 120      No 6. FEVER: "Do you have a fever?" If Yes, ask: "What is your temperature, how was it measured, and when did it start?"     No 7. CARDIAC HISTORY: "Do you have any history of heart disease?" (e.g., heart attack, congestive heart failure)      No 8. LUNG HISTORY: "Do you have any history of lung disease?"  (e.g., pulmonary embolus, asthma, emphysema)     Pneumonia  9. PE RISK FACTORS: "Do you have a history of blood clots?" (or: recent major surgery, recent prolonged travel, bedridden)     No 10. OTHER SYMPTOMS: "Do you have any other symptoms?" (e.g., runny nose, wheezing, chest pain)       Congested, poor appetite, fatigue 11. PREGNANCY: "Is there any chance you are pregnant?" "When was your last  menstrual period?"       N/a 12. TRAVEL: "Have you traveled out of the country in the last month?" (e.g., travel history, exposures)       No  Protocols used: Cough - Acute Productive-A-AH

## 2021-03-12 ENCOUNTER — Telehealth: Payer: Self-pay

## 2021-03-23 ENCOUNTER — Telehealth: Payer: Self-pay

## 2021-03-23 NOTE — Telephone Encounter (Signed)
That's fine, he can have that slot.

## 2021-03-23 NOTE — Telephone Encounter (Signed)
Patients wife reached out to office after I had left a voicemessage to cancel patients WTM visit with Dr. Ky Barban on 03/24/21. Wife stated that patient PCP is Dr. Caryn Section and states that this is second time his physical is being rescheduled. Patients wife states that she did not want patient to have a WTM CPE because he need\s labs and is requesting that a CPE be scheduled instead. Dr. Caryn Section has a open physical time slot on 04/01/21 at 9:20AM. Cannot I take this appointment time? KW

## 2021-03-23 NOTE — Telephone Encounter (Signed)
My chart message sent to patient stating his appt. Has been moved to 04/01/21 at 9:20 a.m. with Dr. Caryn Section.

## 2021-03-24 ENCOUNTER — Encounter: Payer: Medicare Other | Admitting: Family Medicine

## 2021-03-27 ENCOUNTER — Encounter: Payer: BC Managed Care – PPO | Admitting: Family Medicine

## 2021-04-01 ENCOUNTER — Other Ambulatory Visit: Payer: Self-pay

## 2021-04-01 ENCOUNTER — Ambulatory Visit (INDEPENDENT_AMBULATORY_CARE_PROVIDER_SITE_OTHER): Payer: Medicare Other | Admitting: Family Medicine

## 2021-04-01 ENCOUNTER — Encounter: Payer: Self-pay | Admitting: Family Medicine

## 2021-04-01 VITALS — BP 142/72 | HR 90 | Temp 98.7°F | Resp 16 | Ht 70.0 in | Wt 192.3 lb

## 2021-04-01 DIAGNOSIS — G4733 Obstructive sleep apnea (adult) (pediatric): Secondary | ICD-10-CM

## 2021-04-01 DIAGNOSIS — I10 Essential (primary) hypertension: Secondary | ICD-10-CM | POA: Diagnosis not present

## 2021-04-01 DIAGNOSIS — E349 Endocrine disorder, unspecified: Secondary | ICD-10-CM

## 2021-04-01 DIAGNOSIS — Z0000 Encounter for general adult medical examination without abnormal findings: Secondary | ICD-10-CM

## 2021-04-01 DIAGNOSIS — E781 Pure hyperglyceridemia: Secondary | ICD-10-CM

## 2021-04-01 DIAGNOSIS — Z23 Encounter for immunization: Secondary | ICD-10-CM

## 2021-04-01 DIAGNOSIS — Z125 Encounter for screening for malignant neoplasm of prostate: Secondary | ICD-10-CM

## 2021-04-01 DIAGNOSIS — Z1211 Encounter for screening for malignant neoplasm of colon: Secondary | ICD-10-CM

## 2021-04-01 NOTE — Patient Instructions (Signed)
Please review the attached list of medications and notify my office if there are any errors.   Please bring all of your medications to every appointment so we can make sure that our medication list is the same as yours.  Last recorded height was 5\' 10" . Weight goal for a BMI of 26 is 181.2 pounds.

## 2021-04-01 NOTE — Progress Notes (Signed)
Medicare Initial Preventative Physical Exam    Patient: Tyler Roberts, Male    DOB: 08-15-1955, 65 y.o.   MRN: [id number] Visit Date: 04/01/2021  Today's Provider: Lelon Huh, MD   Chief Complaint  Patient presents with   Medicare Wellness   Hypertension   Hyperlipidemia   Subjective    Medicare Initial Preventative Physical Exam Tyler Roberts is a 65 y.o. male who presents today for his Initial Preventative Physical Exam.   HPI Hypertension, follow-up  BP Readings from Last 3 Encounters:  04/01/21 (!) 147/82  03/05/20 (!) 152/80  02/28/19 138/82   Wt Readings from Last 3 Encounters:  04/01/21 192 lb 4.8 oz (87.2 kg)  03/05/20 199 lb (90.3 kg)  02/28/19 189 lb 6.4 oz (85.9 kg)     He was last seen for hypertension 1  year  ago.  BP at that visit was 152/80. Management since that visit includes continuing same medication.  He reports good compliance with treatment. He is not having side effects.  He is following a Regular diet. He is not exercising. He does not smoke.  Use of agents associated with hypertension: NSAIDS.   Outside blood pressures are not checked. Symptoms: No chest pain No chest pressure  No palpitations No syncope  No dyspnea No orthopnea  No paroxysmal nocturnal dyspnea No lower extremity edema   Pertinent labs: Lab Results  Component Value Date   NA 140 03/05/2020   K 4.4 03/05/2020   CREATININE 0.91 03/05/2020   GFRNONAA 89 03/05/2020   GLUCOSE 109 (H) 03/05/2020   TSH 1.78 12/15/2016     The 10-year ASCVD risk score (Arnett DK, et al., 2019) is: 20.2%   ---------------------------------------------------------------------------------------------------   Lipid/Cholesterol, Follow-up  Last lipid panel Other pertinent labs  Lab Results  Component Value Date   CHOL 201 (H) 03/05/2020   HDL 43 03/05/2020   LDLCALC 106 (H) 03/05/2020   TRIG 304 (H) 03/05/2020   CHOLHDL 4.7 03/05/2020   Lab Results  Component Value Date    ALT 35 03/05/2020   AST 29 03/05/2020   PLT 251 03/05/2020   TSH 1.78 12/15/2016     He was last seen for this 1  year  ago.  Management since that visit includes continuing same medication.  He reports good compliance with treatment. He is not having side effects.   Symptoms: No chest pain No chest pressure/discomfort  No dyspnea No lower extremity edema  No numbness or tingling of extremity No orthopnea  No palpitations No paroxysmal nocturnal dyspnea  No speech difficulty No syncope   Current diet: in general, a "healthy" diet   Current exercise:  golfing  The 10-year ASCVD risk score (Arnett DK, et al., 2019) is: 20.2%  ---------------------------------------------------------------------------------------------------   Follow up for Testicular hypofunction:  The patient was last seen for this 1  year  ago. Changes made at last visit include none.  He reports poor compliance with treatment. Patient has been out of Andro gel for the past 3-4 months since his insurance stopped covering the cost of the medication.  He feels that condition is Worse.  He definitely feels more fatigued since running out of medications.  He is not having side effects.   -----------------------------------------------------------------------------------------   Social History   Socioeconomic History   Marital status: Married    Spouse name: Not on file   Number of children: 1   Years of education: H/S   Highest education level: Not on file  Occupational History   Occupation: Radiographer, therapeutic: [employer]  Tobacco Use   Smoking status: Former    Types: Cigars    Quit date: 04/05/2017    Years since quitting: 3.9   Smokeless tobacco: Never   Tobacco comments:    occasionally smoked intermittenlty for a few years  Substance and Sexual Activity   Alcohol use: Yes    Alcohol/week: 0.0 standard drinks    Comment: Occasional   Drug use: No   Sexual  activity: Not on file  Other Topics Concern   Not on file  Social History Narrative   Not on file   Social Determinants of Health   Financial Resource Strain: Not on file  Food Insecurity: Not on file  Transportation Needs: Not on file  Physical Activity: Not on file  Stress: Not on file  Social Connections: Not on file  Intimate Partner Violence: Not on file    Past Medical History:  Diagnosis Date   Actinic keratosis    Shingles 09/26/2014     Patient Active Problem List   Diagnosis Date Noted   Erectile dysfunction 10/08/2015   LBP (low back pain) 09/26/2014   Allergic rhinitis 09/28/2009   Benign essential hypertension 09/23/2009   Fam hx-ischem heart disease 09/05/2006   Hypotestosteronism 09/05/2006   Obstructive apnea 03/03/2006   Hyperglyceridemia, pure 09/13/2005   Acid reflux 04/06/2003    Past Surgical History:  Procedure Laterality Date   [facility] [place] Right 2008   Due to a staph infection   LASIK     NOSE SURGERY  2002   TONSILLECTOMY AND ADENOIDECTOMY  2002    His family history includes Anuerysm in his mother; Healthy in his brother, sister, and sister; Heart attack in his father; Heart disease (age of onset: 10's) in his father; Ovarian cancer in his sister.   Current Outpatient Medications:    aspirin 81 MG tablet, , Disp: , Rfl:    Clindamycin Phosphate foam, Apply once a day to aa's as needed, Disp: 50 g, Rfl: 0   clobetasol (TEMOVATE) 0.05 % external solution, Apply 1-2 times daily to aa's as needed, Disp: 50 mL, Rfl: 0   esomeprazole (NEXIUM) 40 MG capsule, TAKE 1 CAPSULE BY MOUTH ONCE DAILY, Disp: 30 capsule, Rfl: 12   flunisolide (NASALIDE) 25 MCG/ACT (0.025%) SOLN, USE 1 SPRAY IN EACH NOSTRIL DAILY AS DIRECTED, Disp: 25 mL, Rfl: 2   irbesartan-hydrochlorothiazide (AVALIDE) 150-12.5 MG tablet, TAKE ONE TABLET EVERY DAY, Disp: 90 tablet, Rfl: 4   loratadine (CLARITIN) 10 MG tablet, Take 1 tablet (10 mg total) by mouth  daily., Disp: 30 tablet, Rfl: 11   lovastatin (MEVACOR) 20 MG tablet, TAKE 1 TABLET BY MOUTH DAILY, Disp: 30 tablet, Rfl: 12   meloxicam (MOBIC) 15 MG tablet, TAKE ONE TABLET BY MOUTH EVERY DAY WITH A MEAL, Disp: 90 tablet, Rfl: 0   metoprolol succinate (TOPROL-XL) 25 MG 24 hr tablet, TAKE 1/2 TABLET BY MOUTH DAILY, Disp: 90 tablet, Rfl: 1   triamcinolone cream (KENALOG) 0.1 %, Apply 1 application topically as directed. Qd to bid aa itchy rash on back until clear then prn fares, avoid face, groin, axilla, Disp: 80 g, Rfl: 1   ANDROGEL PUMP 20.25 MG/ACT (1.62%) GEL, Place 4 Pump onto the skin daily. (Patient not taking: Reported on 04/01/2021), Disp: 150 g, Rfl: 5   Testosterone 10 MG/ACT (2%) GEL, APPLY 4 PUMPS EVERY DAY AS DIRECTED (  Patient not taking: Reported on 04/01/2021), Disp: 60 g, Rfl: 1   Patient Care Team: [name], MD as PCP - General (Family Medicine) Brendolyn Patty, MD (Dermatology) Marlaine Hind, MD as Consulting Physician (Physical Medicine and Rehabilitation) [place], [facility]  Review of Systems  Constitutional:  Positive for fatigue. Negative for appetite change, chills and fever.  HENT:  Positive for congestion, sinus pressure and tinnitus. Negative for ear pain, hearing loss, nosebleeds and trouble swallowing.   Eyes:  Negative for pain and visual disturbance.  Respiratory:  Positive for cough. Negative for chest tightness and shortness of breath.   Cardiovascular:  Negative for chest pain, palpitations and leg swelling.  Gastrointestinal:  Negative for abdominal pain, blood in stool, constipation, diarrhea, nausea and vomiting.  Endocrine: Negative for polydipsia, polyphagia and polyuria.  Genitourinary:  Negative for dysuria and flank pain.  Musculoskeletal:  Positive for arthralgias and back pain. Negative for joint swelling, myalgias and neck stiffness.  Skin:  Negative for color change, rash and wound.  Allergic/Immunologic: Positive for  environmental allergies.  Neurological:  Negative for dizziness, tremors, seizures, speech difficulty, weakness, light-headedness and headaches.  Psychiatric/Behavioral:  Negative for behavioral problems, confusion, decreased concentration, dysphoric mood and sleep disturbance. The patient is nervous/anxious.   All other systems reviewed and are negative.     Objective  BP (!) 147/82 (BP Location: Right Arm, Patient Position: Sitting, Cuff Size: Large)    Pulse 90    Temp 98.7 F (37.1 C) (Oral)    Resp 16    Ht 5\' 10"  (1.778 m)    Wt 192 lb 4.8 oz (87.2 kg)    SpO2 99% Comment: room air   BMI 27.59 kg/m    General: Appearance:     Well developed, well nourished male in no acute distress  Eyes:    PERRL, conjunctiva/corneas clear, EOM's intact       Lungs:     Clear to auscultation bilaterally, respirations unlabored  Heart:    Normal heart rate. Normal rhythm. No murmurs, rubs, or gallops.    MS:   All extremities are intact.    Neurologic:   Awake, alert, oriented x 3. No apparent focal neurological defect.         Activities of Daily Living In your present state of health, do you have any difficulty performing the following activities: 04/01/2021  Hearing? N  Vision? N  Difficulty concentrating or making decisions? N  Walking or climbing stairs? N  Dressing or bathing? N  Doing errands, shopping? N  Some recent data might be hidden    Fall Risk Assessment Fall Risk  04/01/2021 03/05/2020 02/28/2019 12/15/2016  Falls in the past year? 0 0 0 No  Number falls in past yr: 0 0 0 -  Injury with Fall? 0 0 0 -  Risk for fall due to : No Fall Risks - - -  Follow up Falls evaluation completed - - -     Depression Screen PHQ 2/9 Scores 04/01/2021 03/05/2020 02/28/2019 12/22/2017  PHQ - 2 Score 0 0 0 0  PHQ- 9 Score 3 0 - 0    No flowsheet data found.  No results found for any visits on 04/01/21.  Assessment & Plan      Initial Preventative Physical Exam  Reviewed patient's  Family Medical History Reviewed and updated list of patient's medical providers Assessment of cognitive impairment was done Assessed patient's functional ability Established a written schedule for health screening services  Health Risk Assessent Completed and Reviewed  Exercise Activities and Dietary recommendations  Goals   None     Immunization History  Administered Date(s) Administered   Influenza,inj,Quad PF,6+ Mos 12/22/2017, 01/26/2019, 03/05/2020   Influenza-Unspecified 01/17/2017   PFIZER(Purple Top)SARS-COV-2 Vaccination 07/12/2019, 08/07/2019, 03/05/2020   Td 12/22/2017   Tdap 07/25/2007   Zoster Recombinat (Shingrix) 12/15/2016, 02/16/2017    Health Maintenance  Topic Date Due   Pneumonia Vaccine 18+ Years old (1 - PCV) Never done   HIV Screening  Never done   COVID-19 Vaccine (4 - Booster for Pfizer series) 04/30/2020   INFLUENZA VACCINE  11/03/2020   COLONOSCOPY (Pts 45-71yrs Insurance coverage will need to be confirmed)  12/06/2020   TETANUS/TDAP  12/23/2027   Hepatitis C Screening  Completed   Zoster Vaccines- Shingrix  Completed   HPV VACCINES  Aged Out     Discussed health benefits of physical activity, and encouraged him to engage in regular exercise appropriate for his age and condition.   1. Need for influenza vaccination  - Flu Vaccine QUAD High Dose IM (Fluad)  2. Need for vaccination against Streptococcus pneumoniae  - Pneumococcal conjugate vaccine 20-valent (PCV20)  3. Benign essential hypertension SBP mildly elevated. May be related to being off of testosterone replacement. See how labs look. Consider adding another BP medication. - EKG 12-Lead  4. Hypotestosteronism Was doing well on testosterone gel which is no longer covered by insurance and has been off of for about 3 months.  - Testosterone,Free and Total  He felt significantly better on testosterone replacement. If levels have declined then will reorder since he will be on a  different medication benefit plan in 2023.   5. Hyperglyceridemia, pure He is tolerating lovastatin well with no adverse effects.   - CBC - Comprehensive metabolic panel - Lipid panel  6. Obstructive apnea Using CPAP consistently and benefiting from its use.   7. Colon cancer screening Had multiple adenomatous polyps on colonoscopy in 2019 and was advised to repeat in three years.  - Ambulatory referral to gastroenterology for colonoscopy  8. Prostate cancer screening  - PSA Total (Reflex To Free) ([facility] only)  9. Encounter for initial preventive physical examination covered by Medicare        The entirety of the information documented in the History of Present Illness, Review of Systems and Physical Exam were personally obtained by me. Portions of this information were initially documented by the CMA and reviewed by me for thoroughness and accuracy.     Lelon Huh, MD  [facility] [phone] (phone) [phone] (fax)  [place]

## 2021-04-03 LAB — PSA TOTAL (REFLEX TO FREE): Prostate Specific Ag, Serum: 0.8 ng/mL (ref 0.0–4.0)

## 2021-04-03 LAB — COMPREHENSIVE METABOLIC PANEL
ALT: 29 IU/L (ref 0–44)
AST: 21 IU/L (ref 0–40)
Albumin/Globulin Ratio: 2.5 — ABNORMAL HIGH (ref 1.2–2.2)
Albumin: 4.9 g/dL — ABNORMAL HIGH (ref 3.8–4.8)
Alkaline Phosphatase: 66 IU/L (ref 44–121)
BUN/Creatinine Ratio: 15 (ref 10–24)
BUN: 15 mg/dL (ref 8–27)
Bilirubin Total: 1 mg/dL (ref 0.0–1.2)
CO2: 26 mmol/L (ref 20–29)
Calcium: 9.9 mg/dL (ref 8.6–10.2)
Chloride: 102 mmol/L (ref 96–106)
Creatinine, Ser: 0.97 mg/dL (ref 0.76–1.27)
Globulin, Total: 2 g/dL (ref 1.5–4.5)
Glucose: 107 mg/dL — ABNORMAL HIGH (ref 70–99)
Potassium: 4.4 mmol/L (ref 3.5–5.2)
Sodium: 142 mmol/L (ref 134–144)
Total Protein: 6.9 g/dL (ref 6.0–8.5)
eGFR: 87 mL/min/{1.73_m2} (ref >59)

## 2021-04-03 LAB — LIPID PANEL
Chol/HDL Ratio: 4.3 ratio (ref 0.0–5.0)
Cholesterol, Total: 177 mg/dL (ref 100–199)
HDL: 41 mg/dL (ref >39)
LDL Chol Calc (NIH): 98 mg/dL (ref 0–99)
Triglycerides: 223 mg/dL — ABNORMAL HIGH (ref 0–149)
VLDL Cholesterol Cal: 38 mg/dL (ref 5–40)

## 2021-04-03 LAB — CBC
Hematocrit: 44.1 % (ref 37.5–51.0)
Hemoglobin: 14.5 g/dL (ref 13.0–17.7)
MCH: 29.2 pg (ref 26.6–33.0)
MCHC: 32.9 g/dL (ref 31.5–35.7)
MCV: 89 fL (ref 79–97)
Platelets: 257 10*3/uL (ref 150–450)
RBC: 4.96 x10E6/uL (ref 4.14–5.80)
RDW: 12.7 % (ref 11.6–15.4)
WBC: 6.7 10*3/uL (ref 3.4–10.8)

## 2021-04-03 LAB — TESTOSTERONE,FREE AND TOTAL
Testosterone, Free: 4.1 pg/mL — ABNORMAL LOW (ref 6.6–18.1)
Testosterone: 127 ng/dL — ABNORMAL LOW (ref 264–916)

## 2021-04-07 ENCOUNTER — Other Ambulatory Visit: Payer: Self-pay | Admitting: Family Medicine

## 2021-04-07 DIAGNOSIS — E349 Endocrine disorder, unspecified: Secondary | ICD-10-CM

## 2021-04-07 MED ORDER — TESTOSTERONE 20.25 MG/ACT (1.62%) TD GEL: 1.0000 | Freq: Every day | TRANSDERMAL | 4 refills | Status: DC

## 2021-04-13 ENCOUNTER — Encounter: Payer: Self-pay | Admitting: Family Medicine

## 2021-04-13 DIAGNOSIS — E291 Testicular hypofunction: Secondary | ICD-10-CM | POA: Insufficient documentation

## 2021-04-15 NOTE — Telephone Encounter (Signed)
Scheduling

## 2021-04-27 ENCOUNTER — Other Ambulatory Visit: Payer: Self-pay | Admitting: Family Medicine

## 2021-07-06 LAB — HM COLONOSCOPY

## 2021-07-28 ENCOUNTER — Ambulatory Visit: Payer: Medicare PPO | Admitting: Dermatology

## 2021-07-28 DIAGNOSIS — L578 Other skin changes due to chronic exposure to nonionizing radiation: Secondary | ICD-10-CM | POA: Diagnosis not present

## 2021-07-28 DIAGNOSIS — L821 Other seborrheic keratosis: Secondary | ICD-10-CM

## 2021-07-28 DIAGNOSIS — L300 Nummular dermatitis: Secondary | ICD-10-CM | POA: Diagnosis not present

## 2021-07-28 DIAGNOSIS — L570 Actinic keratosis: Secondary | ICD-10-CM | POA: Diagnosis not present

## 2021-07-28 NOTE — Progress Notes (Signed)
? ?Follow-Up Visit ?  ?Subjective  ?Tyler Roberts is a 66 y.o. male who presents for the following: Actinic Keratosis (6 month follow up. Hx of aks at right nose. ), nummular dermatitis (Hx at upper back. Rx of tmc 1 % cream to aa's as needed. Patient reports back has improved. ), and inflamed Seborrheic keratosis (Hx of isks at Left Upper inner arm, L lat ankle, L med cheek). ? ? ?The patient has spots, moles and lesions to be evaluated, some may be new or changing and the patient has concerns that these could be cancer. ? ? ?The following portions of the chart were reviewed this encounter and updated as appropriate:   ?  ? ?Review of Systems: No other skin or systemic complaints except as noted in HPI or Assessment and Plan. ? ? ?Objective  ?Well appearing patient in no apparent distress; mood and affect are within normal limits. ? ?Rechecked areas at left upper inner arm, left lateral ankle and left medial cheek were clear today at exam. ? ?A focused examination was performed including back, face, arms, left ankle, left med cheek. Relevant physical exam findings are noted in the Assessment and Plan. ? ?upper back ?Clear today with mild xerosis  ? ?right nasal dorsum x 1 ?Improved with small residual scale at right nasal dorsum  ? ? ?Assessment & Plan  ?Nummular dermatitis ?upper back ? ?Chronic condition with duration or expected duration over one year. Currently well-controlled.  ?  ?Continue as needed TMC 0.1% cr qd/bid aa itchy rash until clear then prn flares, avoid f/g/a ?Recommend mild soap and moisturizing cream 1-2 times daily. Gentle skin care handout provided.   ?  ?Topical steroids (such as triamcinolone, fluocinolone, fluocinonide, mometasone, clobetasol, halobetasol, betamethasone, hydrocortisone) can cause thinning and lightening of the skin if they are used for too long in the same area. Your physician has selected the right strength medicine for your problem and area affected on the body. Please  use your medication only as directed by your physician to prevent side effects.   ?  ? ?Related Medications ?triamcinolone cream (KENALOG) 0.1 % ?Apply 1 application topically as directed. Qd to bid aa itchy rash on back until clear then prn fares, avoid face, groin, axilla ? ?Actinic keratosis ?right nasal dorsum x 1 ? ?Actinic keratoses are precancerous spots that appear secondary to cumulative UV radiation exposure/sun exposure over time. They are chronic with expected duration over 1 year. A portion of actinic keratoses will progress to squamous cell carcinoma of the skin. It is not possible to reliably predict which spots will progress to skin cancer and so treatment is recommended to prevent development of skin cancer. ? ?Recommend daily broad spectrum sunscreen SPF 30+ to sun-exposed areas, reapply every 2 hours as needed.  ?Recommend staying in the shade or wearing long sleeves, sun glasses (UVA+UVB protection) and wide brim hats (4-inch brim around the entire circumference of the hat). ?Call for new or changing lesions. ? ?Destruction of lesion - right nasal dorsum x 1 ? ?Destruction method: cryotherapy   ?Informed consent: discussed and consent obtained   ?Lesion destroyed using liquid nitrogen: Yes   ?Region frozen until ice ball extended beyond lesion: Yes   ?Outcome: patient tolerated procedure well with no complications   ?Post-procedure details: wound care instructions given   ?Additional details:  Prior to procedure, discussed risks of blister formation, small wound, skin dyspigmentation, or rare scar following cryotherapy. Recommend Vaseline ointment to treated areas while healing. ? ? ? ?  Seborrheic Keratoses ?- Stuck-on, waxy, tan-brown papules and/or plaques  ?- Benign-appearing ?- Discussed benign etiology and prognosis. ?- Observe ?- Call for any changes ? ?Actinic Damage ?- chronic, secondary to cumulative UV radiation exposure/sun exposure over time ?- diffuse scaly erythematous macules with  underlying dyspigmentation ?- Recommend daily broad spectrum sunscreen SPF 30+ to sun-exposed areas, reapply every 2 hours as needed.  ?- Recommend staying in the shade or wearing long sleeves, sun glasses (UVA+UVB protection) and wide brim hats (4-inch brim around the entire circumference of the hat). ?- Call for new or changing lesions. ? ?Return for ubse in October . ?I, Ruthell Rummage, CMA, am acting as scribe for Brendolyn Patty, MD. ? ?Documentation: I have reviewed the above documentation for accuracy and completeness, and I agree with the above. ? ?Brendolyn Patty MD  ? ?

## 2021-07-28 NOTE — Patient Instructions (Addendum)
Actinic keratoses are precancerous spots that appear secondary to cumulative UV radiation exposure/sun exposure over time. They are chronic with expected duration over 1 year. A portion of actinic keratoses will progress to squamous cell carcinoma of the skin. It is not possible to reliably predict which spots will progress to skin cancer and so treatment is recommended to prevent development of skin cancer. ? ?Recommend daily broad spectrum sunscreen SPF 30+ to sun-exposed areas, reapply every 2 hours as needed.  ?Recommend staying in the shade or wearing long sleeves, sun glasses (UVA+UVB protection) and wide brim hats (4-inch brim around the entire circumference of the hat). ?Call for new or changing lesions.  ? ?Cryotherapy Aftercare ? ?Wash gently with soap and water everyday.   ?Apply Vaseline and Band-Aid daily until healed.  ? ? ? ?Gentle Skin Care Guide ? ?1. Bathe no more than once a day. ? ?2. Avoid bathing in hot water ? ?3. Use a mild soap like Dove, Vanicream, Cetaphil, CeraVe. Can use Lever 2000 or Cetaphil antibacterial soap ? ?4. Use soap only where you need it. On most days, use it under your arms, between your legs, and on your feet. Let the water rinse other areas unless visibly dirty. ? ?5. When you get out of the bath/shower, use a towel to gently blot your skin dry, don't rub it. ? ?6. While your skin is still a little damp, apply a moisturizing cream such as Vanicream, CeraVe, Cetaphil, Eucerin, Sarna lotion or plain Vaseline Jelly. For hands apply Neutrogena [place] Hand Cream or Excipial Hand Cream. ? ?7. Reapply moisturizer any time you start to itch or feel dry. ? ?8. Sometimes using free and clear laundry detergents can be helpful. Fabric softener sheets should be avoided. Downy Free & Gentle liquid, or any liquid fabric softener that is free of dyes and perfumes, it acceptable to use ? ?9. If your doctor has given you prescription creams you may apply moisturizers over them   ? ? ? ? ?Topical steroids (such as triamcinolone, fluocinolone, fluocinonide, mometasone, clobetasol, halobetasol, betamethasone, hydrocortisone) can cause thinning and lightening of the skin if they are used for too long in the same area. Your physician has selected the right strength medicine for your problem and area affected on the body. Please use your medication only as directed by your physician to prevent side effects.  ? ?If You Need Anything After Your Visit ? ?If you have any questions or concerns for your doctor, please call our main line at [phone] and press option 4 to reach your doctor's medical assistant. If no one answers, please leave a voicemail as directed and we will return your call as soon as possible. Messages left after 4 pm will be answered the following business day.  ? ?You may also send [place] a message via MyChart. We typically respond to MyChart messages within 1-2 business days. ? ?For prescription refills, please ask your pharmacy to contact our office. Our fax number is [phone]. ? ?If you have an urgent issue when the clinic is closed that cannot wait until the next business day, you can page your doctor at the number below.   ? ?Please note that while we do our best to be available for urgent issues outside of office hours, we are not available 24/7.  ? ?If you have an urgent issue and are unable to reach [place], you may choose to seek medical care at your doctor's office, retail clinic, urgent care center, or emergency room. ? ?  If you have a medical emergency, please immediately call 911 or go to the emergency department. ? ?Pager Numbers ? ?- Dr. Nehemiah Massed: [phone] ? ?- Dr. Laurence Ferrari: [phone] ? ?- Dr. Nicole Kindred: [phone] ? ?In the event of inclement weather, please call our main line at [phone] for an update on the status of any delays or closures. ? ?Dermatology Medication Tips: ?Please keep the boxes that topical medications come in in order to help keep track of  the instructions about where and how to use these. Pharmacies typically print the medication instructions only on the boxes and not directly on the medication tubes.  ? ?If your medication is too expensive, please contact our office at [phone] option 4 or send [place] a message through [place].  ? ?We are unable to tell what your co-pay for medications will be in advance as this is different depending on your insurance coverage. However, we may be able to find a substitute medication at lower cost or fill out paperwork to get insurance to cover a needed medication.  ? ?If a prior authorization is required to get your medication covered by your insurance company, please allow [place] 1-2 business days to complete this process. ? ?Drug prices often vary depending on where the prescription is filled and some pharmacies may offer cheaper prices. ? ?The website www.goodrx.com contains coupons for medications through different pharmacies. The prices here do not account for what the cost may be with help from insurance (it may be cheaper with your insurance), but the website can give you the price if you did not use any insurance.  ?- You can print the associated coupon and take it with your prescription to the pharmacy.  ?- You may also stop by our office during regular business hours and pick up a GoodRx coupon card.  ?- If you need your prescription sent electronically to a different pharmacy, notify our office through [facility] or by phone at [phone] option 4. ? ? ? ? ?Si Usted Necesita Algo Despu?s de Su Visita ? ?Tambi?n puede enviarnos un mensaje a trav?s de MyChart. Por lo general respondemos a los mensajes de MyChart en el transcurso de 1 a 2 d?as h?biles. ? ?Para renovar recetas, por favor pida a su farmacia que se ponga en contacto con nuestra oficina. Nuestro n?mero de fax es el [phone]. ? ?Si tiene un asunto urgente cuando la cl?nica est? cerrada y que no puede esperar hasta el siguiente d?a  h?bil, puede llamar/localizar a su doctor(a) al n?mero que aparece a continuaci?n.  ? ?Por favor, tenga en cuenta que aunque hacemos todo lo posible para estar disponibles para asuntos urgentes fuera del horario de oficina, no estamos disponibles las 24 horas del d?a, los 7 d?as de la semana.  ? ?Si tiene un problema urgente y no puede comunicarse con nosotros, puede optar por buscar atenci?n m?dica  en el consultorio de su doctor(a), en una cl?nica privada, en un centro de atenci?n urgente o en una sala de emergencias. ? ?Si tiene Engineer, maintenance (IT) m?dica, por favor llame inmediatamente al 911 o vaya a la sala de emergencias. ? ?N?meros de b?per ? ?- Dr. Nehemiah Massed: [phone] ? ?- Dra. Moye: [phone] ? ?- Dra. Nicole Kindred: [phone] ? ?En caso de inclemencias del tiempo, por favor llame a nuestra l?nea principal al [phone] para una actualizaci?n sobre el estado de cualquier retraso o cierre. ? ?Consejos para la medicaci?n en dermatolog?a: ?Por favor, guarde las cajas en las que vienen los medicamentos de  uso t?pico para ayudarle a seguir las instrucciones sobre d?nde y c?mo usarlos. Las farmacias generalmente imprimen las instrucciones del medicamento s?lo en las cajas y no directamente en los tubos del [place].  ? ?Si su medicamento es muy caro, por favor, p?ngase en contacto con Zigmund Daniel llamando al [phone] y presione la opci?n 4 o env?enos un mensaje a trav?s de MyChart.  ? ?No podemos decirle cu?l ser? su copago por los medicamentos por adelantado ya que esto es diferente dependiendo de la cobertura de su seguro. Sin embargo, es posible que podamos encontrar un medicamento sustituto a Electrical engineer un formulario para que el seguro cubra el medicamento que se considera necesario.  ? ?Si se requiere Ardelia Mems autorizaci?n previa para que su compa??a de seguros [place] su medicamento, por favor perm?tanos de 1 a 2 d?as h?biles para completar este proceso. ? ?Los precios de los medicamentos  var?an con frecuencia dependiendo del Environmental consultant de d?nde se surte la receta y alguna farmacias pueden ofrecer precios m?s baratos. ? ?El sitio web www.goodrx.com tiene cupones para medicamentos de Airline pilot. L

## 2021-08-26 ENCOUNTER — Other Ambulatory Visit: Payer: Self-pay | Admitting: Family Medicine

## 2021-10-10 ENCOUNTER — Other Ambulatory Visit: Payer: Self-pay | Admitting: Family Medicine

## 2021-10-10 DIAGNOSIS — E349 Endocrine disorder, unspecified: Secondary | ICD-10-CM

## 2021-10-12 NOTE — Telephone Encounter (Signed)
Requested medication (s) are due for refill today: Yes  Requested medication (s) are on the active medication list: Yes  Last refill:  04/07/21  Future visit scheduled: No  Notes to clinic:  Not assigned a protocol.    Requested Prescriptions  Pending Prescriptions Disp Refills   Testosterone 1.62 % GEL [Pharmacy Med Name: TESTOSTERONE 1.62% GEL GM] 150 g     Sig: PLACE ONE (1) PUMP ONTO THE SKIN DAILY AS DIRECTED     Off-Protocol Failed - 10/10/2021 11:43 AM      Failed - Medication not assigned to a protocol, review manually.      Failed - Valid encounter within last 12 months    Recent Outpatient Visits           6 months ago Encounter for initial preventive physical examination covered by [facility] [name], MD   1 year ago Annual physical exam   [facility] [name], MD   2 years ago Annual physical exam   [facility] [name], MD   2 years ago [place] Post, [place]   3 years ago Annual physical exam   [facility] [name], MD       Future Appointments             In 3 months Brendolyn Patty, MD [place]

## 2021-11-27 ENCOUNTER — Other Ambulatory Visit: Payer: Self-pay | Admitting: Family Medicine

## 2021-11-27 DIAGNOSIS — I10 Essential (primary) hypertension: Secondary | ICD-10-CM

## 2021-12-21 ENCOUNTER — Ambulatory Visit: Payer: Medicare PPO | Attending: Neurological Surgery | Admitting: Physical Therapy

## 2021-12-21 ENCOUNTER — Encounter: Payer: Self-pay | Admitting: Physical Therapy

## 2021-12-21 DIAGNOSIS — M25551 Pain in right hip: Secondary | ICD-10-CM | POA: Diagnosis present

## 2021-12-21 DIAGNOSIS — M6281 Muscle weakness (generalized): Secondary | ICD-10-CM | POA: Diagnosis present

## 2021-12-21 DIAGNOSIS — M5459 Other low back pain: Secondary | ICD-10-CM | POA: Diagnosis present

## 2021-12-21 NOTE — Therapy (Signed)
OUTPATIENT PHYSICAL THERAPY THORACOLUMBAR EVALUATION   Patient Name: Tyler Roberts MRN: [id number] DOB:04/30/1955, 66 y.o., male Today's Date: 12/21/2021   PT End of Session - 12/21/21 1057     Visit Number 1    Number of Visits 13    Date for PT Re-Evaluation 02/01/22    Authorization Type [employer] 2023 - VL based on authorization    Authorization Time Period initial eval 12/21/21    Progress Note Due on Visit 10    PT Start Time 0934    PT Stop Time 1018    PT Time Calculation (min) 44 min    Activity Tolerance No increased pain;Patient tolerated treatment well    Behavior During Therapy [facility] for tasks assessed/performed             Past Medical History:  Diagnosis Date   Actinic keratosis    Shingles 09/26/2014   Past Surgical History:  Procedure Laterality Date   HERNIA REPAIR  1990   KNEE SURGERY Right 2008   Due to a staph infection   LASIK     NOSE SURGERY  2002   TONSILLECTOMY AND ADENOIDECTOMY  2002   Patient Active Problem List   Diagnosis Date Noted   Hypogonadism male 04/13/2021   Erectile dysfunction 10/08/2015   LBP (low back pain) 09/26/2014   Allergic rhinitis 09/28/2009   Benign essential hypertension 09/23/2009   Fam hx-ischem heart disease 09/05/2006   Hypotestosteronism 09/05/2006   Obstructive apnea 03/03/2006   Hyperglyceridemia, pure 09/13/2005   Acid reflux 04/06/2003    PCP: Tyler Sons, MD  REFERRING PROVIDER: [name], MD  REFERRING DIAGNOSIS: M54.16 (ICD-10-CM) - Radiculopathy, lumbar region  THERAPY DIAG: Other low back pain  Pain in right hip  Muscle weakness (generalized)  RATIONALE FOR EVALUATION AND TREATMENT: Rehabilitation  ONSET DATE: episodic pain last 5 years, most recent flare-up around 09/03/2021  FOLLOW UP APPT WITH PROVIDER: Yes , follow up with MD in November    SUBJECTIVE:                                                                                                                                                                                          Chief Complaint: Patient is a 66 year old male with primary complaint of low back pain, referred for lumbar radiculopathy.   Pertinent History Patient is a 66 year old male with primary complaint of low back pain, referred for lumbar radiculopathy. Pt reports over 5 years of back problems. He states he was going to orthopedist - pt was referred to Dr. Ronnald Roberts (neurosurgeon) in [place], [place]. Pt has been getting steroid injections over last few  years and this has helped "off and on." He reports having one in June that didn't help; he is having another one 12/22/21 at different spinal level. Most recent MRI showing ongoing bulging discs and degenerative changes.  Pt reports pain with playing golf. He gets stabbing pain in back with bending over. MD informed pt that if he gets surgery, it will include hardware/fusion. Pt reports difficulty getting up in AM usually. Pt reports intermittent disturbed sleep due to back hurting - pt reports this can get better with either lying flat on back or lying on side with knees to chest. Patient reports no sudden weight loss. No bowel/bladder changes. No personal cardiac Hx (pt does have 1st-degree family Hx). No history of cancers. Symptoms are variable. Pt reports not having pain when on the move and walking. Pt was informed of mild levoscoliosis per recent imaging.   Pain:  Pain Intensity: Present: 1/10, Best: /10, Worst: 10/10 Pain location: Low back, R hip, R flank; intermittently to R groin  Pain Quality: stabbing  Radiating: Yes , to R hip/groin  Numbness/Tingling: Yes, in hands, none in lower extremities  Focal Weakness: yes, losing strength in his hands Aggravating factors: symptoms are worst with bending over, weed eating positioning, swinging golf club, prolonged standing in one spot Relieving factors: propping legs up, pillow on his low back, lying down in bed  24-hour pain behavior: worse in  AM History of prior back injury, pain, surgery, or therapy: Yes, episodic back pain over last 5 years  Falls: Has patient fallen in last 6 months? No, Number of falls: N/A Follow-up appointment with MD: Yes, in November Imaging: Yes , MRI about 2 weeks ago; pt was informed that there was no major changes on imaging versus previous scans  MRI of lumbar spine from 06/2016 1. Central/right subarticular disc extrusion with inferior migration at L2-3 with resultant moderate right subarticular stenosis. 2. Moderate right lateral recess stenosis at L3-4 related to degenerative disc bulge and facet disease. 3. Left eccentric disc bulging with facet disease at L4-5, resulting in moderate left lateral recess and foraminal stenosis. 4. Small left foraminal disc protrusion at L5-S1, closely approximating the exiting left L5 nerve root. 5. Additional more mild multilevel degenerative spondylolysis as above. Please see above report for a full description of these findings.  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; yard work,, washing cars [employer] (bowel/bladder changes, saddle paresthesia, personal history of cancer, h/o spinal tumors, h/o compression fx, h/o abdominal aneurysm, abdominal pain, chills/fever, night sweats, nausea, vomiting, unrelenting pain, first onset of insidious LBP <20 y/o): Negative  Precautions: None  Weight Bearing Restrictions: No  Living Environment Lives with: lives with their family and lives with their spouse, both are retired Lives in: House/apartment   Patient Goals: Strengthen back    OBJECTIVE:   Patient Tyler Roberts 56, predicted outcome score of 81   Cognition Patient is oriented to person, place, and time.  Recent memory is intact.  Remote memory is intact.  Attention span and concentration are intact.  Expressive speech is intact.  Patient's fund of knowledge is within normal  limits for educational level.     Gross Musculoskeletal Assessment Tremor: None Bulk: Normal Tone: Normal No visible step-off along spinal column, no apparent rib flare or gross asymmetry in standing   GAIT: Mild dynamic varus in L knee with loading response   Posture: Lumbar lordosis: WNL Lumbar lateral shift: Negative Lower crossed  syndrome (tight hip flexors and erector spinae; weak gluts and abs): Negative   AROM  AROM (Normal range in degrees) AROM  12/21/2021  Lumbar   Flexion (65) 100%  Extension (30) 100%*  Right lateral flexion (25) 75%*  Left lateral flexion (25) 100%*  Right rotation (30) 100%*  Left rotation (30) 75%      Hip Right Left  Flexion (125) WNL WNL  Extension (15)    Abduction (40)    Adduction     Internal Rotation (45)    External Rotation (45) WNL WNL      (* = pain; Blank rows = not tested)   LE MMT:  MMT (out of 5) Right 12/21/2021 Left 12/21/2021  Hip flexion 5 5  Hip extension 4+ 4+  Hip abduction    Hip adduction    Hip internal rotation    Hip external rotation    Knee flexion 5 5  Knee extension 5 5  Ankle dorsiflexion 5 5  Ankle plantarflexion    Ankle inversion    Ankle eversion    (* = pain; Blank rows = not tested)   Sensation Grossly intact to light touch bilateral LEs as determined by testing dermatomes L2-S2, mild dec light touch sensation R great toe    Reflexes R/L Knee Jerk (L3/4): 2+/2+  Ankle Jerk (S1/2): 2+/1+  Clonus: R Negative, L Negative Homan's Sign: R Negative, L Negative   Muscle Length Hamstrings: R: Negative L: Negative Ely (quadriceps): R: Positive L: Positive Thomas (hip flexors): R: Not examined L: Not examined    Palpation  Location LEFT  RIGHT           Lumbar paraspinals 0 0  Quadratus Lumborum 0 0  Gluteus Maximus  1  Gluteus Medius  1  Deep hip external rotators  0  PSIS 0 0  Fortin's Area (SIJ)    Greater Trochanter  0  (Blank rows = not tested) Graded on 0-4 scale  (0 = no pain, 1 = pain, 2 = pain with wincing/grimacing/flinching, 3 = pain with withdrawal, 4 = unwilling to allow palpation), (Blank rows = not tested)   Passive Accessory Intervertebral Motion Pt denies reproduction of back pain with CPA L1-L5 and UPA bilaterally L1-L5. Generally, hypomobile along L3-S1 and along T7-T12 with CPA    SPECIAL TESTS Lumbar Radiculopathy and Discogenic: Centralization and Peripheralization (SN 92, -LR 0.12): Mild axial pain transiently with repeated extension, no apparent peripheralization/centralization Slump (SN 83, -LR 0.32): R: Negative L: Negative SLR (SN 92, -LR 0.29): R: Positive L:  Negative Crossed SLR (SP 90): R: Negative L: Negative  Facet Joint: Extension-Rotation (SN 100, -LR 0.0): R: Positive L: Negative  Lumbar Foraminal Stenosis: Lumbar quadrant (SN 70): R: Positive for mild R flank pain L: Negative  Hip: FABER (SN 81): R: Positive for pain in lateral hip L: Negative  Piriformis Syndrome: PACE Sign: Negative    TODAY'S TREATMENT   Therapeutic Exercise - for HEP establishment, discussion on appropriate exercise/activity modification, PT education   Reviewed baseline home exercises and provided handout for MedBridge program (see Access Code); tactile cueing and therapist demonstration utilized as needed for carryover of proper technique to HEP.     Patient education on current condition, role of PT and repeated movement program, prognosis, plan of care. Discussion on activity modification to prevent flare-up of condition, including improved sitting posture and avoidance of stopped/rounded posture temporarily.     PATIENT EDUCATION:  Education details: see above for patient education  details Person educated: Patient Education method: Explanation, Demonstration, and Handouts Education comprehension: verbalized understanding and returned demonstration   HOME EXERCISE PROGRAM: Access Code: [email or web address] URL:  [email or web address] Date: 12/21/2021 Prepared by: Valentina Gu  Exercises - Standing Lumbar Extension with Counter  - 5-6 x daily - 7 x weekly - 1 sets - 10 reps - 1sec hold - Sidelying Thoracic Rotation with Open Book  - 2 x daily - 7 x weekly - 2 sets - 10 reps - 2sec hold   ASSESSMENT:  CLINICAL IMPRESSION: Patient is a 66 y.o. active male who was seen today for physical therapy evaluation and treatment for back pain with hx of multiple steroid injections, no back surgery to date. Pt does have moderate referral to R flank and R hip - no numbness/tingling or significant myotome deficits noted today; pt does have possible mild sensory change in L5 dermatome. 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 Objective impairments include decreased mobility, decreased ROM, decreased strength, hypomobility, impaired flexibility, and pain. These impairments are limiting patient from shopping, community activity, yard work, and Marketing executive . Personal factors including Age, Past/current experiences, Time since onset of injury/illness/exacerbation, and 2 comorbidities: (HTN, hyperglyceridemia) are also affecting patient's functional outcome. Patient will benefit from skilled PT to address above impairments and improve overall function.  REHAB POTENTIAL: Good  CLINICAL DECISION MAKING: Evolving/moderate complexity  EVALUATION COMPLEXITY: Moderate   GOALS:  SHORT TERM GOALS: Target date: 01/04/2022  Pt will be independent with HEP to improve strength and decrease back pain to improve pain-free function at home and work. Baseline: 12/21/21: Baseline HEP initiated Goal status: INITIAL   LONG TERM GOALS: Target date: 02/01/2022  Pt will increase FOTO to at least 63 to demonstrate significant improvement in function at home and work related to back pain  Baseline:  12/21/21: 56 Goal status:  INITIAL  2.  Pt will decrease worst back pain by at least 2 points on the NPRS in order to demonstrate clinically significant reduction in back pain. Baseline: 12/21/21: 10/10 at worst.  Goal status: INITIAL  3.  Patient will have full thoracolumbar AROM without reproduction of symptoms as needed for self-care ADLs, reaching, bending to retrieve low-lying items, household chores    Baseline: 12/21/21: Decreased motion with R lateral flexion and L rotation; pain reproduction with extension, bilateral lateral flexion, and R rotation.  Goal status: INITIAL  4.  Patient will complete shopping outing with his wife without increase in back pain > 1-2/10 as needed for participation in community activity with his spouse Baseline: 12/21/21: Pain with prolonged standing and community outings with spouse  Goal status: INITIAL  5.  Patient will perform golf swing with full backswing and follow-through with sound power production and no increase in R flank pain or hip pain as needed for participation in golfing recreationally with friends Baseline: 12/21/21: Intermittent significant pain with golf swing Goal status: INITIAL    PLAN: PT FREQUENCY: 2x/week  PT DURATION: 6 weeks  PLANNED INTERVENTIONS: Therapeutic exercises, Therapeutic activity, Patient/Family education, Joint manipulation, Joint mobilization, Dry Needling, Electrical stimulation, Spinal mobilization, Cryotherapy, Moist heat, Traction, and Manual therapy  PLAN FOR NEXT SESSION: Follow up on response to repeated movement program, f/u on response with lumbar spine injections on 12/22/21. Continue with graded thoracolumbar mobility, hip/spine extensor and posterior chain strengthening.    Valentina Gu, PT, DPT #[id number]  Eilleen Kempf 12/21/2021, 11:05 AM

## 2021-12-23 ENCOUNTER — Encounter: Payer: Medicare PPO | Admitting: Physical Therapy

## 2021-12-28 ENCOUNTER — Ambulatory Visit: Payer: Medicare PPO

## 2021-12-28 ENCOUNTER — Encounter: Payer: Self-pay | Admitting: Physical Therapy

## 2021-12-28 DIAGNOSIS — M5459 Other low back pain: Secondary | ICD-10-CM | POA: Diagnosis not present

## 2021-12-28 DIAGNOSIS — M25551 Pain in right hip: Secondary | ICD-10-CM

## 2021-12-28 DIAGNOSIS — M6281 Muscle weakness (generalized): Secondary | ICD-10-CM

## 2021-12-28 NOTE — Therapy (Signed)
OUTPATIENT PHYSICAL THERAPY THORACOLUMBAR TREATMENT   Patient Name: Tyler Roberts MRN: [id number] DOB:Jul 17, 1955, 66 y.o., male Today's Date: 12/28/2021   PT End of Session - 12/28/21 0933     Visit Number 2    Number of Visits 13    Date for PT Re-Evaluation 02/01/22    Authorization Type [employer] 2023 - VL based on authorization    Authorization Time Period initial eval 12/21/21    Progress Note Due on Visit 10    PT Start Time 0931    PT Stop Time 1014    PT Time Calculation (min) 43 min    Activity Tolerance No increased pain;Patient tolerated treatment well    Behavior During Therapy [name] for tasks assessed/performed             Past Medical History:  Diagnosis Date   Actinic keratosis    Shingles 09/26/2014   Past Surgical History:  Procedure Laterality Date   HERNIA REPAIR  1990   KNEE SURGERY Right 2008   Due to a staph infection   LASIK     NOSE SURGERY  2002   TONSILLECTOMY AND ADENOIDECTOMY  2002   Patient Active Problem List   Diagnosis Date Noted   Hypogonadism male 04/13/2021   Erectile dysfunction 10/08/2015   LBP (low back pain) 09/26/2014   Allergic rhinitis 09/28/2009   Benign essential hypertension 09/23/2009   Fam hx-ischem heart disease 09/05/2006   Hypotestosteronism 09/05/2006   Obstructive apnea 03/03/2006   Hyperglyceridemia, pure 09/13/2005   Acid reflux 04/06/2003    PCP: [name], MD  REFERRING PROVIDER: [name], MD  REFERRING DIAGNOSIS: M54.16 (ICD-10-CM) - Radiculopathy, lumbar region  THERAPY DIAG: Other low back pain  Pain in right hip  Muscle weakness (generalized)  RATIONALE FOR EVALUATION AND TREATMENT: Rehabilitation  ONSET DATE: episodic pain last 5 years, most recent flare-up around 09/03/2021  FOLLOW UP APPT WITH PROVIDER: Yes , follow up with MD in November    SUBJECTIVE:                                                                                                                                                                                          Chief Complaint: Patient is a 66 year old male with primary complaint of low back pain, referred for lumbar radiculopathy.   Pertinent History Patient is a 66 year old male with primary complaint of low back pain, referred for lumbar radiculopathy. Pt reports over 5 years of back problems. He states he was going to orthopedist - pt was referred to Dr. Ronnald Ramp (neurosurgeon) in [place], [place]. Pt has been getting steroid injections over last few  years and this has helped "off and on." He reports having one in June that didn't help; he is having another one 12/22/21 at different spinal level. Most recent MRI showing ongoing bulging discs and degenerative changes.  Pt reports pain with playing golf. He gets stabbing pain in back with bending over. MD informed pt that if he gets surgery, it will include hardware/fusion. Pt reports difficulty getting up in AM usually. Pt reports intermittent disturbed sleep due to back hurting - pt reports this can get better with either lying flat on back or lying on side with knees to chest. Patient reports no sudden weight loss. No bowel/bladder changes. No personal cardiac Hx (pt does have 1st-degree family Hx). No history of cancers. Symptoms are variable. Pt reports not having pain when on the move and walking. Pt was informed of mild levoscoliosis per recent imaging.   Pain:  Pain Intensity: Present: 1/10, Best: /10, Worst: 10/10 Pain location: Low back, R hip, R flank; intermittently to R groin  Pain Quality: stabbing  Radiating: Yes , to R hip/groin  Numbness/Tingling: Yes, in hands, none in lower extremities  Focal Weakness: yes, losing strength in his hands Aggravating factors: symptoms are worst with bending over, weed eating positioning, swinging golf club, prolonged standing in one spot Relieving factors: propping legs up, pillow on his low back, lying down in bed  24-hour pain behavior: worse in  AM History of prior back injury, pain, surgery, or therapy: Yes, episodic back pain over last 5 years  Falls: Has patient fallen in last 6 months? No, Number of falls: N/A Follow-up appointment with MD: Yes, in November Imaging: Yes , MRI about 2 weeks ago; pt was informed that there was no major changes on imaging versus previous scans  MRI of lumbar spine from 06/2016 1. Central/right subarticular disc extrusion with inferior migration at L2-3 with resultant moderate right subarticular stenosis. 2. Moderate right lateral recess stenosis at L3-4 related to degenerative disc bulge and facet disease. 3. Left eccentric disc bulging with facet disease at L4-5, resulting in moderate left lateral recess and foraminal stenosis. 4. Small left foraminal disc protrusion at L5-S1, closely approximating the exiting left L5 nerve root. 5. Additional more mild multilevel degenerative spondylolysis as above. Please see above report for a full description of these findings.  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; yard work,, washing cars [employer] (bowel/bladder changes, saddle paresthesia, personal history of cancer, h/o spinal tumors, h/o compression fx, h/o abdominal aneurysm, abdominal pain, chills/fever, night sweats, nausea, vomiting, unrelenting pain, first onset of insidious LBP <20 y/o): Negative  Precautions: None  Weight Bearing Restrictions: No  Living Environment Lives with: lives with their family and lives with their spouse, both are retired Lives in: House/apartment   Patient Goals: Strengthen back    OBJECTIVE:   Patient [name] 56, predicted outcome score of 76   Cognition Patient is oriented to person, place, and time.  Recent memory is intact.  Remote memory is intact.  Attention span and concentration are intact.  Expressive speech is intact.  Patient's fund of knowledge is within normal  limits for educational level.     Gross Musculoskeletal Assessment Tremor: None Bulk: Normal Tone: Normal No visible step-off along spinal column, no apparent rib flare or gross asymmetry in standing   GAIT: Mild dynamic varus in L knee with loading response   Posture: Lumbar lordosis: WNL Lumbar lateral shift: Negative Lower crossed  syndrome (tight hip flexors and erector spinae; weak gluts and abs): Negative   AROM  AROM (Normal range in degrees) AROM  12/28/2021  Lumbar   Flexion (65) 100%  Extension (30) 100%*  Right lateral flexion (25) 75%*  Left lateral flexion (25) 100%*  Right rotation (30) 100%*  Left rotation (30) 75%      Hip Right Left  Flexion (125) WNL WNL  Extension (15)    Abduction (40)    Adduction     Internal Rotation (45)    External Rotation (45) WNL WNL      (* = pain; Blank rows = not tested)   LE MMT:  MMT (out of 5) Right 12/28/2021 Left 12/28/2021  Hip flexion 5 5  Hip extension 4+ 4+  Hip abduction    Hip adduction    Hip internal rotation    Hip external rotation    Knee flexion 5 5  Knee extension 5 5  Ankle dorsiflexion 5 5  Ankle plantarflexion    Ankle inversion    Ankle eversion    (* = pain; Blank rows = not tested)   Sensation Grossly intact to light touch bilateral LEs as determined by testing dermatomes L2-S2, mild dec light touch sensation R great toe    Reflexes R/L Knee Jerk (L3/4): 2+/2+  Ankle Jerk (S1/2): 2+/1+  Clonus: R Negative, L Negative Homan's Sign: R Negative, L Negative   Muscle Length Hamstrings: R: Negative L: Negative Ely (quadriceps): R: Positive L: Positive Thomas (hip flexors): R: Not examined L: Not examined    Palpation  Location LEFT  RIGHT           Lumbar paraspinals 0 0  Quadratus Lumborum 0 0  Gluteus Maximus  1  Gluteus Medius  1  Deep hip external rotators  0  PSIS 0 0  Fortin's Area (SIJ)    Greater Trochanter  0  (Blank rows = not tested) Graded on 0-4 scale  (0 = no pain, 1 = pain, 2 = pain with wincing/grimacing/flinching, 3 = pain with withdrawal, 4 = unwilling to allow palpation), (Blank rows = not tested)   Passive Accessory Intervertebral Motion Pt denies reproduction of back pain with CPA L1-L5 and UPA bilaterally L1-L5. Generally, hypomobile along L3-S1 and along T7-T12 with CPA    SPECIAL TESTS Lumbar Radiculopathy and Discogenic: Centralization and Peripheralization (SN 92, -LR 0.12): Mild axial pain transiently with repeated extension, no apparent peripheralization/centralization Slump (SN 83, -LR 0.32): R: Negative L: Negative SLR (SN 92, -LR 0.29): R: Positive L:  Negative Crossed SLR (SP 90): R: Negative L: Negative  Facet Joint: Extension-Rotation (SN 100, -LR 0.0): R: Positive L: Negative  Lumbar Foraminal Stenosis: Lumbar quadrant (SN 70): R: Positive for mild R flank pain L: Negative  Hip: FABER (SN 81): R: Positive for pain in lateral hip L: Negative  Piriformis Syndrome: PACE Sign: Negative    TODAY'S TREATMENT   Subjective: pt reports improvement in symptoms since injections. Been compliant with HEP. Pain currently only a 3/10 NPS. Improvement in Radicular symptoms.    Therapeutic Exercise: Nu-Step L3 for 5 minutes for thoracolumbar mobility  Review of Hep as prescribed below Access Code: [email or web address] URL: [email or web address] Date: 12/21/2021 Prepared by: Valentina Gu  Exercises - Standing Lumbar Extension with Counter  - 5-6 x daily - 7 x weekly - 1 sets - 10 reps - 1sec hold - Sidelying Thoracic Rotation with Open Book  - 2 x daily - 7 x weekly -  2 sets - 10 reps - 2sec hold  Lumbar trunk rotations: 2x10/direction. VC's for speed of motion.     Seated thoracic extension:  pillow at low back to stabilize lumbar spine and 1/2 bolster at mid back as TC for thoracic extension. 2x12.    Prone hip extension with knees extended: 3x8. Min VC's for form/technique. Good carryover.    Side  lying hip abduction: 3x8. Min multi modal cues for form/technique.   Glut bridges: 3x8 reps. Min multi modal cuing for form/technique. Good carryover with cues.    PATIENT EDUCATION:  Education details: form/technique with exercise. Person educated: Patient Education method: Explanation, Demonstration, and Handouts Education comprehension: verbalized understanding and returned demonstration   HOME EXERCISE PROGRAM: Access Code: [email or web address] URL: [email or web address] Date: 12/21/2021 Prepared by: Valentina Gu  Exercises - Standing Lumbar Extension with Counter  - 5-6 x daily - 7 x weekly - 1 sets - 10 reps - 1sec hold - Sidelying Thoracic Rotation with Open Book  - 2 x daily - 7 x weekly - 2 sets - 10 reps - 2sec hold   ASSESSMENT:  CLINICAL IMPRESSION: Pt presents for f/u session after eval. Pt reporting reduction in pain since lumbar injections and initiating PT HEP. Pt tolerating progression of thoracolumbar mobility and LE strengthening without exacerbation of pain today. Pt required intermittent min VC's for therex but demonstrates good carryover with all exercise. Encouraged with next session to report symptoms response with today's treatment progression. Pt will continue to benefit from skilled PT services to progress mobility, decrease pain, and improve strength to return to PLOF.   REHAB POTENTIAL: Good  CLINICAL DECISION MAKING: Evolving/moderate complexity  EVALUATION COMPLEXITY: Moderate   GOALS:  SHORT TERM GOALS: Target date: 01/11/2022  Pt will be independent with HEP to improve strength and decrease back pain to improve pain-free function at home and work. Baseline: 12/21/21: Baseline HEP initiated Goal status: INITIAL   LONG TERM GOALS: Target date: 02/08/2022  Pt will increase FOTO to at least 63 to demonstrate significant improvement in function at home and work related to back pain  Baseline:  12/21/21: 56 Goal status: INITIAL  2.  Pt will  decrease worst back pain by at least 2 points on the NPRS in order to demonstrate clinically significant reduction in back pain. Baseline: 12/21/21: 10/10 at worst.  Goal status: INITIAL  3.  Patient will have full thoracolumbar AROM without reproduction of symptoms as needed for self-care ADLs, reaching, bending to retrieve low-lying items, household chores    Baseline: 12/21/21: Decreased motion with R lateral flexion and L rotation; pain reproduction with extension, bilateral lateral flexion, and R rotation.  Goal status: INITIAL  4.  Patient will complete shopping outing with his wife without increase in back pain > 1-2/10 as needed for participation in community activity with his spouse Baseline: 12/21/21: Pain with prolonged standing and community outings with spouse  Goal status: INITIAL  5.  Patient will perform golf swing with full backswing and follow-through with sound power production and no increase in R flank pain or hip pain as needed for participation in golfing recreationally with friends Baseline: 12/21/21: Intermittent significant pain with golf swing Goal status: INITIAL    PLAN: PT FREQUENCY: 2x/week  PT DURATION: 6 weeks  PLANNED INTERVENTIONS: Therapeutic exercises, Therapeutic activity, Patient/Family education, Joint manipulation, Joint mobilization, Dry Needling, Electrical stimulation, Spinal mobilization, Cryotherapy, Moist heat, Traction, and Manual therapy  PLAN FOR NEXT SESSION: F/u on progression of treatment. Adapt accordingly.  Salem Caster. Fairly IV, PT, DPT Physical Therapist- [facility]  12/28/2021, 10:02 AM

## 2021-12-30 ENCOUNTER — Ambulatory Visit: Payer: Medicare PPO

## 2021-12-30 ENCOUNTER — Encounter: Payer: Self-pay | Admitting: Physical Therapy

## 2021-12-30 DIAGNOSIS — M25551 Pain in right hip: Secondary | ICD-10-CM

## 2021-12-30 DIAGNOSIS — M6281 Muscle weakness (generalized): Secondary | ICD-10-CM

## 2021-12-30 DIAGNOSIS — M5459 Other low back pain: Secondary | ICD-10-CM

## 2021-12-30 NOTE — Therapy (Signed)
OUTPATIENT PHYSICAL THERAPY THORACOLUMBAR TREATMENT   Patient Name: Tyler Roberts MRN: [id number] DOB:[birth year], 66 y.o., male Today's Date: 12/30/2021   PT End of Session - 12/30/21 0927     Visit Number 3    Number of Visits 13    Date for PT Re-Evaluation 02/01/22    Authorization Type [employer] 2023 - VL based on authorization    Authorization Time Period initial eval 12/21/21    Progress Note Due on Visit 10    PT Start Time 0930    PT Stop Time 1013    PT Time Calculation (min) 43 min    Activity Tolerance No increased pain;Patient tolerated treatment well    Behavior During Therapy [facility] for tasks assessed/performed             Past Medical History:  Diagnosis Date   Actinic keratosis    Shingles 09/26/2014   Past Surgical History:  Procedure Laterality Date   HERNIA REPAIR  1990   KNEE SURGERY Right 2008   Due to a staph infection   LASIK     NOSE SURGERY  2002   TONSILLECTOMY AND ADENOIDECTOMY  2002   Patient Active Problem List   Diagnosis Date Noted   Hypogonadism male 04/13/2021   Erectile dysfunction 10/08/2015   LBP (low back pain) 09/26/2014   Allergic rhinitis 09/28/2009   Benign essential hypertension 09/23/2009   Fam hx-ischem heart disease 09/05/2006   Hypotestosteronism 09/05/2006   Obstructive apnea 03/03/2006   Hyperglyceridemia, pure 09/13/2005   Acid reflux 04/06/2003    PCP: [name], MD  REFERRING PROVIDER: [name], MD  REFERRING DIAGNOSIS: M54.16 (ICD-10-CM) - Radiculopathy, lumbar region  THERAPY DIAG: Other low back pain  Pain in right hip  Muscle weakness (generalized)  RATIONALE FOR EVALUATION AND TREATMENT: Rehabilitation  ONSET DATE: episodic pain last 5 years, most recent flare-up around 09/03/2021  FOLLOW UP APPT WITH PROVIDER: Yes , follow up with MD in November    SUBJECTIVE:                                                                                                                                                                                          Chief Complaint: Patient is a 66 year old male with primary complaint of low back pain, referred for lumbar radiculopathy.   Pertinent History Patient is a 66 year old male with primary complaint of low back pain, referred for lumbar radiculopathy. Pt reports over 5 years of back problems. He states he was going to orthopedist - pt was referred to Dr. Ronnald Ramp (neurosurgeon) in [place], [place]. Pt has been getting steroid injections over last few  years and this has helped "off and on." He reports having one in June that didn't help; he is having another one 12/22/21 at different spinal level. Most recent MRI showing ongoing bulging discs and degenerative changes.  Pt reports pain with playing golf. He gets stabbing pain in back with bending over. MD informed pt that if he gets surgery, it will include hardware/fusion. Pt reports difficulty getting up in AM usually. Pt reports intermittent disturbed sleep due to back hurting - pt reports this can get better with either lying flat on back or lying on side with knees to chest. Patient reports no sudden weight loss. No bowel/bladder changes. No personal cardiac Hx (pt does have 1st-degree family Hx). No history of cancers. Symptoms are variable. Pt reports not having pain when on the move and walking. Pt was informed of mild levoscoliosis per recent imaging.   Pain:  Pain Intensity: Present: 1/10, Best: /10, Worst: 10/10 Pain location: Low back, R hip, R flank; intermittently to R groin  Pain Quality: stabbing  Radiating: Yes , to R hip/groin  Numbness/Tingling: Yes, in hands, none in lower extremities  Focal Weakness: yes, losing strength in his hands Aggravating factors: symptoms are worst with bending over, weed eating positioning, swinging golf club, prolonged standing in one spot Relieving factors: propping legs up, pillow on his low back, lying down in bed  24-hour pain behavior: worse in  AM History of prior back injury, pain, surgery, or therapy: Yes, episodic back pain over last 5 years  Falls: Has patient fallen in last 6 months? No, Number of falls: N/A Follow-up appointment with MD: Yes, in November Imaging: Yes , MRI about 2 weeks ago; pt was informed that there was no major changes on imaging versus previous scans  MRI of lumbar spine from 06/2016 1. Central/right subarticular disc extrusion with inferior migration at L2-3 with resultant moderate right subarticular stenosis. 2. Moderate right lateral recess stenosis at L3-4 related to degenerative disc bulge and facet disease. 3. Left eccentric disc bulging with facet disease at L4-5, resulting in moderate left lateral recess and foraminal stenosis. 4. Small left foraminal disc protrusion at L5-S1, closely approximating the exiting left L5 nerve root. 5. Additional more mild multilevel degenerative spondylolysis as above. Please see above report for a full description of these findings.  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; yard work,, washing cars [employer] (bowel/bladder changes, saddle paresthesia, personal history of cancer, h/o spinal tumors, h/o compression fx, h/o abdominal aneurysm, abdominal pain, chills/fever, night sweats, nausea, vomiting, unrelenting pain, first onset of insidious LBP <20 y/o): Negative  Precautions: None  Weight Bearing Restrictions: No  Living Environment Lives with: lives with their family and lives with their spouse, both are retired Lives in: House/apartment   Patient Goals: Strengthen back    OBJECTIVE:   Patient [place] 56, predicted outcome score of 98   Cognition Patient is oriented to person, place, and time.  Recent memory is intact.  Remote memory is intact.  Attention span and concentration are intact.  Expressive speech is intact.  Patient's fund of knowledge is within normal  limits for educational level.     Gross Musculoskeletal Assessment Tremor: None Bulk: Normal Tone: Normal No visible step-off along spinal column, no apparent rib flare or gross asymmetry in standing   GAIT: Mild dynamic varus in L knee with loading response   Posture: Lumbar lordosis: WNL Lumbar lateral shift: Negative Lower crossed  syndrome (tight hip flexors and erector spinae; weak gluts and abs): Negative   AROM  AROM (Normal range in degrees) AROM  12/30/2021  Lumbar   Flexion (65) 100%  Extension (30) 100%*  Right lateral flexion (25) 75%*  Left lateral flexion (25) 100%*  Right rotation (30) 100%*  Left rotation (30) 75%      Hip Right Left  Flexion (125) WNL WNL  Extension (15)    Abduction (40)    Adduction     Internal Rotation (45)    External Rotation (45) WNL WNL      (* = pain; Blank rows = not tested)   LE MMT:  MMT (out of 5) Right 12/30/2021 Left 12/30/2021  Hip flexion 5 5  Hip extension 4+ 4+  Hip abduction    Hip adduction    Hip internal rotation    Hip external rotation    Knee flexion 5 5  Knee extension 5 5  Ankle dorsiflexion 5 5  Ankle plantarflexion    Ankle inversion    Ankle eversion    (* = pain; Blank rows = not tested)   Sensation Grossly intact to light touch bilateral LEs as determined by testing dermatomes L2-S2, mild dec light touch sensation R great toe    Reflexes R/L Knee Jerk (L3/4): 2+/2+  Ankle Jerk (S1/2): 2+/1+  Clonus: R Negative, L Negative Homan's Sign: R Negative, L Negative   Muscle Length Hamstrings: R: Negative L: Negative Ely (quadriceps): R: Positive L: Positive Thomas (hip flexors): R: Not examined L: Not examined    Palpation  Location LEFT  RIGHT           Lumbar paraspinals 0 0  Quadratus Lumborum 0 0  Gluteus Maximus  1  Gluteus Medius  1  Deep hip external rotators  0  PSIS 0 0  Fortin's Area (SIJ)    Greater Trochanter  0  (Blank rows = not tested) Graded on 0-4 scale  (0 = no pain, 1 = pain, 2 = pain with wincing/grimacing/flinching, 3 = pain with withdrawal, 4 = unwilling to allow palpation), (Blank rows = not tested)   Passive Accessory Intervertebral Motion Pt denies reproduction of back pain with CPA L1-L5 and UPA bilaterally L1-L5. Generally, hypomobile along L3-S1 and along T7-T12 with CPA    SPECIAL TESTS Lumbar Radiculopathy and Discogenic: Centralization and Peripheralization (SN 92, -LR 0.12): Mild axial pain transiently with repeated extension, no apparent peripheralization/centralization Slump (SN 83, -LR 0.32): R: Negative L: Negative SLR (SN 92, -LR 0.29): R: Positive L:  Negative Crossed SLR (SP 90): R: Negative L: Negative  Facet Joint: Extension-Rotation (SN 100, -LR 0.0): R: Positive L: Negative  Lumbar Foraminal Stenosis: Lumbar quadrant (SN 70): R: Positive for mild R flank pain L: Negative  Hip: FABER (SN 81): R: Positive for pain in lateral hip L: Negative  Piriformis Syndrome: PACE Sign: Negative    TODAY'S TREATMENT   Subjective: pt reports only 1/10 pain in R low back/hip. No soreness in LE's or flare up of symptoms with last session.    Therapeutic Exercise: Nu-Step L4 for 5 minutes for thoracolumbar rotation mobility.  Seated physioball rollouts forwards, R/L lateral deviations: x15/direction, 2-3 sec holds    Side lying open books for thoracic mobility: 2x20/direction   Lumbar trunk rotations: 1x20/direction. Good carryover from previous session  Hook lying single leg glut bridge with contralateral SLR: 2x8/LE. Min VC's for form/technique. Excellent carryover.    Seated thoracic extension: pillow at low  back to stabilize lumbar spine and small bolster at mid back as TC for thoracic extension. 2x12.     Prone hip extension with knees extended: 3x8. Min VC's for form/technique. Good carryover. 4# AW at distal femur. Min TC's for neutral pelvis positioning.   Side lying hip abduction: 3x8. Good carryover  from previous session. 4# AW applied.   Standing anti-rotation press at [employer]: 20# isometric holds. 3x30 sec holds/side. Min multi modal cuing initially for spine positioning. Excellent carryover after cues.      PATIENT EDUCATION:  Education details: form/technique with exercise. Person educated: Patient Education method: Explanation, Demonstration, and Handouts Education comprehension: verbalized understanding and returned demonstration   HOME EXERCISE PROGRAM: Access Code: [email or web address] URL: [email or web address] Date: 12/21/2021 Prepared by: Valentina Gu  Exercises - Standing Lumbar Extension with Counter  - 5-6 x daily - 7 x weekly - 1 sets - 10 reps - 1sec hold - Sidelying Thoracic Rotation with Open Book  - 2 x daily - 7 x weekly - 2 sets - 10 reps - 2sec hold   ASSESSMENT:  CLINICAL IMPRESSION Continuing PT POC with progression of thoracolumbar mobility and LE strengthening. Introducing core strengthening exercises as well without aggravation of symptoms. Pt tolerating increased LE loading requiring min VC's for form/technique for LE, pelvic, and spine positioning. Pt reports playing golf tomorrow and educated by PT today to update primary PT on symptoms with golfing. Pt will continue to benefit from skilled PT services to progress mobility, decrease pain, and improve strength to return to PLOF.   REHAB POTENTIAL: Good  CLINICAL DECISION MAKING: Evolving/moderate complexity  EVALUATION COMPLEXITY: Moderate   GOALS:  SHORT TERM GOALS: Target date: 01/13/2022  Pt will be independent with HEP to improve strength and decrease back pain to improve pain-free function at home and work. Baseline: 12/21/21: Baseline HEP initiated Goal status: INITIAL   LONG TERM GOALS: Target date: 02/10/2022  Pt will increase FOTO to at least 63 to demonstrate significant improvement in function at home and work related to back pain  Baseline:  12/21/21: 56 Goal status:  INITIAL  2.  Pt will decrease worst back pain by at least 2 points on the NPRS in order to demonstrate clinically significant reduction in back pain. Baseline: 12/21/21: 10/10 at worst.  Goal status: INITIAL  3.  Patient will have full thoracolumbar AROM without reproduction of symptoms as needed for self-care ADLs, reaching, bending to retrieve low-lying items, household chores    Baseline: 12/21/21: Decreased motion with R lateral flexion and L rotation; pain reproduction with extension, bilateral lateral flexion, and R rotation.  Goal status: INITIAL  4.  Patient will complete shopping outing with his wife without increase in back pain > 1-2/10 as needed for participation in community activity with his spouse Baseline: 12/21/21: Pain with prolonged standing and community outings with spouse  Goal status: INITIAL  5.  Patient will perform golf swing with full backswing and follow-through with sound power production and no increase in R flank pain or hip pain as needed for participation in golfing recreationally with friends Baseline: 12/21/21: Intermittent significant pain with golf swing Goal status: INITIAL    PLAN: PT FREQUENCY: 2x/week  PT DURATION: 6 weeks  PLANNED INTERVENTIONS: Therapeutic exercises, Therapeutic activity, Patient/Family education, Joint manipulation, Joint mobilization, Dry Needling, Electrical stimulation, Spinal mobilization, Cryotherapy, Moist heat, Traction, and Manual therapy  PLAN FOR NEXT SESSION: F/u on progression of treatment. Adapt accordingly.    Salem Caster. Fairly IV, PT, DPT Physical Therapist-  [facility]  12/30/2021, 10:18 AM

## 2022-01-04 ENCOUNTER — Ambulatory Visit: Payer: Medicare PPO | Attending: Neurological Surgery

## 2022-01-04 DIAGNOSIS — M6281 Muscle weakness (generalized): Secondary | ICD-10-CM | POA: Insufficient documentation

## 2022-01-04 DIAGNOSIS — M25551 Pain in right hip: Secondary | ICD-10-CM | POA: Diagnosis present

## 2022-01-04 DIAGNOSIS — M5459 Other low back pain: Secondary | ICD-10-CM | POA: Insufficient documentation

## 2022-01-04 NOTE — Therapy (Signed)
OUTPATIENT PHYSICAL THERAPY THORACOLUMBAR TREATMENT   Patient Name: Tyler Roberts MRN: [id number] DOB:1956-01-27, 66 y.o., male Today's Date: 01/04/2022   PT End of Session - 01/04/22 0940     Visit Number 4    Number of Visits 13    Date for PT Re-Evaluation 02/01/22    Authorization Type [employer] 2023 - VL based on authorization    Authorization Time Period initial eval 12/21/21    Progress Note Due on Visit 10    PT Start Time 0930    PT Stop Time 1015    PT Time Calculation (min) 45 min    Activity Tolerance No increased pain;Patient tolerated treatment well    Behavior During Therapy [facility] for tasks assessed/performed             Past Medical History:  Diagnosis Date   Actinic keratosis    Shingles 09/26/2014   Past Surgical History:  Procedure Laterality Date   HERNIA REPAIR  1990   KNEE SURGERY Right 2008   Due to a staph infection   LASIK     NOSE SURGERY  2002   TONSILLECTOMY AND ADENOIDECTOMY  2002   Patient Active Problem List   Diagnosis Date Noted   Hypogonadism male 04/13/2021   Erectile dysfunction 10/08/2015   LBP (low back pain) 09/26/2014   Allergic rhinitis 09/28/2009   Benign essential hypertension 09/23/2009   Fam hx-ischem heart disease 09/05/2006   Hypotestosteronism 09/05/2006   Obstructive apnea 03/03/2006   Hyperglyceridemia, pure 09/13/2005   Acid reflux 04/06/2003    PCP: [name], MD  REFERRING PROVIDER: [name], MD  REFERRING DIAGNOSIS: M54.16 (ICD-10-CM) - Radiculopathy, lumbar region  THERAPY DIAG: Other low back pain  RATIONALE FOR EVALUATION AND TREATMENT: Rehabilitation  ONSET DATE: episodic pain last 5 years, most recent flare-up around 09/03/2021  FOLLOW UP APPT WITH PROVIDER: Yes , follow up with MD in November    SUBJECTIVE:                                                                                                                                                                                          Chief Complaint: Patient is a 66 year old male with primary complaint of low back pain, referred for lumbar radiculopathy.   Pertinent History Patient is a 66 year old male with primary complaint of low back pain, referred for lumbar radiculopathy. Pt reports over 5 years of back problems. He states he was going to orthopedist - pt was referred to Dr. Ronnald Ramp (neurosurgeon) in [place], [place]. Pt has been getting steroid injections over last few years and this has helped "off and on." He  reports having one in June that didn't help; he is having another one 12/22/21 at different spinal level. Most recent MRI showing ongoing bulging discs and degenerative changes.  Pt reports pain with playing golf. He gets stabbing pain in back with bending over. MD informed pt that if he gets surgery, it will include hardware/fusion. Pt reports difficulty getting up in AM usually. Pt reports intermittent disturbed sleep due to back hurting - pt reports this can get better with either lying flat on back or lying on side with knees to chest. Patient reports no sudden weight loss. No bowel/bladder changes. No personal cardiac Hx (pt does have 1st-degree family Hx). No history of cancers. Symptoms are variable. Pt reports not having pain when on the move and walking. Pt was informed of mild levoscoliosis per recent imaging.   Pain:  Pain Intensity: Present: 1/10, Best: /10, Worst: 10/10 Pain location: Low back, R hip, R flank; intermittently to R groin  Pain Quality: stabbing  Radiating: Yes , to R hip/groin  Numbness/Tingling: Yes, in hands, none in lower extremities  Focal Weakness: yes, losing strength in his hands Aggravating factors: symptoms are worst with bending over, weed eating positioning, swinging golf club, prolonged standing in one spot Relieving factors: propping legs up, pillow on his low back, lying down in bed  24-hour pain behavior: worse in AM History of prior back injury, pain, surgery, or therapy:  Yes, episodic back pain over last 5 years  Falls: Has patient fallen in last 6 months? No, Number of falls: N/A Follow-up appointment with MD: Yes, in November Imaging: Yes , MRI about 2 weeks ago; pt was informed that there was no major changes on imaging versus previous scans  MRI of lumbar spine from 06/2016 1. Central/right subarticular disc extrusion with inferior migration at L2-3 with resultant moderate right subarticular stenosis. 2. Moderate right lateral recess stenosis at L3-4 related to degenerative disc bulge and facet disease. 3. Left eccentric disc bulging with facet disease at L4-5, resulting in moderate left lateral recess and foraminal stenosis. 4. Small left foraminal disc protrusion at L5-S1, closely approximating the exiting left L5 nerve root. 5. Additional more mild multilevel degenerative spondylolysis as above. Please see above report for a full description of these findings.  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; yard work,, washing cars [employer] (bowel/bladder changes, saddle paresthesia, personal history of cancer, h/o spinal tumors, h/o compression fx, h/o abdominal aneurysm, abdominal pain, chills/fever, night sweats, nausea, vomiting, unrelenting pain, first onset of insidious LBP <20 y/o): Negative  Precautions: None  Weight Bearing Restrictions: No  Living Environment Lives with: lives with their family and lives with their spouse, both are retired Lives in: House/apartment   Patient Goals: Strengthen back    OBJECTIVE:   Patient Tyler Roberts 56, predicted outcome score of 41   Cognition Patient is oriented to person, place, and time.  Recent memory is intact.  Remote memory is intact.  Attention span and concentration are intact.  Expressive speech is intact.  Patient's fund of knowledge is within normal limits for educational level.     Gross Musculoskeletal  Assessment Tremor: None Bulk: Normal Tone: Normal No visible step-off along spinal column, no apparent rib flare or gross asymmetry in standing   GAIT: Mild dynamic varus in L knee with loading response   Posture: Lumbar lordosis: WNL Lumbar lateral shift: Negative Lower crossed syndrome (tight hip flexors and erector spinae; weak gluts  and abs): Negative   AROM  AROM (Normal range in degrees) AROM  01/04/2022  Lumbar   Flexion (65) 100%  Extension (30) 100%*  Right lateral flexion (25) 75%*  Left lateral flexion (25) 100%*  Right rotation (30) 100%*  Left rotation (30) 75%      Hip Right Left  Flexion (125) WNL WNL  Extension (15)    Abduction (40)    Adduction     Internal Rotation (45)    External Rotation (45) WNL WNL      (* = pain; Blank rows = not tested)   LE MMT:  MMT (out of 5) Right 01/04/2022 Left 01/04/2022  Hip flexion 5 5  Hip extension 4+ 4+  Hip abduction    Hip adduction    Hip internal rotation    Hip external rotation    Knee flexion 5 5  Knee extension 5 5  Ankle dorsiflexion 5 5  Ankle plantarflexion    Ankle inversion    Ankle eversion    (* = pain; Blank rows = not tested)   Sensation Grossly intact to light touch bilateral LEs as determined by testing dermatomes L2-S2, mild dec light touch sensation R great toe    Reflexes R/L Knee Jerk (L3/4): 2+/2+  Ankle Jerk (S1/2): 2+/1+  Clonus: R Negative, L Negative Homan's Sign: R Negative, L Negative   Muscle Length Hamstrings: R: Negative L: Negative Ely (quadriceps): R: Positive L: Positive Thomas (hip flexors): R: Not examined L: Not examined    Palpation  Location LEFT  RIGHT           Lumbar paraspinals 0 0  Quadratus Lumborum 0 0  Gluteus Maximus  1  Gluteus Medius  1  Deep hip external rotators  0  PSIS 0 0  Fortin's Area (SIJ)    Greater Trochanter  0  (Blank rows = not tested) Graded on 0-4 scale (0 = no pain, 1 = pain, 2 = pain with  wincing/grimacing/flinching, 3 = pain with withdrawal, 4 = unwilling to allow palpation), (Blank rows = not tested)   Passive Accessory Intervertebral Motion Pt denies reproduction of back pain with CPA L1-L5 and UPA bilaterally L1-L5. Generally, hypomobile along L3-S1 and along T7-T12 with CPA    SPECIAL TESTS Lumbar Radiculopathy and Discogenic: Centralization and Peripheralization (SN 92, -LR 0.12): Mild axial pain transiently with repeated extension, no apparent peripheralization/centralization Slump (SN 83, -LR 0.32): R: Negative L: Negative SLR (SN 92, -LR 0.29): R: Positive L:  Negative Crossed SLR (SP 90): R: Negative L: Negative  Facet Joint: Extension-Rotation (SN 100, -LR 0.0): R: Positive L: Negative  Lumbar Foraminal Stenosis: Lumbar quadrant (SN 70): R: Positive for mild R flank pain L: Negative  Hip: FABER (SN 81): R: Positive for pain in lateral hip L: Negative  Piriformis Syndrome: PACE Sign: Negative    TODAY'S TREATMENT   Subjective: pt reports only 1/10 pain in R low back/hip.  He feels the most stiff in the morning and better as the day goes on.  He feels like the injection was helpful.  He played golf 1x this week for the first time in a few weeks and was happy he could do it.  His back didn't flare up afterwards.      Therapeutic Exercise: Nu-Step L4 for 6  minutes for thoracolumbar rotation mobility.  Thoraco-Lumbar AROM: L rotation is normal, R rotation min limited, flexion min limited, extension min limited  Seated physioball rollouts forwards, R/L lateral deviations:  x5/direction, 2-3 sec holds    Side lying open books for thoracic mobility: 2x20/direction   Lumbar trunk rotations: 1x20/direction. Good carryover from previous session  Figure 4 hip ER stretch R LE: 30 sec x 3 (also added to HEP today)  Hook lying single leg glut bridge with contralateral SLR: 2x8/LE. Min VC's for form/technique. Excellent carryover.    Seated thoracic  extension: pillow at low back to stabilize lumbar spine and small bolster at mid back as TC for thoracic extension. 2x12.     Prone hip extension with knees extended: 3x8. Min VC's for form/technique. Good carryover. 4# AW at distal femur. Min TC's for neutral pelvis positioning.   Side lying hip abduction: 3x8. Good carryover from previous session. 4# AW applied. PT tactile cues for technique to target for leg lift  Squats: x8 to chair; 2x8 with 10# weight held in front- PT cues for focusing in hip hinge and neutral spine and glute activation  Standing anti-rotation press at [employer]: 20# isometric holds. 3x30 sec holds/side. Min multi modal cuing initially for spine positioning. Excellent carryover after cues. Not today     PATIENT EDUCATION:  Education details: form/technique with exercise. Person educated: Patient Education method: Explanation, Demonstration, and Handouts Education comprehension: verbalized understanding and returned demonstration   HOME EXERCISE PROGRAM: Access Code: [email or web address] URL: [email or web address] Date: 12/21/2021 Prepared by: Valentina Gu  Exercises - Standing Lumbar Extension with Counter  - 5-6 x daily - 7 x weekly - 1 sets - 10 reps - 1sec hold - Sidelying Thoracic Rotation with Open Book  - 2 x daily - 7 x weekly - 2 sets - 10 reps - 2sec hold   ASSESSMENT:  CLINICAL IMPRESSION Continuing PT POC with progression of thoracolumbar mobility and LE strengthening.  Added squat training into rehab program today and R hip ER mobility exercise to HEP to promote optimal spine/LE mechanics with R handed golf swing.  Pt will continue to benefit from skilled PT services to progress mobility, decrease pain, and improve strength to return to PLOF.   REHAB POTENTIAL: Good  CLINICAL DECISION MAKING: Evolving/moderate complexity  EVALUATION COMPLEXITY: Moderate   GOALS:  SHORT TERM GOALS: Target date: 01/18/2022  Pt will be independent  with HEP to improve strength and decrease back pain to improve pain-free function at home and work. Baseline: 12/21/21: Baseline HEP initiated Goal status: INITIAL   LONG TERM GOALS: Target date: 02/15/2022  Pt will increase FOTO to at least 63 to demonstrate significant improvement in function at home and work related to back pain  Baseline:  12/21/21: 56 Goal status: INITIAL  2.  Pt will decrease worst back pain by at least 2 points on the NPRS in order to demonstrate clinically significant reduction in back pain. Baseline: 12/21/21: 10/10 at worst.  Goal status: INITIAL  3.  Patient will have full thoracolumbar AROM without reproduction of symptoms as needed for self-care ADLs, reaching, bending to retrieve low-lying items, household chores    Baseline: 12/21/21: Decreased motion with R lateral flexion and L rotation; pain reproduction with extension, bilateral lateral flexion, and R rotation.  Goal status: INITIAL  4.  Patient will complete shopping outing with his wife without increase in back pain > 1-2/10 as needed for participation in community activity with his spouse Baseline: 12/21/21: Pain with prolonged standing and community outings with spouse  Goal status: INITIAL  5.  Patient will perform golf swing with full backswing and follow-through with sound power production and no increase in  R flank pain or hip pain as needed for participation in golfing recreationally with friends Baseline: 12/21/21: Intermittent significant pain with golf swing Goal status: INITIAL    PLAN: PT FREQUENCY: 2x/week  PT DURATION: 6 weeks  PLANNED INTERVENTIONS: Therapeutic exercises, Therapeutic activity, Patient/Family education, Joint manipulation, Joint mobilization, Dry Needling, Electrical stimulation, Spinal mobilization, Cryotherapy, Moist heat, Traction, and Manual therapy  PLAN FOR NEXT SESSION: F/u on progression of treatment. Adapt accordingly.    Merdis Delay, PT, DPT, OCS   [phone]  Physical Therapist- [facility]  01/04/2022, 1:17 PM

## 2022-01-05 NOTE — Therapy (Unsigned)
OUTPATIENT PHYSICAL THERAPY TREATMENT   Patient Name: Tyler Roberts MRN: [id number] DOB:[birth year], 66 y.o., male Today's Date: 01/07/2022   PT End of Session - 01/07/22 1225     Visit Number 5    Number of Visits 13    Date for PT Re-Evaluation 02/01/22    Authorization Type [employer] 2023 - VL based on authorization    Authorization Time Period initial eval 12/21/21, auth [id number]    Authorization - Number of Visits 11    Progress Note Due on Visit 10    PT Start Time 0934    PT Stop Time 1015    PT Time Calculation (min) 41 min    Activity Tolerance No increased pain;Patient tolerated treatment well    Behavior During Therapy [facility] for tasks assessed/performed              Past Medical History:  Diagnosis Date   Actinic keratosis    Shingles 09/26/2014   Past Surgical History:  Procedure Laterality Date   HERNIA REPAIR  1990   KNEE SURGERY Right 2008   Due to a staph infection   LASIK     NOSE SURGERY  2002   TONSILLECTOMY AND ADENOIDECTOMY  2002   Patient Active Problem List   Diagnosis Date Noted   Hypogonadism male 04/13/2021   Erectile dysfunction 10/08/2015   LBP (low back pain) 09/26/2014   Allergic rhinitis 09/28/2009   Benign essential hypertension 09/23/2009   Fam hx-ischem heart disease 09/05/2006   Hypotestosteronism 09/05/2006   Obstructive apnea 03/03/2006   Hyperglyceridemia, pure 09/13/2005   Acid reflux 04/06/2003    PCP: Tyler Sons, MD  REFERRING PROVIDER: [name], MD  REFERRING DIAGNOSIS: M54.16 (ICD-10-CM) - Radiculopathy, lumbar region  THERAPY DIAG: Other low back pain  Pain in right hip  Muscle weakness (generalized)  RATIONALE FOR EVALUATION AND TREATMENT: Rehabilitation  ONSET DATE: episodic pain last 5 years, most recent flare-up around 09/03/2021  FOLLOW UP APPT WITH PROVIDER: Yes , follow up with MD in November    SUBJECTIVE HISTORY (EVAL):                                                                                                                                                                                          Chief Complaint: Patient is a 66 year old male with primary complaint of low back pain, referred for lumbar radiculopathy.   Pertinent History Patient is a 66 year old male with primary complaint of low back pain, referred for lumbar radiculopathy. Pt reports over 5 years of back problems. He states he was going to orthopedist - pt was referred to Dr. Ronnald Roberts (  neurosurgeon) in [place], [place]. Pt has been getting steroid injections over last few years and this has helped "off and on." He reports having one in June that didn't help; he is having another one 12/22/21 at different spinal level. Most recent MRI showing ongoing bulging discs and degenerative changes.  Pt reports pain with playing golf. He gets stabbing pain in back with bending over. MD informed pt that if he gets surgery, it will include hardware/fusion. Pt reports difficulty getting up in AM usually. Pt reports intermittent disturbed sleep due to back hurting - pt reports this can get better with either lying flat on back or lying on side with knees to chest. Patient reports no sudden weight loss. No bowel/bladder changes. No personal cardiac Hx (pt does have 1st-degree family Hx). No history of cancers. Symptoms are variable. Pt reports not having pain when on the move and walking. Pt was informed of mild levoscoliosis per recent imaging.   Pain:  Pain Intensity: Present: 1/10, Best: /10, Worst: 10/10 Pain location: Low back, R hip, R flank; intermittently to R groin  Pain Quality: stabbing  Radiating: Yes , to R hip/groin  Numbness/Tingling: Yes, in hands, none in lower extremities  Focal Weakness: yes, losing strength in his hands Aggravating factors: symptoms are worst with bending over, weed eating positioning, swinging golf club, prolonged standing in one spot Relieving factors: propping legs up, pillow on his low back,  lying down in bed  24-hour pain behavior: worse in AM History of prior back injury, pain, surgery, or therapy: Yes, episodic back pain over last 5 years  Falls: Has patient fallen in last 6 months? No, Number of falls: N/A Follow-up appointment with MD: Yes, in November Imaging: Yes , MRI about 2 weeks ago; pt was informed that there was no major changes on imaging versus previous scans  MRI of lumbar spine from 06/2016 1. Central/right subarticular disc extrusion with inferior migration at L2-3 with resultant moderate right subarticular stenosis. 2. Moderate right lateral recess stenosis at L3-4 related to degenerative disc bulge and facet disease. 3. Left eccentric disc bulging with facet disease at L4-5, resulting in moderate left lateral recess and foraminal stenosis. 4. Small left foraminal disc protrusion at L5-S1, closely approximating the exiting left L5 nerve root. 5. Additional more mild multilevel degenerative spondylolysis as above. Please see above report for a full description of these findings.  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; yard work,, washing cars [employer] (bowel/bladder changes, saddle paresthesia, personal history of cancer, h/o spinal tumors, h/o compression fx, h/o abdominal aneurysm, abdominal pain, chills/fever, night sweats, nausea, vomiting, unrelenting pain, first onset of insidious LBP <20 y/o): Negative  Precautions: None  Weight Bearing Restrictions: No  Living Environment Lives with: lives with their family and lives with their spouse, both are retired Lives in: House/apartment   Patient Goals: Strengthen back    OBJECTIVE:   Patient [name] 56, predicted outcome score of 63   Gross Musculoskeletal Assessment Tremor: None Bulk: Normal Tone: Normal No visible step-off along spinal column, no apparent rib flare or gross asymmetry in standing   GAIT: Mild  dynamic varus in L knee with loading response   Posture: Lumbar lordosis: WNL Lumbar lateral shift: Negative Lower crossed syndrome (tight hip flexors and erector spinae; weak gluts and abs): Negative   AROM  AROM (Normal range in degrees) AROM  01/07/2022  Lumbar   Flexion (65) 100%  Extension (30)  100%*  Right lateral flexion (25) 75%*  Left lateral flexion (25) 100%*  Right rotation (30) 100%*  Left rotation (30) 75%      Hip Right Left  Flexion (125) WNL WNL  Extension (15)    Abduction (40)    Adduction     Internal Rotation (45)    External Rotation (45) WNL WNL      (* = pain; Blank rows = not tested)   LE MMT:  MMT (out of 5) Right 01/07/2022 Left 01/07/2022  Hip flexion 5 5  Hip extension 4+ 4+  Hip abduction    Hip adduction    Hip internal rotation    Hip external rotation    Knee flexion 5 5  Knee extension 5 5  Ankle dorsiflexion 5 5  Ankle plantarflexion    Ankle inversion    Ankle eversion    (* = pain; Blank rows = not tested)   Sensation Grossly intact to light touch bilateral LEs as determined by testing dermatomes L2-S2, mild dec light touch sensation R great toe    Reflexes R/L Knee Jerk (L3/4): 2+/2+  Ankle Jerk (S1/2): 2+/1+  Clonus: R Negative, L Negative Homan's Sign: R Negative, L Negative   Muscle Length Hamstrings: R: Negative L: Negative Ely (quadriceps): R: Positive L: Positive Thomas (hip flexors): R: Not examined L: Not examined    Palpation  Location LEFT  RIGHT           Lumbar paraspinals 0 0  Quadratus Lumborum 0 0  Gluteus Maximus  1  Gluteus Medius  1  Deep hip external rotators  0  PSIS 0 0  Fortin's Area (SIJ)    Greater Trochanter  0  (Blank rows = not tested) Graded on 0-4 scale (0 = no pain, 1 = pain, 2 = pain with wincing/grimacing/flinching, 3 = pain with withdrawal, 4 = unwilling to allow palpation), (Blank rows = not tested)   Passive Accessory Intervertebral Motion Pt denies reproduction  of back pain with CPA L1-L5 and UPA bilaterally L1-L5. Generally, hypomobile along L3-S1 and along T7-T12 with CPA    SPECIAL TESTS Lumbar Radiculopathy and Discogenic: Centralization and Peripheralization (SN 92, -LR 0.12): Mild axial pain transiently with repeated extension, no apparent peripheralization/centralization Slump (SN 83, -LR 0.32): R: Negative L: Negative SLR (SN 92, -LR 0.29): R: Positive L:  Negative Crossed SLR (SP 90): R: Negative L: Negative  Facet Joint: Extension-Rotation (SN 100, -LR 0.0): R: Positive L: Negative  Lumbar Foraminal Stenosis: Lumbar quadrant (SN 70): R: Positive for mild R flank pain L: Negative  Hip: FABER (SN 81): R: Positive for pain in lateral hip L: Negative  Piriformis Syndrome: PACE Sign: Negative    TODAY'S TREATMENT   Subjective: Patient reports doing well last week. He reports worsening pain Monday evening. He reports bending over Monday evening and had flare-up of stabbing pain in R flank region. 5/10 pain at arrival this AM. Pt reports performing repeated extension x10 for 2x/day.    Manual Therapy - for symptom modulation, soft tissue sensitivity via lumbar spine nerve root decompression  Manual lumbar traction, bilateral long-leg distraction technique in supine; 10 sec on, 5 sec off; x 5 minutes for symptom modulation    Therapeutic Exercise:  Thoraco-Lumbar AROM: L rotation is normal, R rotation min limited, flexion min limited, extension is normal   Repeated extension in standing; 2x10   Repeated extension in lying; 2x10   Side lying open books for thoracic mobility: 1x20/direction  Prone hip extension with knees extended: 3x8. Min VC's for form/technique. 4# AW at ankles. Min TC's for avoidance of trunk rotation.    Side lying hip abduction: 3x8. 4# AW. PT tactile cues and verbal cues to avoid hip flexion/TFL compensation.    PATIENT EDUCATION: Reviewed repeated movement program parameters and modified  primary repeated movement to REIL (prone press-up).    *next visit* Hook lying single leg glut bridge with contralateral SLR: 2x8/LE. Squats: x8 to chair; 2x8 with 10# weight held in front- PT cues for focusing in hip hinge and neutral spine and glute activation     *not today* Nu-Step L4 for 6  minutes for thoracolumbar rotation mobility. Standing anti-rotation press at [employer]ilus: 20# isometric holds. 3x30 sec holds/side. Min multi modal cuing initially for spine positioning. Excellent carryover after cues. Not today Figure 4 hip ER stretch R LE: 30 sec x 3 (also added to HEP today) Lumbar trunk rotations: 1x20/direction. Good carryover from previous session Seated thoracic extension: pillow at low back to stabilize lumbar spine and small bolster at mid back as TC for thoracic extension. 2x12.      PATIENT EDUCATION:  Education details: form/technique with exercise. Person educated: Patient Education method: Explanation, Demonstration, and Handouts Education comprehension: verbalized understanding and returned demonstration   HOME EXERCISE PROGRAM: Access Code: [email or web address] URL: [email or web address] Date: 12/21/2021 Prepared by: Valentina Gu  Exercises - Prone Press Up - 5-6 x daily - 7 x weekly - 1 sets - 10 reps - 1sec hold - Sidelying Thoracic Rotation with Open Book  - 2 x daily - 7 x weekly - 2 sets - 10 reps - 2sec hold   ASSESSMENT:  CLINICAL IMPRESSION Patient had increase in pain this past Monday when leaning forward in a seated position after having minimal symptoms and participating in golf and all desired activities over the previous week. Patient has pain primary affecting R flank with Hx of referral to R groin - no classic sciatic-type symptoms. Patient has no significant change with repeated extension in standing. Symptoms are modestly abated with repeated extension in lying and use of manual lumbar traction. Pt to continue with frequent repeated  extension at home given apparent direction of preference; pt instructed on increasing frequency for repeated movement given limited frequency performed recently with HEP. Pt will continue to benefit from skilled PT services to progress mobility, decrease pain, and improve strength to return to PLOF.   REHAB POTENTIAL: Good  CLINICAL DECISION MAKING: Evolving/moderate complexity  EVALUATION COMPLEXITY: Moderate   GOALS:  SHORT TERM GOALS: Target date: 01/21/2022  Pt will be independent with HEP to improve strength and decrease back pain to improve pain-free function at home and work. Baseline: 12/21/21: Baseline HEP initiated Goal status: INITIAL   LONG TERM GOALS: Target date: 02/18/2022  Pt will increase FOTO to at least 63 to demonstrate significant improvement in function at home and work related to back pain  Baseline:  12/21/21: 56 Goal status: INITIAL  2.  Pt will decrease worst back pain by at least 2 points on the NPRS in order to demonstrate clinically significant reduction in back pain. Baseline: 12/21/21: 10/10 at worst.  Goal status: INITIAL  3.  Patient will have full thoracolumbar AROM without reproduction of symptoms as needed for self-care ADLs, reaching, bending to retrieve low-lying items, household chores    Baseline: 12/21/21: Decreased motion with R lateral flexion and L rotation; pain reproduction with extension, bilateral lateral flexion, and R rotation.  Goal status: INITIAL  4.  Patient will complete shopping outing with his wife without increase in back pain > 1-2/10 as needed for participation in community activity with his spouse Baseline: 12/21/21: Pain with prolonged standing and community outings with spouse  Goal status: INITIAL  5.  Patient will perform golf swing with full backswing and follow-through with sound power production and no increase in R flank pain or hip pain as needed for participation in golfing recreationally with friends Baseline:  12/21/21: Intermittent significant pain with golf swing Goal status: INITIAL    PLAN: PT FREQUENCY: 2x/week  PT DURATION: 6 weeks  PLANNED INTERVENTIONS: Therapeutic exercises, Therapeutic activity, Patient/Family education, Joint manipulation, Joint mobilization, Dry Needling, Electrical stimulation, Spinal mobilization, Cryotherapy, Moist heat, Traction, and Manual therapy  PLAN FOR NEXT SESSION: F/u on response with repeated extension in lying with increased frequency and activity modification. Continue with graded movement and progress hip strengthening and trunk stabilization as able.    Valentina Gu, PT, DPT #[id number]  Eilleen Kempf 01/07/2022, 12:44 PM

## 2022-01-06 ENCOUNTER — Ambulatory Visit: Payer: Medicare PPO | Admitting: Physical Therapy

## 2022-01-06 DIAGNOSIS — M5459 Other low back pain: Secondary | ICD-10-CM

## 2022-01-06 DIAGNOSIS — M25551 Pain in right hip: Secondary | ICD-10-CM

## 2022-01-06 DIAGNOSIS — M6281 Muscle weakness (generalized): Secondary | ICD-10-CM

## 2022-01-07 ENCOUNTER — Encounter: Payer: Self-pay | Admitting: Physical Therapy

## 2022-01-11 ENCOUNTER — Ambulatory Visit: Payer: Medicare PPO | Admitting: Physical Therapy

## 2022-01-11 ENCOUNTER — Encounter: Payer: Self-pay | Admitting: Physical Therapy

## 2022-01-11 DIAGNOSIS — M5459 Other low back pain: Secondary | ICD-10-CM

## 2022-01-11 DIAGNOSIS — M25551 Pain in right hip: Secondary | ICD-10-CM

## 2022-01-11 DIAGNOSIS — M6281 Muscle weakness (generalized): Secondary | ICD-10-CM

## 2022-01-11 NOTE — Therapy (Signed)
OUTPATIENT PHYSICAL THERAPY TREATMENT   Patient Name: Tyler Roberts MRN: [id number] DOB:01/16/1956, 66 y.o., male Today's Date: 01/11/2022   PT End of Session - 01/11/22 0931     Visit Number 6    Number of Visits 13    Date for PT Re-Evaluation 02/01/22    Authorization Type [employer] 2023 - VL based on authorization    Authorization Time Period initial eval 12/21/21, auth [id number]    Authorization - Number of Visits 11    Progress Note Due on Visit 10    PT Start Time 0932    PT Stop Time 1016    PT Time Calculation (min) 44 min    Activity Tolerance No increased pain;Patient tolerated treatment well    Behavior During Therapy [facility] for tasks assessed/performed               Past Medical History:  Diagnosis Date   Actinic keratosis    Shingles 09/26/2014   Past Surgical History:  Procedure Laterality Date   HERNIA REPAIR  1990   KNEE SURGERY Right 2008   Due to a staph infection   LASIK     NOSE SURGERY  2002   TONSILLECTOMY AND ADENOIDECTOMY  2002   Patient Active Problem List   Diagnosis Date Noted   Hypogonadism male 04/13/2021   Erectile dysfunction 10/08/2015   LBP (low back pain) 09/26/2014   Allergic rhinitis 09/28/2009   Benign essential hypertension 09/23/2009   Fam hx-ischem heart disease 09/05/2006   Hypotestosteronism 09/05/2006   Obstructive apnea 03/03/2006   Hyperglyceridemia, pure 09/13/2005   Acid reflux 04/06/2003    PCP: [name], MD  REFERRING PROVIDER: [name], MD  REFERRING DIAGNOSIS: M54.16 (ICD-10-CM) - Radiculopathy, lumbar region  THERAPY DIAG: Other low back pain  Pain in right hip  Muscle weakness (generalized)  RATIONALE FOR EVALUATION AND TREATMENT: Rehabilitation  ONSET DATE: episodic pain last 5 years, most recent flare-up around 09/03/2021  FOLLOW UP APPT WITH PROVIDER: Yes , follow up with MD in November    SUBJECTIVE HISTORY (EVAL):                                                                                                                                                                                          Chief Complaint: Patient is a 66 year old male with primary complaint of low back pain, referred for lumbar radiculopathy.   Pertinent History Patient is a 66 year old male with primary complaint of low back pain, referred for lumbar radiculopathy. Pt reports over 5 years of back problems. He states he was going to orthopedist - pt was referred to Dr.  Ronnald Ramp Social research officer, government) in [place], [place]. Pt has been getting steroid injections over last few years and this has helped "off and on." He reports having one in June that didn't help; he is having another one 12/22/21 at different spinal level. Most recent MRI showing ongoing bulging discs and degenerative changes.  Pt reports pain with playing golf. He gets stabbing pain in back with bending over. MD informed pt that if he gets surgery, it will include hardware/fusion. Pt reports difficulty getting up in AM usually. Pt reports intermittent disturbed sleep due to back hurting - pt reports this can get better with either lying flat on back or lying on side with knees to chest. Patient reports no sudden weight loss. No bowel/bladder changes. No personal cardiac Hx (pt does have 1st-degree family Hx). No history of cancers. Symptoms are variable. Pt reports not having pain when on the move and walking. Pt was informed of mild levoscoliosis per recent imaging.   Pain:  Pain Intensity: Present: 1/10, Best: /10, Worst: 10/10 Pain location: Low back, R hip, R flank; intermittently to R groin  Pain Quality: stabbing  Radiating: Yes , to R hip/groin  Numbness/Tingling: Yes, in hands, none in lower extremities  Focal Weakness: yes, losing strength in his hands Aggravating factors: symptoms are worst with bending over, weed eating positioning, swinging golf club, prolonged standing in one spot Relieving factors: propping legs up, pillow on his low  back, lying down in bed  24-hour pain behavior: worse in AM History of prior back injury, pain, surgery, or therapy: Yes, episodic back pain over last 5 years  Falls: Has patient fallen in last 6 months? No, Number of falls: N/A Follow-up appointment with MD: Yes, in November Imaging: Yes , MRI about 2 weeks ago; pt was informed that there was no major changes on imaging versus previous scans  MRI of lumbar spine from 06/2016 1. Central/right subarticular disc extrusion with inferior migration at L2-3 with resultant moderate right subarticular stenosis. 2. Moderate right lateral recess stenosis at L3-4 related to degenerative disc bulge and facet disease. 3. Left eccentric disc bulging with facet disease at L4-5, resulting in moderate left lateral recess and foraminal stenosis. 4. Small left foraminal disc protrusion at L5-S1, closely approximating the exiting left L5 nerve root. 5. Additional more mild multilevel degenerative spondylolysis as above. Please see above report for a full description of these findings.  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; yard work,, washing cars [employer] (bowel/bladder changes, saddle paresthesia, personal history of cancer, h/o spinal tumors, h/o compression fx, h/o abdominal aneurysm, abdominal pain, chills/fever, night sweats, nausea, vomiting, unrelenting pain, first onset of insidious LBP <20 y/o): Negative  Precautions: None  Weight Bearing Restrictions: No  Living Environment Lives with: lives with their family and lives with their spouse, both are retired Lives in: House/apartment   Patient Goals: Strengthen back    OBJECTIVE:   Patient [place] 56, predicted outcome score of 63   Gross Musculoskeletal Assessment Tremor: None Bulk: Normal Tone: Normal No visible step-off along spinal column, no apparent rib flare or gross asymmetry in  standing   GAIT: Mild dynamic varus in L knee with loading response   Posture: Lumbar lordosis: WNL Lumbar lateral shift: Negative Lower crossed syndrome (tight hip flexors and erector spinae; weak gluts and abs): Negative   AROM  AROM (Normal range in degrees) AROM  01/11/2022  Lumbar   Flexion (65) 100%  Extension (  30) 100%*  Right lateral flexion (25) 75%*  Left lateral flexion (25) 100%*  Right rotation (30) 100%*  Left rotation (30) 75%      Hip Right Left  Flexion (125) WNL WNL  Extension (15)    Abduction (40)    Adduction     Internal Rotation (45)    External Rotation (45) WNL WNL      (* = pain; Blank rows = not tested)   LE MMT:  MMT (out of 5) Right 01/11/2022 Left 01/11/2022  Hip flexion 5 5  Hip extension 4+ 4+  Hip abduction    Hip adduction    Hip internal rotation    Hip external rotation    Knee flexion 5 5  Knee extension 5 5  Ankle dorsiflexion 5 5  Ankle plantarflexion    Ankle inversion    Ankle eversion    (* = pain; Blank rows = not tested)   Sensation Grossly intact to light touch bilateral LEs as determined by testing dermatomes L2-S2, mild dec light touch sensation R great toe    Reflexes R/L Knee Jerk (L3/4): 2+/2+  Ankle Jerk (S1/2): 2+/1+  Clonus: R Negative, L Negative Homan's Sign: R Negative, L Negative   Muscle Length Hamstrings: R: Negative L: Negative Ely (quadriceps): R: Positive L: Positive Thomas (hip flexors): R: Not examined L: Not examined    Palpation  Location LEFT  RIGHT           Lumbar paraspinals 0 0  Quadratus Lumborum 0 0  Gluteus Maximus  1  Gluteus Medius  1  Deep hip external rotators  0  PSIS 0 0  Fortin's Area (SIJ)    Greater Trochanter  0  (Blank rows = not tested) Graded on 0-4 scale (0 = no pain, 1 = pain, 2 = pain with wincing/grimacing/flinching, 3 = pain with withdrawal, 4 = unwilling to allow palpation), (Blank rows = not tested)   Passive Accessory Intervertebral  Motion Pt denies reproduction of back pain with CPA L1-L5 and UPA bilaterally L1-L5. Generally, hypomobile along L3-S1 and along T7-T12 with CPA    SPECIAL TESTS Lumbar Radiculopathy and Discogenic: Centralization and Peripheralization (SN 92, -LR 0.12): Mild axial pain transiently with repeated extension, no apparent peripheralization/centralization Slump (SN 83, -LR 0.32): R: Negative L: Negative SLR (SN 92, -LR 0.29): R: Positive L:  Negative Crossed SLR (SP 90): R: Negative L: Negative  Facet Joint: Extension-Rotation (SN 100, -LR 0.0): R: Positive L: Negative  Lumbar Foraminal Stenosis: Lumbar quadrant (SN 70): R: Positive for mild R flank pain L: Negative  Hip: FABER (SN 81): R: Positive for pain in lateral hip L: Negative  Piriformis Syndrome: PACE Sign: Negative    TODAY'S TREATMENT    Subjective: Patient reports feeling good at arrival to PT. Patient reports stiffness upon waking. He reports doing better since Wednesday. Patient reports playing golf and yard work (push mowing, weed eating, leaf blowing). Patient reports compliance with his HEP. He denies pain this AM.    Manual Therapy - for symptom modulation, soft tissue sensitivity via lumbar spine nerve root decompression  *deferred today* Manual lumbar traction, bilateral long-leg distraction technique in supine; 10 sec on, 5 sec off; x 5 minutes for symptom modulation    Therapeutic Exercise:  Thoraco-Lumbar AROM: L rotation is min limited, R rotation normal, flexion normal, extension is normal; lateral flexion is normal and pain-free bilat   Nu-Step L4 for 6  minutes for thoracolumbar rotation mobility; seat at  6, arms at 9  Squats to chair, butt tap on standard-height chair ; 2x8 with 10# Medball held in front- PT cues for upright posture through ROM  Standing anti-rotation press at [employer]ilus: 20# isometric holds. 3x30 sec holds/side. Min multi modal cuing initially for spine positioning. Excellent  carryover after cues.    Side lying open books for thoracic mobility: 1x20/direction    Lower trunk rotations with calves on Silver physioball; 2x10 alternating R/L - verbal cueing for using core to pull lower extremities back to center      Prone hip extension with knees extended: 2x10. 4# AW at ankles. Min TC's for avoidance of trunk rotation.    Side lying hip abduction: 2x8. 5# AW. PT verbal cues to avoid hip flexion/TFL compensation.    PATIENT EDUCATION: Reviewed repeated movement program parameters and modified primary repeated movement to REIL (prone press-up).    *next visit* Hook lying single leg glut bridge with contralateral SLR: 2x8/LE.     *not today* Repeated extension in standing; 2x10  Repeated extension in lying; 2x10 Figure 4 hip ER stretch R LE: 30 sec x 3 (also added to HEP today) Lumbar trunk rotations: 1x20/direction. Good carryover from previous session Seated thoracic extension: pillow at low back to stabilize lumbar spine and small bolster at mid back as TC for thoracic extension. 2x12.      PATIENT EDUCATION:  Education details: form/technique with exercise. Person educated: Patient Education method: Explanation, Demonstration, and Handouts Education comprehension: verbalized understanding and returned demonstration   HOME EXERCISE PROGRAM: Access Code: [email or web address] URL: [email or web address] Date: 12/21/2021 Prepared by: Valentina Gu  Exercises - Prone Press Up - 5-6 x daily - 7 x weekly - 1 sets - 10 reps - 1sec hold - Sidelying Thoracic Rotation with Open Book  - 2 x daily - 7 x weekly - 2 sets - 10 reps - 2sec hold   ASSESSMENT:  CLINICAL IMPRESSION Patient had increase in pain this past Monday when leaning forward in a seated position after having minimal symptoms and participating in golf and all desired activities over the previous week. Patient has pain primary affecting R flank with Hx of referral to R groin - no  classic sciatic-type symptoms. Patient has no significant change with repeated extension in standing. Symptoms are modestly abated with repeated extension in lying and use of manual lumbar traction. Pt to continue with frequent repeated extension at home given apparent direction of preference; pt instructed on increasing frequency for repeated movement given limited frequency performed recently with HEP. Pt will continue to benefit from skilled PT services to progress mobility, decrease pain, and improve strength to return to PLOF.   REHAB POTENTIAL: Good  CLINICAL DECISION MAKING: Evolving/moderate complexity  EVALUATION COMPLEXITY: Moderate   GOALS:  SHORT TERM GOALS: Target date: 01/25/2022  Pt will be independent with HEP to improve strength and decrease back pain to improve pain-free function at home and work. Baseline: 12/21/21: Baseline HEP initiated Goal status: INITIAL   LONG TERM GOALS: Target date: 02/22/2022  Pt will increase FOTO to at least 63 to demonstrate significant improvement in function at home and work related to back pain  Baseline:  12/21/21: 56 Goal status: INITIAL  2.  Pt will decrease worst back pain by at least 2 points on the NPRS in order to demonstrate clinically significant reduction in back pain. Baseline: 12/21/21: 10/10 at worst.  Goal status: INITIAL  3.  Patient will have full thoracolumbar AROM without reproduction of symptoms as  needed for self-care ADLs, reaching, bending to retrieve low-lying items, household chores    Baseline: 12/21/21: Decreased motion with R lateral flexion and L rotation; pain reproduction with extension, bilateral lateral flexion, and R rotation.  Goal status: INITIAL  4.  Patient will complete shopping outing with his wife without increase in back pain > 1-2/10 as needed for participation in community activity with his spouse Baseline: 12/21/21: Pain with prolonged standing and community outings with spouse  Goal status:  INITIAL  5.  Patient will perform golf swing with full backswing and follow-through with sound power production and no increase in R flank pain or hip pain as needed for participation in golfing recreationally with friends Baseline: 12/21/21: Intermittent significant pain with golf swing Goal status: INITIAL    PLAN: PT FREQUENCY: 2x/week  PT DURATION: 6 weeks  PLANNED INTERVENTIONS: Therapeutic exercises, Therapeutic activity, Patient/Family education, Joint manipulation, Joint mobilization, Dry Needling, Electrical stimulation, Spinal mobilization, Cryotherapy, Moist heat, Traction, and Manual therapy  PLAN FOR NEXT SESSION: Continue with graded movement and progress hip strengthening and trunk stabilization as able.    Valentina Gu, PT, DPT #[id number]  Eilleen Kempf 01/11/2022, 9:31 AM

## 2022-01-13 ENCOUNTER — Ambulatory Visit: Payer: Medicare PPO | Admitting: Physical Therapy

## 2022-01-13 ENCOUNTER — Encounter: Payer: Self-pay | Admitting: Physical Therapy

## 2022-01-13 DIAGNOSIS — M5459 Other low back pain: Secondary | ICD-10-CM

## 2022-01-13 DIAGNOSIS — M25551 Pain in right hip: Secondary | ICD-10-CM

## 2022-01-13 DIAGNOSIS — M6281 Muscle weakness (generalized): Secondary | ICD-10-CM

## 2022-01-13 NOTE — Therapy (Signed)
OUTPATIENT PHYSICAL THERAPY TREATMENT   Patient Name: Tyler Roberts MRN: [id number] DOB:1956/01/24, 66 y.o., male Today's Date: 01/13/2022   PT End of Session - 01/13/22 0944     Visit Number 7    Number of Visits 13    Date for PT Re-Evaluation 02/01/22    Authorization Type [employer] 2023 - VL based on authorization    Authorization Time Period initial eval 12/21/21, auth [id number]    Authorization - Number of Visits 11    Progress Note Due on Visit 10    PT Start Time 0937    PT Stop Time 1017    PT Time Calculation (min) 40 min    Activity Tolerance No increased pain;Patient tolerated treatment well    Behavior During Therapy [facility] for tasks assessed/performed                Past Medical History:  Diagnosis Date   Actinic keratosis    Shingles 09/26/2014   Past Surgical History:  Procedure Laterality Date   HERNIA REPAIR  1990   KNEE SURGERY Right 2008   Due to a staph infection   LASIK     NOSE SURGERY  2002   TONSILLECTOMY AND ADENOIDECTOMY  2002   Patient Active Problem List   Diagnosis Date Noted   Hypogonadism male 04/13/2021   Erectile dysfunction 10/08/2015   LBP (low back pain) 09/26/2014   Allergic rhinitis 09/28/2009   Benign essential hypertension 09/23/2009   Fam hx-ischem heart disease 09/05/2006   Hypotestosteronism 09/05/2006   Obstructive apnea 03/03/2006   Hyperglyceridemia, pure 09/13/2005   Acid reflux 04/06/2003    PCP: Tyler Sons, MD  REFERRING PROVIDER: [name], MD  REFERRING DIAGNOSIS: M54.16 (ICD-10-CM) - Radiculopathy, lumbar region  THERAPY DIAG: Other low back pain  Pain in right hip  Muscle weakness (generalized)  RATIONALE FOR EVALUATION AND TREATMENT: Rehabilitation  ONSET DATE: episodic pain last 5 years, most recent flare-up around 09/03/2021  FOLLOW UP APPT WITH PROVIDER: Yes , follow up with MD in November    SUBJECTIVE HISTORY (EVAL):                                                                                                                                                                                          Chief Complaint: Patient is a 66 year old male with primary complaint of low back pain, referred for lumbar radiculopathy.   Pertinent History Patient is a 67 year old male with primary complaint of low back pain, referred for lumbar radiculopathy. Pt reports over 5 years of back problems. He states he was going to orthopedist - pt was referred to  Dr. Ronnald Roberts (neurosurgeon) in [place], [place]. Pt has been getting steroid injections over last few years and this has helped "off and on." He reports having one in June that didn't help; he is having another one 12/22/21 at different spinal level. Most recent MRI showing ongoing bulging discs and degenerative changes.  Pt reports pain with playing golf. He gets stabbing pain in back with bending over. MD informed pt that if he gets surgery, it will include hardware/fusion. Pt reports difficulty getting up in AM usually. Pt reports intermittent disturbed sleep due to back hurting - pt reports this can get better with either lying flat on back or lying on side with knees to chest. Patient reports no sudden weight loss. No bowel/bladder changes. No personal cardiac Hx (pt does have 1st-degree family Hx). No history of cancers. Symptoms are variable. Pt reports not having pain when on the move and walking. Pt was informed of mild levoscoliosis per recent imaging.   Pain:  Pain Intensity: Present: 1/10, Best: /10, Worst: 10/10 Pain location: Low back, R hip, R flank; intermittently to R groin  Pain Quality: stabbing  Radiating: Yes , to R hip/groin  Numbness/Tingling: Yes, in hands, none in lower extremities  Focal Weakness: yes, losing strength in his hands Aggravating factors: symptoms are worst with bending over, weed eating positioning, swinging golf club, prolonged standing in one spot Relieving factors: propping legs up, pillow on his  low back, lying down in bed  24-hour pain behavior: worse in AM History of prior back injury, pain, surgery, or therapy: Yes, episodic back pain over last 5 years  Falls: Has patient fallen in last 6 months? No, Number of falls: N/A Follow-up appointment with MD: Yes, in November Imaging: Yes , MRI about 2 weeks ago; pt was informed that there was no major changes on imaging versus previous scans  MRI of lumbar spine from 06/2016 1. Central/right subarticular disc extrusion with inferior migration at L2-3 with resultant moderate right subarticular stenosis. 2. Moderate right lateral recess stenosis at L3-4 related to degenerative disc bulge and facet disease. 3. Left eccentric disc bulging with facet disease at L4-5, resulting in moderate left lateral recess and foraminal stenosis. 4. Small left foraminal disc protrusion at L5-S1, closely approximating the exiting left L5 nerve root. 5. Additional more mild multilevel degenerative spondylolysis as above. Please see above report for a full description of these findings.  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; yard work,, washing cars [employer] (bowel/bladder changes, saddle paresthesia, personal history of cancer, h/o spinal tumors, h/o compression fx, h/o abdominal aneurysm, abdominal pain, chills/fever, night sweats, nausea, vomiting, unrelenting pain, first onset of insidious LBP <20 y/o): Negative  Precautions: None  Weight Bearing Restrictions: No  Living Environment Lives with: lives with their family and lives with their spouse, both are retired Lives in: House/apartment   Patient Goals: Strengthen back    OBJECTIVE:   Patient Tyler Roberts 56, predicted outcome score of 63   Gross Musculoskeletal Assessment Tremor: None Bulk: Normal Tone: Normal No visible step-off along spinal column, no apparent rib flare or gross asymmetry in  standing   GAIT: Mild dynamic varus in L knee with loading response   Posture: Lumbar lordosis: WNL Lumbar lateral shift: Negative Lower crossed syndrome (tight hip flexors and erector spinae; weak gluts and abs): Negative   AROM  AROM (Normal range in degrees) AROM  01/13/2022  Lumbar   Flexion (65) 100%  Extension (30) 100%*  Right lateral flexion (25) 75%*  Left lateral flexion (25) 100%*  Right rotation (30) 100%*  Left rotation (30) 75%      Hip Right Left  Flexion (125) WNL WNL  Extension (15)    Abduction (40)    Adduction     Internal Rotation (45)    External Rotation (45) WNL WNL      (* = pain; Blank rows = not tested)   LE MMT:  MMT (out of 5) Right 01/13/2022 Left 01/13/2022  Hip flexion 5 5  Hip extension 4+ 4+  Hip abduction    Hip adduction    Hip internal rotation    Hip external rotation    Knee flexion 5 5  Knee extension 5 5  Ankle dorsiflexion 5 5  Ankle plantarflexion    Ankle inversion    Ankle eversion    (* = pain; Blank rows = not tested)   Sensation Grossly intact to light touch bilateral LEs as determined by testing dermatomes L2-S2, mild dec light touch sensation R great toe    Reflexes R/L Knee Jerk (L3/4): 2+/2+  Ankle Jerk (S1/2): 2+/1+  Clonus: R Negative, L Negative Homan's Sign: R Negative, L Negative   Muscle Length Hamstrings: R: Negative L: Negative Ely (quadriceps): R: Positive L: Positive Thomas (hip flexors): R: Not examined L: Not examined    Palpation  Location LEFT  RIGHT           Lumbar paraspinals 0 0  Quadratus Lumborum 0 0  Gluteus Maximus  1  Gluteus Medius  1  Deep hip external rotators  0  PSIS 0 0  Fortin's Area (SIJ)    Greater Trochanter  0  (Blank rows = not tested) Graded on 0-4 scale (0 = no pain, 1 = pain, 2 = pain with wincing/grimacing/flinching, 3 = pain with withdrawal, 4 = unwilling to allow palpation), (Blank rows = not tested)   Passive Accessory Intervertebral  Motion Pt denies reproduction of back pain with CPA L1-L5 and UPA bilaterally L1-L5. Generally, hypomobile along L3-S1 and along T7-T12 with CPA    SPECIAL TESTS Lumbar Radiculopathy and Discogenic: Centralization and Peripheralization (SN 92, -LR 0.12): Mild axial pain transiently with repeated extension, no apparent peripheralization/centralization Slump (SN 83, -LR 0.32): R: Negative L: Negative SLR (SN 92, -LR 0.29): R: Positive L:  Negative Crossed SLR (SP 90): R: Negative L: Negative  Facet Joint: Extension-Rotation (SN 100, -LR 0.0): R: Positive L: Negative  Lumbar Foraminal Stenosis: Lumbar quadrant (SN 70): R: Positive for mild R flank pain L: Negative  Hip: FABER (SN 81): R: Positive for pain in lateral hip L: Negative  Piriformis Syndrome: PACE Sign: Negative    TODAY'S TREATMENT    Subjective: Patient reports doing well this week and denies significant aggravation of symptoms recently. Pt is compliant with his home exercise program including frequent repeated extension. Pt denies notable pain or significant soreness after last visit.    Manual Therapy - for symptom modulation, soft tissue sensitivity via lumbar spine nerve root decompression  *deferred today* Manual lumbar traction, bilateral long-leg distraction technique in supine; 10 sec on, 5 sec off; x 5 minutes for symptom modulation    Therapeutic Exercise:  Thoraco-Lumbar AROM: L rotation is min limited, R rotation normal, flexion normal, extension is normal; lateral flexion is normal and pain-free bilat   Nu-Step L4 for 6  minutes for thoracolumbar rotation mobility; seat at 6, arms at 9 - 3 minutes  not billed, subjective information gathered intermittently during this time  Side lying open books for thoracic mobility: 1x20/direction with Green Tband   Squats to chair, butt tap on standard-height chair ; 2x8 with 10# Medball held in front- PT cues for upright posture and positioning of feet   Lower  trunk rotations with calves on Silver physioball; 2x10 alternating R/L - verbal cueing for using core to pull lower extremities back to center   Silver physioball bridge; 2x10, 2 sec iso at top of bridge     Prone hip extension with knees extended: 2x10. 5# AW at ankles. Min TC's for avoidance of lumbar lordosis compensation.    Side lying hip abduction: 2x8. 5# AW. PT verbal cues to avoid hip flexion/TFL compensation.     *next visit* Standing anti-rotation press at [employer]ilus: 20# isometric holds. 3x30 sec holds/side. Min multi modal cuing initially for spine positioning. Excellent carryover after cues.     *not today* Hook lying single leg glut bridge with contralateral SLR: 2x8/LE. Repeated extension in standing; 2x10  Repeated extension in lying; 2x10 Figure 4 hip ER stretch R LE: 30 sec x 3 (also added to HEP today) Lumbar trunk rotations: 1x20/direction. Good carryover from previous session Seated thoracic extension: pillow at low back to stabilize lumbar spine and small bolster at mid back as TC for thoracic extension. 2x12.      PATIENT EDUCATION:  Education details: tactile cueing, verbal cueing, and demonstration to ascertain proper exercise technique as needed for optimal benefit. Person educated: Patient Education method: Explanation, Demonstration, and Handouts Education comprehension: verbalized understanding and returned demonstration   HOME EXERCISE PROGRAM: Access Code: [email or web address] URL: [email or web address] Date: 12/21/2021 Prepared by: Valentina Gu  Exercises - Prone Press Up - 5-6 x daily - 7 x weekly - 1 sets - 10 reps - 1sec hold - Sidelying Thoracic Rotation with Open Book  - 2 x daily - 7 x weekly - 2 sets - 10 reps - 2sec hold   ASSESSMENT:  CLINICAL IMPRESSION Patient has been able to fully resume active PT with focus on strengthening and building resiliency versus focus on symptom modulation. Pt tolerates additional physioball  drills and further work on posterior chain loading well without notable symptoms. Pt demonstrates grossly WFL thoracolumbar AROM without reproduction of pain. Pt will continue to benefit from skilled PT services to progress mobility, decrease pain, and improve strength to return to PLOF.   REHAB POTENTIAL: Good  CLINICAL DECISION MAKING: Evolving/moderate complexity  EVALUATION COMPLEXITY: Moderate   GOALS:  SHORT TERM GOALS: Target date: 01/27/2022  Pt will be independent with HEP to improve strength and decrease back pain to improve pain-free function at home and work. Baseline: 12/21/21: Baseline HEP initiated Goal status: INITIAL   LONG TERM GOALS: Target date: 02/24/2022  Pt will increase FOTO to at least 63 to demonstrate significant improvement in function at home and work related to back pain  Baseline:  12/21/21: 56 Goal status: INITIAL  2.  Pt will decrease worst back pain by at least 2 points on the NPRS in order to demonstrate clinically significant reduction in back pain. Baseline: 12/21/21: 10/10 at worst.  Goal status: INITIAL  3.  Patient will have full thoracolumbar AROM without reproduction of symptoms as needed for self-care ADLs, reaching, bending to retrieve low-lying items, household chores    Baseline: 12/21/21: Decreased motion with R lateral flexion and L rotation; pain reproduction with extension, bilateral lateral flexion, and R rotation.  Goal status: INITIAL  4.  Patient will complete shopping outing with his wife without increase in back pain > 1-2/10 as needed for participation in community activity with his spouse Baseline: 12/21/21: Pain with prolonged standing and community outings with spouse  Goal status: INITIAL  5.  Patient will perform golf swing with full backswing and follow-through with sound power production and no increase in R flank pain or hip pain as needed for participation in golfing recreationally with friends Baseline: 12/21/21:  Intermittent significant pain with golf swing Goal status: INITIAL    PLAN: PT FREQUENCY: 2x/week  PT DURATION: 6 weeks  PLANNED INTERVENTIONS: Therapeutic exercises, Therapeutic activity, Patient/Family education, Joint manipulation, Joint mobilization, Dry Needling, Electrical stimulation, Spinal mobilization, Cryotherapy, Moist heat, Traction, and Manual therapy  PLAN FOR NEXT SESSION: Continue with graded movement and progress hip strengthening and trunk stabilization as able.    Valentina Gu, PT, DPT #[id number]  Eilleen Kempf 01/13/2022, 9:45 AM

## 2022-01-18 ENCOUNTER — Encounter: Payer: Self-pay | Admitting: Physical Therapy

## 2022-01-18 ENCOUNTER — Ambulatory Visit: Payer: Medicare PPO | Admitting: Physical Therapy

## 2022-01-18 DIAGNOSIS — M5459 Other low back pain: Secondary | ICD-10-CM

## 2022-01-18 DIAGNOSIS — M25551 Pain in right hip: Secondary | ICD-10-CM

## 2022-01-18 DIAGNOSIS — M6281 Muscle weakness (generalized): Secondary | ICD-10-CM

## 2022-01-18 NOTE — Therapy (Signed)
OUTPATIENT PHYSICAL THERAPY TREATMENT   Patient Name: Tyler Roberts MRN: [id number] DOB:Jun 13, 1955, 66 y.o., male Today's Date: 01/18/2022   PT End of Session - 01/18/22 0935     Visit Number 8    Number of Visits 13    Date for PT Re-Evaluation 02/01/22    Authorization Type [employer] 2023 - VL based on authorization    Authorization Time Period initial eval 12/21/21, auth 9/18-10/25    Authorization - Number of Visits 11    Progress Note Due on Visit 10    PT Start Time 0933    PT Stop Time 1014    PT Time Calculation (min) 41 min    Activity Tolerance No increased pain;Patient tolerated treatment well    Behavior During Therapy [facility] for tasks assessed/performed                 Past Medical History:  Diagnosis Date   Actinic keratosis    Shingles 09/26/2014   Past Surgical History:  Procedure Laterality Date   HERNIA REPAIR  1990   KNEE SURGERY Right 2008   Due to a staph infection   LASIK     NOSE SURGERY  2002   TONSILLECTOMY AND ADENOIDECTOMY  2002   Patient Active Problem List   Diagnosis Date Noted   Hypogonadism male 04/13/2021   Erectile dysfunction 10/08/2015   LBP (low back pain) 09/26/2014   Allergic rhinitis 09/28/2009   Benign essential hypertension 09/23/2009   Fam hx-ischem heart disease 09/05/2006   Hypotestosteronism 09/05/2006   Obstructive apnea 03/03/2006   Hyperglyceridemia, pure 09/13/2005   Acid reflux 04/06/2003    PCP: [name], MD  REFERRING PROVIDER: [name], MD  REFERRING DIAGNOSIS: M54.16 (ICD-10-CM) - Radiculopathy, lumbar region  THERAPY DIAG: Other low back pain  Pain in right hip  Muscle weakness (generalized)  RATIONALE FOR EVALUATION AND TREATMENT: Rehabilitation  ONSET DATE: episodic pain last 5 years, most recent flare-up around 09/03/2021  FOLLOW UP APPT WITH PROVIDER: Yes , follow up with MD in November    SUBJECTIVE HISTORY (EVAL):                                                                                                                                                                                          Chief Complaint: Patient is a 66 year old male with primary complaint of low back pain, referred for lumbar radiculopathy.   Pertinent History Patient is a 66 year old male with primary complaint of low back pain, referred for lumbar radiculopathy. Pt reports over 5 years of back problems. He states he was going to orthopedist - pt was referred  to Dr. Ronnald Ramp (neurosurgeon) in [place], [place]. Pt has been getting steroid injections over last few years and this has helped "off and on." He reports having one in June that didn't help; he is having another one 12/22/21 at different spinal level. Most recent MRI showing ongoing bulging discs and degenerative changes.  Pt reports pain with playing golf. He gets stabbing pain in back with bending over. MD informed pt that if he gets surgery, it will include hardware/fusion. Pt reports difficulty getting up in AM usually. Pt reports intermittent disturbed sleep due to back hurting - pt reports this can get better with either lying flat on back or lying on side with knees to chest. Patient reports no sudden weight loss. No bowel/bladder changes. No personal cardiac Hx (pt does have 1st-degree family Hx). No history of cancers. Symptoms are variable. Pt reports not having pain when on the move and walking. Pt was informed of mild levoscoliosis per recent imaging.   Pain:  Pain Intensity: Present: 1/10, Best: /10, Worst: 10/10 Pain location: Low back, R hip, R flank; intermittently to R groin  Pain Quality: stabbing  Radiating: Yes , to R hip/groin  Numbness/Tingling: Yes, in hands, none in lower extremities  Focal Weakness: yes, losing strength in his hands Aggravating factors: symptoms are worst with bending over, weed eating positioning, swinging golf club, prolonged standing in one spot Relieving factors: propping legs up, pillow on his  low back, lying down in bed  24-hour pain behavior: worse in AM History of prior back injury, pain, surgery, or therapy: Yes, episodic back pain over last 5 years  Falls: Has patient fallen in last 6 months? No, Number of falls: N/A Follow-up appointment with MD: Yes, in November Imaging: Yes , MRI about 2 weeks ago; pt was informed that there was no major changes on imaging versus previous scans  MRI of lumbar spine from 06/2016 1. Central/right subarticular disc extrusion with inferior migration at L2-3 with resultant moderate right subarticular stenosis. 2. Moderate right lateral recess stenosis at L3-4 related to degenerative disc bulge and facet disease. 3. Left eccentric disc bulging with facet disease at L4-5, resulting in moderate left lateral recess and foraminal stenosis. 4. Small left foraminal disc protrusion at L5-S1, closely approximating the exiting left L5 nerve root. 5. Additional more mild multilevel degenerative spondylolysis as above. Please see above report for a full description of these findings.  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; yard work,, washing cars [employer] (bowel/bladder changes, saddle paresthesia, personal history of cancer, h/o spinal tumors, h/o compression fx, h/o abdominal aneurysm, abdominal pain, chills/fever, night sweats, nausea, vomiting, unrelenting pain, first onset of insidious LBP <20 y/o): Negative  Precautions: None  Weight Bearing Restrictions: No  Living Environment Lives with: lives with their family and lives with their spouse, both are retired Lives in: House/apartment   Patient Goals: Strengthen back    OBJECTIVE:   Patient [place] 56, predicted outcome score of 63   Gross Musculoskeletal Assessment Tremor: None Bulk: Normal Tone: Normal No visible step-off along spinal column, no apparent rib flare or gross asymmetry in  standing   GAIT: Mild dynamic varus in L knee with loading response   Posture: Lumbar lordosis: WNL Lumbar lateral shift: Negative Lower crossed syndrome (tight hip flexors and erector spinae; weak gluts and abs): Negative   AROM  AROM (Normal range in degrees) AROM  01/18/2022  Lumbar   Flexion (65) 100%  Extension (30) 100%*  Right lateral flexion (25) 75%*  Left lateral flexion (25) 100%*  Right rotation (30) 100%*  Left rotation (30) 75%      Hip Right Left  Flexion (125) WNL WNL  Extension (15)    Abduction (40)    Adduction     Internal Rotation (45)    External Rotation (45) WNL WNL      (* = pain; Blank rows = not tested)   LE MMT:  MMT (out of 5) Right 01/18/2022 Left 01/18/2022  Hip flexion 5 5  Hip extension 4+ 4+  Hip abduction    Hip adduction    Hip internal rotation    Hip external rotation    Knee flexion 5 5  Knee extension 5 5  Ankle dorsiflexion 5 5  Ankle plantarflexion    Ankle inversion    Ankle eversion    (* = pain; Blank rows = not tested)   Sensation Grossly intact to light touch bilateral LEs as determined by testing dermatomes L2-S2, mild dec light touch sensation R great toe    Reflexes R/L Knee Jerk (L3/4): 2+/2+  Ankle Jerk (S1/2): 2+/1+  Clonus: R Negative, L Negative Homan's Sign: R Negative, L Negative   Muscle Length Hamstrings: R: Negative L: Negative Ely (quadriceps): R: Positive L: Positive Thomas (hip flexors): R: Not examined L: Not examined    Palpation  Location LEFT  RIGHT           Lumbar paraspinals 0 0  Quadratus Lumborum 0 0  Gluteus Maximus  1  Gluteus Medius  1  Deep hip external rotators  0  PSIS 0 0  Fortin's Area (SIJ)    Greater Trochanter  0  (Blank rows = not tested) Graded on 0-4 scale (0 = no pain, 1 = pain, 2 = pain with wincing/grimacing/flinching, 3 = pain with withdrawal, 4 = unwilling to allow palpation), (Blank rows = not tested)   Passive Accessory Intervertebral  Motion Pt denies reproduction of back pain with CPA L1-L5 and UPA bilaterally L1-L5. Generally, hypomobile along L3-S1 and along T7-T12 with CPA    SPECIAL TESTS Lumbar Radiculopathy and Discogenic: Centralization and Peripheralization (SN 92, -LR 0.12): Mild axial pain transiently with repeated extension, no apparent peripheralization/centralization Slump (SN 83, -LR 0.32): R: Negative L: Negative SLR (SN 92, -LR 0.29): R: Positive L:  Negative Crossed SLR (SP 90): R: Negative L: Negative  Facet Joint: Extension-Rotation (SN 100, -LR 0.0): R: Positive L: Negative  Lumbar Foraminal Stenosis: Lumbar quadrant (SN 70): R: Positive for mild R flank pain L: Negative  Hip: FABER (SN 81): R: Positive for pain in lateral hip L: Negative  Piriformis Syndrome: PACE Sign: Negative    TODAY'S TREATMENT    Subjective: Patient reports some AM stiffness and tightness with R trunk rotation. Pt reports no significant pain this AM. Pt is compliant with HEP.    Manual Therapy - for symptom modulation, soft tissue sensitivity via lumbar spine nerve root decompression  *deferred today* Manual lumbar traction, bilateral long-leg distraction technique in supine; 10 sec on, 5 sec off; x 5 minutes for symptom modulation    Therapeutic Exercise:  Thoraco-Lumbar AROM: L rotation is min limited, R rotation normal, flexion normal, extension is normal; lateral flexion is normal and pain-free bilat   Nu-Step L6 for 5  minutes for thoracolumbar rotation mobility; seat at 8, arms at 9 - 3 minutes not billed, subjective information gathered intermittently during this time  Side lying  open books for thoracic mobility: 1x20/direction with Green Tband   Squats to chair, butt tap on standard-height chair ; 2x8 with 10# Medball held in front- PT cues for upright posture and positioning of feet   Lower trunk rotations with calves on Silver physioball; 2x10 alternating R/L - verbal cueing for using core to  pull lower extremities back to center   Silver physioball bridge; 2x6, 5 sec iso at top of bridge    Bird dog; 2x5, quadruped    Prone hip extension with knees extended: 2x10. 5# AW at ankles. Min TC's for avoidance of lumbar lordosis compensation.    Side lying hip abduction: 2x10. 5# AW. PT verbal cues to avoid hip flexion/TFL compensation.   Standing anti-rotation press at Nautilus: 40# isometric holds. 2x10, 1 sec hold per side. Demonstration initially, with minimal cueing required for technique during exercise performance.     *not today* Hook lying single leg glut bridge with contralateral SLR: 2x8/LE. Repeated extension in standing; 2x10  Repeated extension in lying; 2x10 Figure 4 hip ER stretch R LE: 30 sec x 3 (also added to HEP today) Lumbar trunk rotations: 1x20/direction. Good carryover from previous session Seated thoracic extension: pillow at low back to stabilize lumbar spine and small bolster at mid back as TC for thoracic extension. 2x12.      PATIENT EDUCATION:  Education details: tactile cueing, verbal cueing, and demonstration to ascertain proper exercise technique as needed for optimal benefit. Person educated: Patient Education method: Explanation, Demonstration, and Handouts Education comprehension: verbalized understanding and returned demonstration   HOME EXERCISE PROGRAM: Access Code: [email or web address] URL: [email or web address] Date: 01/18/2022 Prepared by: Valentina Gu  Exercises - Prone Press Up  - 5-6 x daily - 7 x weekly - 1 sets - 10 reps - 1sec hold - Sidelying Thoracic Rotation with Open Book  - 2 x daily - 7 x weekly - 2 sets - 10 reps - 2sec hold - Prone Hip Extension  - 1 x daily - 4 x weekly - 2 sets - 10 reps - Sidelying Hip Abduction  - 1 x daily - 4 x weekly - 2 sets - 10 reps - Bird Dog  - 1 x daily - 4 x weekly - 2 sets - 10 reps   ASSESSMENT:  CLINICAL IMPRESSION Patient has been able to fully resume active PT with  focus on strengthening and building resiliency versus focus on symptom modulation. Pt tolerates additional physioball drills and further work on posterior chain loading well without notable symptoms. Pt demonstrates grossly WFL thoracolumbar AROM without reproduction of pain. Pt will continue to benefit from skilled PT services to progress mobility, decrease pain, and improve strength to return to PLOF.   REHAB POTENTIAL: Good  CLINICAL DECISION MAKING: Evolving/moderate complexity  EVALUATION COMPLEXITY: Moderate   GOALS:  SHORT TERM GOALS: Target date: 02/01/2022  Pt will be independent with HEP to improve strength and decrease back pain to improve pain-free function at home and work. Baseline: 12/21/21: Baseline HEP initiated Goal status: INITIAL   LONG TERM GOALS: Target date: 03/01/2022  Pt will increase FOTO to at least 63 to demonstrate significant improvement in function at home and work related to back pain  Baseline:  12/21/21: 56 Goal status: INITIAL  2.  Pt will decrease worst back pain by at least 2 points on the NPRS in order to demonstrate clinically significant reduction in back pain. Baseline: 12/21/21: 10/10 at worst.  Goal status: INITIAL  3.  Patient will have full  thoracolumbar AROM without reproduction of symptoms as needed for self-care ADLs, reaching, bending to retrieve low-lying items, household chores    Baseline: 12/21/21: Decreased motion with R lateral flexion and L rotation; pain reproduction with extension, bilateral lateral flexion, and R rotation.  Goal status: INITIAL  4.  Patient will complete shopping outing with his wife without increase in back pain > 1-2/10 as needed for participation in community activity with his spouse Baseline: 12/21/21: Pain with prolonged standing and community outings with spouse  Goal status: INITIAL  5.  Patient will perform golf swing with full backswing and follow-through with sound power production and no increase in  R flank pain or hip pain as needed for participation in golfing recreationally with friends Baseline: 12/21/21: Intermittent significant pain with golf swing Goal status: INITIAL    PLAN: PT FREQUENCY: 2x/week  PT DURATION: 6 weeks  PLANNED INTERVENTIONS: Therapeutic exercises, Therapeutic activity, Patient/Family education, Joint manipulation, Joint mobilization, Dry Needling, Electrical stimulation, Spinal mobilization, Cryotherapy, Moist heat, Traction, and Manual therapy  PLAN FOR NEXT SESSION: Continue with graded movement and progress hip strengthening and trunk stabilization as able.    Valentina Gu, PT, DPT #[id number]  Eilleen Kempf 01/18/2022, 9:36 AM

## 2022-01-20 ENCOUNTER — Encounter: Payer: Self-pay | Admitting: Physical Therapy

## 2022-01-20 ENCOUNTER — Ambulatory Visit: Payer: Medicare PPO | Admitting: Physical Therapy

## 2022-01-20 DIAGNOSIS — M5459 Other low back pain: Secondary | ICD-10-CM | POA: Diagnosis not present

## 2022-01-20 DIAGNOSIS — M25551 Pain in right hip: Secondary | ICD-10-CM

## 2022-01-20 DIAGNOSIS — M6281 Muscle weakness (generalized): Secondary | ICD-10-CM

## 2022-01-20 NOTE — Therapy (Signed)
OUTPATIENT PHYSICAL THERAPY TREATMENT   Patient Name: Tyler Roberts MRN: [id number] DOB:1955/07/11, 66 y.o., male Today's Date: 01/20/2022   PT End of Session - 01/20/22 0940     Visit Number 9    Number of Visits 13    Date for PT Re-Evaluation 02/01/22    Authorization Type [employer] 2023 - VL based on authorization    Authorization Time Period initial eval 12/21/21, auth [id number]    Authorization - Number of Visits 11    Progress Note Due on Visit 10    PT Start Time 0933    PT Stop Time 1014    PT Time Calculation (min) 41 min    Activity Tolerance No increased pain;Patient tolerated treatment well    Behavior During Therapy [facility] for tasks assessed/performed              Past Medical History:  Diagnosis Date   Actinic keratosis    Shingles 09/26/2014   Past Surgical History:  Procedure Laterality Date   HERNIA REPAIR  1990   KNEE SURGERY Right 2008   Due to a staph infection   LASIK     NOSE SURGERY  2002   TONSILLECTOMY AND ADENOIDECTOMY  2002   Patient Active Problem List   Diagnosis Date Noted   Hypogonadism male 04/13/2021   Erectile dysfunction 10/08/2015   LBP (low back pain) 09/26/2014   Allergic rhinitis 09/28/2009   Benign essential hypertension 09/23/2009   Fam hx-ischem heart disease 09/05/2006   Hypotestosteronism 09/05/2006   Obstructive apnea 03/03/2006   Hyperglyceridemia, pure 09/13/2005   Acid reflux 04/06/2003    PCP: Tyler Sons, MD  REFERRING PROVIDER: [name], MD  REFERRING DIAGNOSIS: M54.16 (ICD-10-CM) - Radiculopathy, lumbar region  THERAPY DIAG: Other low back pain  Pain in right hip  Muscle weakness (generalized)  RATIONALE FOR EVALUATION AND TREATMENT: Rehabilitation  ONSET DATE: episodic pain last 5 years, most recent flare-up around 09/03/2021  FOLLOW UP APPT WITH PROVIDER: Yes , follow up with MD in November    SUBJECTIVE HISTORY (EVAL):                                                                                                                                                                                          Chief Complaint: Patient is a 66 year old male with primary complaint of low back pain, referred for lumbar radiculopathy.   Pertinent History Patient is a 66 year old male with primary complaint of low back pain, referred for lumbar radiculopathy. Pt reports over 5 years of back problems. He states he was going to orthopedist - pt was referred to Dr. Ronnald Roberts (  neurosurgeon) in [place], [place]. Pt has been getting steroid injections over last few years and this has helped "off and on." He reports having one in June that didn't help; he is having another one 12/22/21 at different spinal level. Most recent MRI showing ongoing bulging discs and degenerative changes.  Pt reports pain with playing golf. He gets stabbing pain in back with bending over. MD informed pt that if he gets surgery, it will include hardware/fusion. Pt reports difficulty getting up in AM usually. Pt reports intermittent disturbed sleep due to back hurting - pt reports this can get better with either lying flat on back or lying on side with knees to chest. Patient reports no sudden weight loss. No bowel/bladder changes. No personal cardiac Hx (pt does have 1st-degree family Hx). No history of cancers. Symptoms are variable. Pt reports not having pain when on the move and walking. Pt was informed of mild levoscoliosis per recent imaging.   Pain:  Pain Intensity: Present: 1/10, Best: /10, Worst: 10/10 Pain location: Low back, R hip, R flank; intermittently to R groin  Pain Quality: stabbing  Radiating: Yes , to R hip/groin  Numbness/Tingling: Yes, in hands, none in lower extremities  Focal Weakness: yes, losing strength in his hands Aggravating factors: symptoms are worst with bending over, weed eating positioning, swinging golf club, prolonged standing in one spot Relieving factors: propping legs up, pillow on his low  back, lying down in bed  24-hour pain behavior: worse in AM History of prior back injury, pain, surgery, or therapy: Yes, episodic back pain over last 5 years  Falls: Has patient fallen in last 6 months? No, Number of falls: N/A Follow-up appointment with MD: Yes, in November Imaging: Yes , MRI about 2 weeks ago; pt was informed that there was no major changes on imaging versus previous scans  MRI of lumbar spine from 06/2016 1. Central/right subarticular disc extrusion with inferior migration at L2-3 with resultant moderate right subarticular stenosis. 2. Moderate right lateral recess stenosis at L3-4 related to degenerative disc bulge and facet disease. 3. Left eccentric disc bulging with facet disease at L4-5, resulting in moderate left lateral recess and foraminal stenosis. 4. Small left foraminal disc protrusion at L5-S1, closely approximating the exiting left L5 nerve root. 5. Additional more mild multilevel degenerative spondylolysis as above. Please see above report for a full description of these findings.  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; yard work,, washing cars [employer] (bowel/bladder changes, saddle paresthesia, personal history of cancer, h/o spinal tumors, h/o compression fx, h/o abdominal aneurysm, abdominal pain, chills/fever, night sweats, nausea, vomiting, unrelenting pain, first onset of insidious LBP <20 y/o): Negative  Precautions: None  Weight Bearing Restrictions: No  Living Environment Lives with: lives with their family and lives with their spouse, both are retired Lives in: House/apartment   Patient Goals: Strengthen back    OBJECTIVE:   Patient Tyler Roberts 56, predicted outcome score of 63   Gross Musculoskeletal Assessment Tremor: None Bulk: Normal Tone: Normal No visible step-off along spinal column, no apparent rib flare or gross asymmetry in  standing   GAIT: Mild dynamic varus in L knee with loading response   Posture: Lumbar lordosis: WNL Lumbar lateral shift: Negative Lower crossed syndrome (tight hip flexors and erector spinae; weak gluts and abs): Negative   AROM  AROM (Normal range in degrees) AROM  01/20/2022  Lumbar   Flexion (65) 100%  Extension (30)  100%*  Right lateral flexion (25) 75%*  Left lateral flexion (25) 100%*  Right rotation (30) 100%*  Left rotation (30) 75%      Hip Right Left  Flexion (125) WNL WNL  Extension (15)    Abduction (40)    Adduction     Internal Rotation (45)    External Rotation (45) WNL WNL      (* = pain; Blank rows = not tested)   LE MMT:  MMT (out of 5) Right 01/20/2022 Left 01/20/2022  Hip flexion 5 5  Hip extension 4+ 4+  Hip abduction    Hip adduction    Hip internal rotation    Hip external rotation    Knee flexion 5 5  Knee extension 5 5  Ankle dorsiflexion 5 5  Ankle plantarflexion    Ankle inversion    Ankle eversion    (* = pain; Blank rows = not tested)   Sensation Grossly intact to light touch bilateral LEs as determined by testing dermatomes L2-S2, mild dec light touch sensation R great toe    Reflexes R/L Knee Jerk (L3/4): 2+/2+  Ankle Jerk (S1/2): 2+/1+  Clonus: R Negative, L Negative Homan's Sign: R Negative, L Negative   Muscle Length Hamstrings: R: Negative L: Negative Ely (quadriceps): R: Positive L: Positive Thomas (hip flexors): R: Not examined L: Not examined    Palpation  Location LEFT  RIGHT           Lumbar paraspinals 0 0  Quadratus Lumborum 0 0  Gluteus Maximus  1  Gluteus Medius  1  Deep hip external rotators  0  PSIS 0 0  Fortin's Area (SIJ)    Greater Trochanter  0  (Blank rows = not tested) Graded on 0-4 scale (0 = no pain, 1 = pain, 2 = pain with wincing/grimacing/flinching, 3 = pain with withdrawal, 4 = unwilling to allow palpation), (Blank rows = not tested)   Passive Accessory Intervertebral  Motion Pt denies reproduction of back pain with CPA L1-L5 and UPA bilaterally L1-L5. Generally, hypomobile along L3-S1 and along T7-T12 with CPA    SPECIAL TESTS Lumbar Radiculopathy and Discogenic: Centralization and Peripheralization (SN 92, -LR 0.12): Mild axial pain transiently with repeated extension, no apparent peripheralization/centralization Slump (SN 83, -LR 0.32): R: Negative L: Negative SLR (SN 92, -LR 0.29): R: Positive L:  Negative Crossed SLR (SP 90): R: Negative L: Negative  Facet Joint: Extension-Rotation (SN 100, -LR 0.0): R: Positive L: Negative  Lumbar Foraminal Stenosis: Lumbar quadrant (SN 70): R: Positive for mild R flank pain L: Negative  Hip: FABER (SN 81): R: Positive for pain in lateral hip L: Negative  Piriformis Syndrome: PACE Sign: Negative    TODAY'S TREATMENT    Subjective: Patient reports some AM stiffness and "feeling it" in R flank, but he denies notable pain at arrival. He reports some soreness late Monday. He denies recent flare-up of symptoms. Pt is compliant with his HEP.    Manual Therapy - for symptom modulation, soft tissue sensitivity via lumbar spine nerve root decompression  *deferred today* Manual lumbar traction, bilateral long-leg distraction technique in supine; 10 sec on, 5 sec off; x 5 minutes for symptom modulation    Therapeutic Exercise:  Nu-Step L6 for 5  minutes for thoracolumbar rotation mobility; seat at 8, arms at 10 - 3 minutes not billed, subjective information gathered intermittently during this time  Side lying open books for thoracic mobility: 1x20/direction with Green Tband   Lower trunk  rotations with QL bias (LE in figure-4 position); x10 with each LE crossed  Silver physioball bridge; 2x8, 5 sec iso at top of bridge  Squats to chair, butt tap on standard-height chair ; 2x8 with 10# Medball held in front- PT cues for upright posture and positioning of feet  Standing anti-rotation press at Nautilus:  40#; 2x10, 1 sec hold per side. Demonstration initially, with minimal cueing required for technique during exercise performance.   Pallof press walkout; 30#, x10 for R stepping and L stepping (2 steps)   PATIENT EDUCATION: HEP update and review     *not today*   Bird dog; 2x5, quadruped   Prone hip extension with knees extended: 2x10. 5# AW at ankles. Min TC's for avoidance of lumbar lordosis compensation.   Side lying hip abduction: 2x10. 5# AW. PT verbal cues to avoid hip flexion/TFL compensation.  Lower trunk rotations with calves on Silver physioball; 2x10 alternating R/L - verbal cueing for using core to pull lower extremities back to center  [facility] lying single leg glut bridge with contralateral SLR: 2x8/LE. Repeated extension in standing; 2x10  Repeated extension in lying; 2x10 Figure 4 hip ER stretch R LE: 30 sec x 3 (also added to HEP today) Lumbar trunk rotations: 1x20/direction. Good carryover from previous session Seated thoracic extension: pillow at low back to stabilize lumbar spine and small bolster at mid back as TC for thoracic extension. 2x12.      PATIENT EDUCATION:  Education details: tactile cueing, verbal cueing, and demonstration to ascertain proper exercise technique as needed for optimal benefit. Person educated: Patient Education method: Explanation, Demonstration, and Handouts Education comprehension: verbalized understanding and returned demonstration   HOME EXERCISE PROGRAM: Access Code: [email or web address] URL: [email or web address] Date: 01/18/2022 Prepared by: Valentina Gu  Exercises - Prone Press Up  - 5-6 x daily - 7 x weekly - 1 sets - 10 reps - 1sec hold - Sidelying Thoracic Rotation with Open Book  - 2 x daily - 7 x weekly - 2 sets - 10 reps - 2sec hold - Prone Hip Extension  - 1 x daily - 4 x weekly - 2 sets - 10 reps - Sidelying Hip Abduction  - 1 x daily - 4 x weekly - 2 sets - 10 reps - Bird Dog  - 1 x daily - 4 x weekly - 2 sets -  10 reps   ASSESSMENT:  CLINICAL IMPRESSION Patient is making excellent progress to date with current extension program, and he has minimal pain at arrival to clinic over the previous week. Continued trunk stabilization/core strengthening with increased emphasis on upright activity today with no notable pain. Pt will continue to benefit from skilled PT services to progress mobility, decrease pain, and improve strength to return to PLOF.   REHAB POTENTIAL: Good  CLINICAL DECISION MAKING: Evolving/moderate complexity  EVALUATION COMPLEXITY: Moderate   GOALS:  SHORT TERM GOALS: Target date: 02/03/2022  Pt will be independent with HEP to improve strength and decrease back pain to improve pain-free function at home and work. Baseline: 12/21/21: Baseline HEP initiated Goal status: INITIAL   LONG TERM GOALS: Target date: 03/03/2022  Pt will increase FOTO to at least 63 to demonstrate significant improvement in function at home and work related to back pain  Baseline:  12/21/21: 56 Goal status: INITIAL  2.  Pt will decrease worst back pain by at least 2 points on the NPRS in order to demonstrate clinically significant reduction in back pain. Baseline: 12/21/21: 10/10 at worst.  Goal status: INITIAL  3.  Patient will have full thoracolumbar AROM without reproduction of symptoms as needed for self-care ADLs, reaching, bending to retrieve low-lying items, household chores    Baseline: 12/21/21: Decreased motion with R lateral flexion and L rotation; pain reproduction with extension, bilateral lateral flexion, and R rotation.  Goal status: INITIAL  4.  Patient will complete shopping outing with his wife without increase in back pain > 1-2/10 as needed for participation in community activity with his spouse Baseline: 12/21/21: Pain with prolonged standing and community outings with spouse  Goal status: INITIAL  5.  Patient will perform golf swing with full backswing and follow-through with  sound power production and no increase in R flank pain or hip pain as needed for participation in golfing recreationally with friends Baseline: 12/21/21: Intermittent significant pain with golf swing Goal status: INITIAL    PLAN: PT FREQUENCY: 2x/week  PT DURATION: 6 weeks  PLANNED INTERVENTIONS: Therapeutic exercises, Therapeutic activity, Patient/Family education, Joint manipulation, Joint mobilization, Dry Needling, Electrical stimulation, Spinal mobilization, Cryotherapy, Moist heat, Traction, and Manual therapy  PLAN FOR NEXT SESSION: Continue with graded movement and progress hip strengthening and trunk stabilization as able. Re-assessment next visit.   Valentina Gu, PT, DPT #[id number]  Eilleen Kempf 01/20/2022, 9:40 AM

## 2022-01-25 ENCOUNTER — Other Ambulatory Visit: Payer: Self-pay | Admitting: Family Medicine

## 2022-01-25 ENCOUNTER — Encounter: Payer: Self-pay | Admitting: Physical Therapy

## 2022-01-25 ENCOUNTER — Ambulatory Visit: Payer: Medicare PPO | Admitting: Physical Therapy

## 2022-01-25 DIAGNOSIS — I10 Essential (primary) hypertension: Secondary | ICD-10-CM

## 2022-01-25 DIAGNOSIS — M5459 Other low back pain: Secondary | ICD-10-CM

## 2022-01-25 DIAGNOSIS — M6281 Muscle weakness (generalized): Secondary | ICD-10-CM

## 2022-01-25 DIAGNOSIS — M25551 Pain in right hip: Secondary | ICD-10-CM

## 2022-01-25 NOTE — Therapy (Signed)
OUTPATIENT PHYSICAL THERAPY TREATMENT AND PROGRESS NOTE   Dates of reporting period  12/21/21   to   01/25/22    Patient Name: Tyler Roberts MRN: [id number] DOB:11-Apr-1955, 66 y.o., male Today's Date: 01/25/2022   PT End of Session - 01/25/22 0925     Visit Number 10    Number of Visits 13    Date for PT Re-Evaluation 02/01/22    Authorization Type [employer] 2023 - VL based on authorization    Authorization Time Period initial eval 12/21/21, auth [id number]    Authorization - Number of Visits 11    Progress Note Due on Visit 10    PT Start Time 0927    PT Stop Time 1010    PT Time Calculation (min) 43 min    Activity Tolerance No increased pain;Patient tolerated treatment well    Behavior During Therapy [facility] for tasks assessed/performed               Past Medical History:  Diagnosis Date   Actinic keratosis    Shingles 09/26/2014   Past Surgical History:  Procedure Laterality Date   HERNIA REPAIR  1990   KNEE SURGERY Right 2008   Due to a staph infection   LASIK     NOSE SURGERY  2002   TONSILLECTOMY AND ADENOIDECTOMY  2002   Patient Active Problem List   Diagnosis Date Noted   Hypogonadism male 04/13/2021   Erectile dysfunction 10/08/2015   LBP (low back pain) 09/26/2014   Allergic rhinitis 09/28/2009   Benign essential hypertension 09/23/2009   Fam hx-ischem heart disease 09/05/2006   Hypotestosteronism 09/05/2006   Obstructive apnea 03/03/2006   Hyperglyceridemia, pure 09/13/2005   Acid reflux 04/06/2003    PCP: Tyler Sons, MD  REFERRING PROVIDER: [name], MD  REFERRING DIAGNOSIS: M54.16 (ICD-10-CM) - Radiculopathy, lumbar region  THERAPY DIAG: Other low back pain  Pain in right hip  Muscle weakness (generalized)  RATIONALE FOR EVALUATION AND TREATMENT: Rehabilitation  ONSET DATE: episodic pain last 5 years, most recent flare-up around 09/03/2021  FOLLOW UP APPT WITH PROVIDER: Yes , follow up with MD in November    SUBJECTIVE  HISTORY (EVAL):                                                                                                                                                                                         Chief Complaint: Patient is a 66 year old male with primary complaint of low back pain, referred for lumbar radiculopathy.   Pertinent History Patient is a 66 year old male with primary complaint of low back pain, referred for lumbar radiculopathy. Pt reports over  5 years of back problems. He states he was going to orthopedist - pt was referred to Dr. Ronnald Roberts (neurosurgeon) in [place], [place]. Pt has been getting steroid injections over last few years and this has helped "off and on." He reports having one in June that didn't help; he is having another one 12/22/21 at different spinal level. Most recent MRI showing ongoing bulging discs and degenerative changes.  Pt reports pain with playing golf. He gets stabbing pain in back with bending over. MD informed pt that if he gets surgery, it will include hardware/fusion. Pt reports difficulty getting up in AM usually. Pt reports intermittent disturbed sleep due to back hurting - pt reports this can get better with either lying flat on back or lying on side with knees to chest. Patient reports no sudden weight loss. No bowel/bladder changes. No personal cardiac Hx (pt does have 1st-degree family Hx). No history of cancers. Symptoms are variable. Pt reports not having pain when on the move and walking. Pt was informed of mild levoscoliosis per recent imaging.   Pain:  Pain Intensity: Present: 1/10, Best: /10, Worst: 10/10 Pain location: Low back, R hip, R flank; intermittently to R groin  Pain Quality: stabbing  Radiating: Yes , to R hip/groin  Numbness/Tingling: Yes, in hands, none in lower extremities  Focal Weakness: yes, losing strength in his hands Aggravating factors: symptoms are worst with bending over, weed eating positioning, swinging golf club, prolonged  standing in one spot Relieving factors: propping legs up, pillow on his low back, lying down in bed  24-hour pain behavior: worse in AM History of prior back injury, pain, surgery, or therapy: Yes, episodic back pain over last 5 years  Falls: Has patient fallen in last 6 months? No, Number of falls: N/A Follow-up appointment with MD: Yes, in November Imaging: Yes , MRI about 2 weeks ago; pt was informed that there was no major changes on imaging versus previous scans  MRI of lumbar spine from 06/2016 1. Central/right subarticular disc extrusion with inferior migration at L2-3 with resultant moderate right subarticular stenosis. 2. Moderate right lateral recess stenosis at L3-4 related to degenerative disc bulge and facet disease. 3. Left eccentric disc bulging with facet disease at L4-5, resulting in moderate left lateral recess and foraminal stenosis. 4. Small left foraminal disc protrusion at L5-S1, closely approximating the exiting left L5 nerve root. 5. Additional more mild multilevel degenerative spondylolysis as above. Please see above report for a full description of these findings.  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; yard work,, washing cars [employer] (bowel/bladder changes, saddle paresthesia, personal history of cancer, h/o spinal tumors, h/o compression fx, h/o abdominal aneurysm, abdominal pain, chills/fever, night sweats, nausea, vomiting, unrelenting pain, first onset of insidious LBP <20 y/o): Negative  Precautions: None  Weight Bearing Restrictions: No  Living Environment Lives with: lives with their family and lives with their spouse, both are retired Lives in: House/apartment   Patient Goals: Strengthen back    OBJECTIVE:   Patient [name] 56, predicted outcome score of 63   Gross Musculoskeletal Assessment Tremor: None Bulk: Normal Tone: Normal No visible step-off along  spinal column, no apparent rib flare or gross asymmetry in standing   GAIT: Mild dynamic varus in L knee with loading response   Posture: Lumbar lordosis: WNL Lumbar lateral shift: Negative Lower crossed syndrome (tight hip flexors and erector spinae; weak gluts and abs): Negative   AROM  AROM (Normal range in degrees) AROM  Initial evaluation  AROM 01/25/22  Lumbar    Flexion (65) 100% 100%  Extension (30) 100%* 100%  Right lateral flexion (25) 75%* 100%  Left lateral flexion (25) 100%* 100%  Right rotation (30) 100%* 100%  Left rotation (30) 75% 100%       Hip Right Left   Flexion (125) WNL WNL   Extension (15)     Abduction (40)     Adduction      Internal Rotation (45)     External Rotation (45) WNL WNL        (* = pain; Blank rows = not tested)   LE MMT:  MMT (out of 5) Right 01/25/2022 Left 01/25/2022  Hip flexion 5 5  Hip extension 4+ 4+  Hip abduction    Hip adduction    Hip internal rotation    Hip external rotation    Knee flexion 5 5  Knee extension 5 5  Ankle dorsiflexion 5 5  Ankle plantarflexion    Ankle inversion    Ankle eversion    (* = pain; Blank rows = not tested)   Sensation Grossly intact to light touch bilateral LEs as determined by testing dermatomes L2-S2, mild dec light touch sensation R great toe    Reflexes R/L Knee Jerk (L3/4): 2+/2+  Ankle Jerk (S1/2): 2+/1+  Clonus: R Negative, L Negative Homan's Sign: R Negative, L Negative   Muscle Length Hamstrings: R: Negative L: Negative Ely (quadriceps): R: Positive L: Positive Thomas (hip flexors): R: Not examined L: Not examined    Palpation  Location LEFT  RIGHT           Lumbar paraspinals 0 0  Quadratus Lumborum 0 0  Gluteus Maximus  1  Gluteus Medius  1  Deep hip external rotators  0  PSIS 0 0  Fortin's Area (SIJ)    Greater Trochanter  0  (Blank rows = not tested) Graded on 0-4 scale (0 = no pain, 1 = pain, 2 = pain with wincing/grimacing/flinching, 3 =  pain with withdrawal, 4 = unwilling to allow palpation), (Blank rows = not tested)   Passive Accessory Intervertebral Motion Pt denies reproduction of back pain with CPA L1-L5 and UPA bilaterally L1-L5. Generally, hypomobile along L3-S1 and along T7-T12 with CPA    SPECIAL TESTS Lumbar Radiculopathy and Discogenic: Centralization and Peripheralization (SN 92, -LR 0.12): Mild axial pain transiently with repeated extension, no apparent peripheralization/centralization Slump (SN 83, -LR 0.32): R: Negative L: Negative SLR (SN 92, -LR 0.29): R: Positive L:  Negative Crossed SLR (SP 90): R: Negative L: Negative  Facet Joint: Extension-Rotation (SN 100, -LR 0.0): R: Positive L: Negative  Lumbar Foraminal Stenosis: Lumbar quadrant (SN 70): R: Positive for mild R flank pain L: Negative  Hip: FABER (SN 81): R: Positive for pain in lateral hip L: Negative  Piriformis Syndrome: PACE Sign: Negative    TODAY'S TREATMENT    Subjective: Patient reports some pain between Wednesday and Thursday last week. Patient reports overall feeling well. He reports having 2 episodes of increased low back pain during this episode of care. He reports he can participate in his normal activtities typically. He reports the evening after playing golf this past Wednesday, he had pain in back. He reports doing well over the weekend. Pt reports pain up to 3/10 during aforementioned episode above. Pt reports completing mowing in his yard and playing golf Thursday-Friday without notable symptoms. Patient reports compliance with  HEP. 80% global rating of improvement.     Therapeutic Exercise:  GOAL UPDATE PERFORMED*  Nu-Step L6 for 5  minutes for thoracolumbar rotation mobility; seat at 8, arms at 10; subjective information gathered during this time  Side lying open books for thoracic mobility: 1x20/direction with Blue Tband   Hook lying single leg glut bridge with contralateral SLR: 2x8/LE.  Squats to chair,  butt tap on standard-height chair ; 2x8 with 10# Medball held in front- PT cues for upright posture and positioning of feet  Standing anti-rotation press with Black Tband anchored in doorway; 2x10, 1 sec hold per side.   Woodchop with Black Tband anchored in doorway; low to high in standing; x20 each, to R and to L; pt demonstration and verbal cueing for technique and direction of pull for Tband   PATIENT EDUCATION: Discussed current progress in PT, prognosis, POC and transition to HEP. Updated HEP printout.      *not today*  Lower trunk rotations with QL bias (LE in figure-4 position); x10 with each LE crossed  Bird dog; 2x5, quadruped   Prone hip extension with knees extended: 2x10. 5# AW at ankles. Min TC's for avoidance of lumbar lordosis compensation.   Side lying hip abduction: 2x10. 5# AW. PT verbal cues to avoid hip flexion/TFL compensation.  Lower trunk rotations with calves on Silver physioball; 2x10 alternating R/L - verbal cueing for using core to pull lower extremities back to center  Repeated extension in standing; 2x10  Repeated extension in lying; 2x10 Figure 4 hip ER stretch R LE: 30 sec x 3 (also added to HEP today) Lumbar trunk rotations: 1x20/direction. Good carryover from previous session Seated thoracic extension: pillow at low back to stabilize lumbar spine and small bolster at mid back as TC for thoracic extension. 2x12.      PATIENT EDUCATION:  Education details: tactile cueing, verbal cueing, and demonstration to ascertain proper exercise technique as needed for optimal benefit. Person educated: Patient Education method: Explanation, Demonstration, and Handouts Education comprehension: verbalized understanding and returned demonstration   HOME EXERCISE PROGRAM: Access Code: [email or web address] URL: [email or web address] Date: 01/25/2022 Prepared by: Valentina Gu  Exercises - Prone Press Up  - 5-6 x daily - 7 x weekly - 1 sets - 10 reps - 1sec  hold - Sidelying Thoracic Rotation with Open Book  - 2 x daily - 7 x weekly - 2 sets - 10 reps - 2sec hold - Bird Dog  - 1 x daily - 3-4 x weekly - 2 sets - 10 reps - Single Leg Bridge  - 1 x daily - 3-4 x weekly - 2 sets - 10 reps - Standing Anti-Rotation Press with Anchored Resistance  - 1 x daily - 3-4 x weekly - 2 sets - 10 reps - Reverse Chop with Resistance  - 2 x daily - 3-4 x weekly - 2 sets - 10 reps - Prone Hip Extension  - 2 x daily - 3-4 x weekly - 2 sets - 10 reps   ASSESSMENT:  CLINICAL IMPRESSION Patient has made good progress to date and has been able to manage his chronic condition with focus on repeated extension program, thoracolumbar mobility, and core strengthening. He had two intermittent flare-ups during this episode of care, but patient's condition has since improved and he has been able to continue PT with focus on active intervention/strengthening. Pt has met FOTO goal and other established PT goals. Pt has been able to participate in golf and desired activities without flare-up  of symptoms. Current plan is to complete POC this week. Pt will continue to benefit from skilled PT services and transition to home program for long-term maintenance for thoracolumbar mobility, management of low back pain, and improved strength for patient's PLOF.   REHAB POTENTIAL: Good  CLINICAL DECISION MAKING: Evolving/moderate complexity  EVALUATION COMPLEXITY: Moderate   GOALS:  SHORT TERM GOALS: Target date: 01/04/22  Pt will be independent with HEP to improve strength and decrease back pain to improve pain-free function at home and work. Baseline: 12/21/21: Baseline HEP initiated.   01/25/22: Pt is compliant with HEP Goal status: ACHIEVED   LONG TERM GOALS: Target date: 02/01/2022  Pt will increase FOTO to at least 63 to demonstrate significant improvement in function at home and work related to back pain  Baseline:  12/21/21: 56.   01/25/22: 83 Goal status: ACHIEVED  2.  Pt  will decrease worst back pain by at least 2 points on the NPRS in order to demonstrate clinically significant reduction in back pain. Baseline: 12/21/21: 10/10 at worst.  01/25/22: 3/10 at worst.  Goal status: ACHIEVED  3.  Patient will have full thoracolumbar AROM without reproduction of symptoms as needed for self-care ADLs, reaching, bending to retrieve low-lying items, household chores    Baseline: 12/21/21: Decreased motion with R lateral flexion and L rotation; pain reproduction with extension, bilateral lateral flexion, and R rotation.  01/25/22: Good ROM without pain reproduction  Goal status: ACHIEVED   4.  Patient will complete shopping outing with his wife without increase in back pain > 1-2/10 as needed for participation in community activity with his spouse Baseline: 12/21/21: Pain with prolonged standing and community outings with spouse.  01/25/22: Pt able to complete with "no problems"/pt completed shopping with wife yesterday.  Goal status: ACHIEVED  5.  Patient will perform golf swing with full backswing and follow-through with sound power production and no increase in R flank pain or hip pain as needed for participation in golfing recreationally with friends Baseline: 12/21/21: Intermittent significant pain with golf swing.   01/25/22: Performed with excellent technique, no reproduction of pain.  Goal status: ACHIEVED    PLAN: PT FREQUENCY: 2x/week  PT DURATION: 6 weeks  PLANNED INTERVENTIONS: Therapeutic exercises, Therapeutic activity, Patient/Family education, Joint manipulation, Joint mobilization, Dry Needling, Electrical stimulation, Spinal mobilization, Cryotherapy, Moist heat, Traction, and Manual therapy  PLAN FOR NEXT SESSION: Complete POC this week with one last visit to monitor tolerance of advanced home program and review MDT maintenance phase. Transition to independent home exercise program after this week.    Valentina Gu, PT, DPT #[id number]  Eilleen Kempf 01/25/2022, 10:07 AM

## 2022-01-26 ENCOUNTER — Other Ambulatory Visit: Payer: Self-pay | Admitting: Family Medicine

## 2022-01-26 DIAGNOSIS — I10 Essential (primary) hypertension: Secondary | ICD-10-CM

## 2022-01-27 ENCOUNTER — Ambulatory Visit: Payer: Medicare PPO | Admitting: Physical Therapy

## 2022-01-27 ENCOUNTER — Encounter: Payer: Self-pay | Admitting: Physical Therapy

## 2022-01-27 DIAGNOSIS — M25551 Pain in right hip: Secondary | ICD-10-CM

## 2022-01-27 DIAGNOSIS — M5459 Other low back pain: Secondary | ICD-10-CM

## 2022-01-27 DIAGNOSIS — M6281 Muscle weakness (generalized): Secondary | ICD-10-CM

## 2022-01-27 NOTE — Therapy (Signed)
OUTPATIENT PHYSICAL THERAPY TREATMENT      Patient Name: Tyler Roberts MRN: [id number] DOB:Jul 05, 1955, 66 y.o., male Today's Date: 01/27/2022   PT End of Session - 01/27/22 0932     Visit Number 11    Number of Visits 13    Date for PT Re-Evaluation 02/01/22    Authorization Type [employer] 2023 - VL based on authorization    Authorization Time Period initial eval 12/21/21, auth 9/18-10/[id number]    Authorization - Number of Visits 11    Progress Note Due on Visit 10    PT Start Time 0929    PT Stop Time 1008    PT Time Calculation (min) 39 min    Activity Tolerance No increased pain;Patient tolerated treatment well    Behavior During Therapy [facility] for tasks assessed/performed                Past Medical History:  Diagnosis Date   Actinic keratosis    Shingles 09/26/2014   Past Surgical History:  Procedure Laterality Date   HERNIA REPAIR  1990   KNEE SURGERY Right 2008   Due to a staph infection   LASIK     NOSE SURGERY  2002   TONSILLECTOMY AND ADENOIDECTOMY  2002   Patient Active Problem List   Diagnosis Date Noted   Hypogonadism male 04/13/2021   Erectile dysfunction 10/08/2015   LBP (low back pain) 09/26/2014   Allergic rhinitis 09/28/2009   Benign essential hypertension 09/23/2009   Fam hx-ischem heart disease 09/05/2006   Hypotestosteronism 09/05/2006   Obstructive apnea 03/03/2006   Hyperglyceridemia, pure 09/13/2005   Acid reflux 04/06/2003    PCP: [name], MD  REFERRING PROVIDER: [name], MD  REFERRING DIAGNOSIS: M54.16 (ICD-10-CM) - Radiculopathy, lumbar region  THERAPY DIAG: Other low back pain  Pain in right hip  Muscle weakness (generalized)  RATIONALE FOR EVALUATION AND TREATMENT: Rehabilitation  ONSET DATE: episodic pain last 5 years, most recent flare-up around 09/03/2021  FOLLOW UP APPT WITH PROVIDER: Yes , follow up with MD in November    SUBJECTIVE HISTORY (EVAL):                                                                                                                                                                                          Chief Complaint: Patient is a 66 year old male with primary complaint of low back pain, referred for lumbar radiculopathy.   Pertinent History Patient is a 66 year old male with primary complaint of low back pain, referred for lumbar radiculopathy. Pt reports over 5 years of back problems. He states he was going to orthopedist - pt  was referred to Dr. Ronnald Ramp (neurosurgeon) in [place], [place]. Pt has been getting steroid injections over last few years and this has helped "off and on." He reports having one in June that didn't help; he is having another one 12/22/21 at different spinal level. Most recent MRI showing ongoing bulging discs and degenerative changes.  Pt reports pain with playing golf. He gets stabbing pain in back with bending over. MD informed pt that if he gets surgery, it will include hardware/fusion. Pt reports difficulty getting up in AM usually. Pt reports intermittent disturbed sleep due to back hurting - pt reports this can get better with either lying flat on back or lying on side with knees to chest. Patient reports no sudden weight loss. No bowel/bladder changes. No personal cardiac Hx (pt does have 1st-degree family Hx). No history of cancers. Symptoms are variable. Pt reports not having pain when on the move and walking. Pt was informed of mild levoscoliosis per recent imaging.   Pain:  Pain Intensity: Present: 1/10, Best: /10, Worst: 10/10 Pain location: Low back, R hip, R flank; intermittently to R groin  Pain Quality: stabbing  Radiating: Yes , to R hip/groin  Numbness/Tingling: Yes, in hands, none in lower extremities  Focal Weakness: yes, losing strength in his hands Aggravating factors: symptoms are worst with bending over, weed eating positioning, swinging golf club, prolonged standing in one spot Relieving factors: propping legs up, pillow on  his low back, lying down in bed  24-hour pain behavior: worse in AM History of prior back injury, pain, surgery, or therapy: Yes, episodic back pain over last 5 years  Falls: Has patient fallen in last 6 months? No, Number of falls: N/A Follow-up appointment with MD: Yes, in November Imaging: Yes , MRI about 2 weeks ago; pt was informed that there was no major changes on imaging versus previous scans  MRI of lumbar spine from 06/2016 1. Central/right subarticular disc extrusion with inferior migration at L2-3 with resultant moderate right subarticular stenosis. 2. Moderate right lateral recess stenosis at L3-4 related to degenerative disc bulge and facet disease. 3. Left eccentric disc bulging with facet disease at L4-5, resulting in moderate left lateral recess and foraminal stenosis. 4. Small left foraminal disc protrusion at L5-S1, closely approximating the exiting left L5 nerve root. 5. Additional more mild multilevel degenerative spondylolysis as above. Please see above report for a full description of these findings.  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; yard work,, washing cars [employer] (bowel/bladder changes, saddle paresthesia, personal history of cancer, h/o spinal tumors, h/o compression fx, h/o abdominal aneurysm, abdominal pain, chills/fever, night sweats, nausea, vomiting, unrelenting pain, first onset of insidious LBP <20 y/o): Negative  Precautions: None  Weight Bearing Restrictions: No  Living Environment Lives with: lives with their family and lives with their spouse, both are retired Lives in: House/apartment   Patient Goals: Strengthen back    OBJECTIVE:   Patient Bridger 56, predicted outcome score of 63   Gross Musculoskeletal Assessment Tremor: None Bulk: Normal Tone: Normal No visible step-off along spinal column, no apparent rib flare or gross asymmetry in  standing   GAIT: Mild dynamic varus in L knee with loading response   Posture: Lumbar lordosis: WNL Lumbar lateral shift: Negative Lower crossed syndrome (tight hip flexors and erector spinae; weak gluts and abs): Negative   AROM  AROM (Normal range in degrees) AROM  Initial evaluation  AROM 01/25/22  Lumbar    Flexion (65) 100% 100%  Extension (30) 100%* 100%  Right lateral flexion (25) 75%* 100%  Left lateral flexion (25) 100%* 100%  Right rotation (30) 100%* 100%  Left rotation (30) 75% 100%       Hip Right Left   Flexion (125) WNL WNL   Extension (15)     Abduction (40)     Adduction      Internal Rotation (45)     External Rotation (45) WNL WNL        (* = pain; Blank rows = not tested)   LE MMT:  MMT (out of 5) Right 01/27/2022 Left 01/27/2022  Hip flexion 5 5  Hip extension 4+ 4+  Hip abduction    Hip adduction    Hip internal rotation    Hip external rotation    Knee flexion 5 5  Knee extension 5 5  Ankle dorsiflexion 5 5  Ankle plantarflexion    Ankle inversion    Ankle eversion    (* = pain; Blank rows = not tested)   Sensation Grossly intact to light touch bilateral LEs as determined by testing dermatomes L2-S2, mild dec light touch sensation R great toe    Reflexes R/L Knee Jerk (L3/4): 2+/2+  Ankle Jerk (S1/2): 2+/1+  Clonus: R Negative, L Negative Homan's Sign: R Negative, L Negative   Muscle Length Hamstrings: R: Negative L: Negative Ely (quadriceps): R: Positive L: Positive Thomas (hip flexors): R: Not examined L: Not examined    Palpation  Location LEFT  RIGHT           Lumbar paraspinals 0 0  Quadratus Lumborum 0 0  Gluteus Maximus  1  Gluteus Medius  1  Deep hip external rotators  0  PSIS 0 0  Fortin's Area (SIJ)    Greater Trochanter  0  (Blank rows = not tested) Graded on 0-4 scale (0 = no pain, 1 = pain, 2 = pain with wincing/grimacing/flinching, 3 = pain with withdrawal, 4 = unwilling to allow palpation),  (Blank rows = not tested)   Passive Accessory Intervertebral Motion Pt denies reproduction of back pain with CPA L1-L5 and UPA bilaterally L1-L5. Generally, hypomobile along L3-S1 and along T7-T12 with CPA    SPECIAL TESTS Lumbar Radiculopathy and Discogenic: Centralization and Peripheralization (SN 92, -LR 0.12): Mild axial pain transiently with repeated extension, no apparent peripheralization/centralization Slump (SN 83, -LR 0.32): R: Negative L: Negative SLR (SN 92, -LR 0.29): R: Positive L:  Negative Crossed SLR (SP 90): R: Negative L: Negative  Facet Joint: Extension-Rotation (SN 100, -LR 0.0): R: Positive L: Negative  Lumbar Foraminal Stenosis: Lumbar quadrant (SN 70): R: Positive for mild R flank pain L: Negative  Hip: FABER (SN 81): R: Positive for pain in lateral hip L: Negative  Piriformis Syndrome: PACE Sign: Negative    TODAY'S TREATMENT    Subjective: Patient reports feeling well his AM. He reports some stiffness and mild pain in evening after golf - "it was not bad" per patient's report. Patient reports compliance with HEP and reports doing well with new exercises/techniques at home.    Therapeutic Exercise:  Nu-Step L5 for 5  minutes for thoracolumbar rotation mobility; seat at 8, arms at 10; subjective information gathered during this time   AROM Lumbar flexion 100% Lumbar extension 100% Lateral flexion: R 100% (mild discomfort) , L 100% Thoracolumbar rotation: R 100%, L 100%   Standing open books for thoracic mobility: 1x20/direction with Blue Tband  Standing anti-rotation press with walkout, Nautilus; x10 ea dir, 40-lbs   Woodchop with 40# on Nautilus machine; low to high in standing; 2x10 each, to R and to L; pt demonstration and verbal cueing for technique and direction of pull for Tband   PATIENT EDUCATION: Reviewed MDT maintenance phase concepts and reviewed advanced HEP and ensured readiness for continued IND home program.      *not  today*  Squats to chair, butt tap on standard-height chair ; 2x8 with 10# Medball held in front- PT cues for upright posture and positioning of feet Hook lying single leg glut bridge with contralateral SLR: 2x8/LE. Lower trunk rotations with QL bias (LE in figure-4 position); x10 with each LE crossed  Bird dog; 2x5, quadruped   Prone hip extension with knees extended: 2x10. 5# AW at ankles. Min TC's for avoidance of lumbar lordosis compensation.   Side lying hip abduction: 2x10. 5# AW. PT verbal cues to avoid hip flexion/TFL compensation.  Lower trunk rotations with calves on Silver physioball; 2x10 alternating R/L - verbal cueing for using core to pull lower extremities back to center  Repeated extension in standing; 2x10  Repeated extension in lying; 2x10 Figure 4 hip ER stretch R LE: 30 sec x 3 (also added to HEP today) Lumbar trunk rotations: 1x20/direction. Good carryover from previous session Seated thoracic extension: pillow at low back to stabilize lumbar spine and small bolster at mid back as TC for thoracic extension. 2x12.      PATIENT EDUCATION:  Education details: tactile cueing, verbal cueing, and demonstration to ascertain proper exercise technique as needed for optimal benefit. Person educated: Patient Education method: Explanation, Demonstration, and Handouts Education comprehension: verbalized understanding and returned demonstration   HOME EXERCISE PROGRAM: Access Code: [email or web address] URL: [email or web address] Date: 01/25/2022 Prepared by: Valentina Gu  Exercises - Prone Press Up  - 5-6 x daily - 7 x weekly - 1 sets - 10 reps - 1sec hold - Sidelying Thoracic Rotation with Open Book  - 2 x daily - 7 x weekly - 2 sets - 10 reps - 2sec hold - Bird Dog  - 1 x daily - 3-4 x weekly - 2 sets - 10 reps - Single Leg Bridge  - 1 x daily - 3-4 x weekly - 2 sets - 10 reps - Standing Anti-Rotation Press with Anchored Resistance  - 1 x daily - 3-4 x weekly - 2 sets  - 10 reps - Reverse Chop with Resistance  - 2 x daily - 3-4 x weekly - 2 sets - 10 reps - Prone Hip Extension  - 2 x daily - 3-4 x weekly - 2 sets - 10 reps   ASSESSMENT:  CLINICAL IMPRESSION Patient\ has met all PT goals and has been able to perform advanced home program without notable difficulty. He has been able to manage episodic increases in pain well with activity modification, repeated movement, and home exercise. Reviewed MDT maintenance phase principles and advanced HEP; discussed goals for returning to gym-based exercise regimen for wellness and discussed performing techniques with neutral spine versus significant kyphotic positioning. Patient is appropriate for continued independent HEP and discharge for current formal PT case.  REHAB POTENTIAL: Good  CLINICAL DECISION MAKING: Evolving/moderate complexity  EVALUATION COMPLEXITY: Moderate   GOALS:  SHORT TERM GOALS: Target date: 01/04/22  Pt will be independent with HEP to improve strength and decrease back pain to improve pain-free function at home and work. Baseline: 12/21/21: Baseline HEP initiated.   01/25/22: Pt is compliant  with HEP Goal status: ACHIEVED   LONG TERM GOALS: Target date: 02/01/2022  Pt will increase FOTO to at least 63 to demonstrate significant improvement in function at home and work related to back pain  Baseline:  12/21/21: 56.   01/25/22: 83 Goal status: ACHIEVED  2.  Pt will decrease worst back pain by at least 2 points on the NPRS in order to demonstrate clinically significant reduction in back pain. Baseline: 12/21/21: 10/10 at worst.  01/25/22: 3/10 at worst.  Goal status: ACHIEVED  3.  Patient will have full thoracolumbar AROM without reproduction of symptoms as needed for self-care ADLs, reaching, bending to retrieve low-lying items, household chores    Baseline: 12/21/21: Decreased motion with R lateral flexion and L rotation; pain reproduction with extension, bilateral lateral flexion, and  R rotation.  01/25/22: Good ROM without pain reproduction  Goal status: ACHIEVED   4.  Patient will complete shopping outing with his wife without increase in back pain > 1-2/10 as needed for participation in community activity with his spouse Baseline: 12/21/21: Pain with prolonged standing and community outings with spouse.  01/25/22: Pt able to complete with "no problems"/pt completed shopping with wife yesterday.  Goal status: ACHIEVED  5.  Patient will perform golf swing with full backswing and follow-through with sound power production and no increase in R flank pain or hip pain as needed for participation in golfing recreationally with friends Baseline: 12/21/21: Intermittent significant pain with golf swing.   01/25/22: Performed with excellent technique, no reproduction of pain.  Goal status: ACHIEVED    PLAN: PT FREQUENCY: 2x/week  PT DURATION: 6 weeks  PLANNED INTERVENTIONS: Therapeutic exercises, Therapeutic activity, Patient/Family education, Joint manipulation, Joint mobilization, Dry Needling, Electrical stimulation, Spinal mobilization, Cryotherapy, Moist heat, Traction, and Manual therapy  PLAN FOR NEXT SESSION: Transition to independent home exercise program at this time. Pt may f/u with MD for further needs or with PT if condition regresses over next month.    Valentina Gu, PT, DPT #[id number]  Eilleen Kempf 01/27/2022, 10:11 AM

## 2022-01-29 ENCOUNTER — Other Ambulatory Visit: Payer: Self-pay | Admitting: Dermatology

## 2022-01-29 ENCOUNTER — Other Ambulatory Visit: Payer: Self-pay | Admitting: Family Medicine

## 2022-01-29 DIAGNOSIS — E781 Pure hyperglyceridemia: Secondary | ICD-10-CM

## 2022-01-29 DIAGNOSIS — L300 Nummular dermatitis: Secondary | ICD-10-CM

## 2022-01-29 NOTE — Telephone Encounter (Signed)
Patient will need an office visit for further refills. Requested Prescriptions  Pending Prescriptions Disp Refills  . lovastatin (MEVACOR) 20 MG tablet [Pharmacy Med Name: LOVASTATIN 20 MG TAB] 90 tablet 0    Sig: TAKE 1 TABLET BY MOUTH DAILY     Cardiovascular:  Antilipid - Statins 2 Failed - 01/29/2022 12:07 PM      Failed - Valid encounter within last 12 months    Recent Outpatient Visits          10 months ago Encounter for initial preventive physical examination covered by [facility] [name], MD   1 year ago Annual physical exam   [facility] [name], MD   2 years ago Annual physical exam   [facility] [name], MD   2 years ago [place], [place], [place]   4 years ago Annual physical exam   [facility] [name], MD      Future Appointments            In 3 days Brendolyn Patty, MD [place]           Failed - Lipid Panel in normal range within the last 12 months    Cholesterol, Total  Date Value Ref Range Status  04/01/2021 177 100 - 199 mg/dL Final   LDL Cholesterol (Calc)  Date Value Ref Range Status  12/15/2016 96 mg/dL (calc) Final    Comment:    Reference range: <100 . Desirable range <100 mg/dL for primary prevention;   <70 mg/dL for patients with CHD or diabetic patients  with > or = 2 CHD risk factors. Marland Kitchen LDL-C is now calculated using the Martin-Hopkins  calculation, which is a validated novel method providing  better accuracy than the Friedewald equation in the  estimation of LDL-C.  Cresenciano Genre et al. Annamaria Helling. 1601;093(23): 2061-2068  (http://education.[employer].com/faq/FAQ164)    LDL Chol Calc (NIH)  Date Value Ref Range Status  04/01/2021 98 0 - 99 mg/dL Final   HDL  Date Value Ref Range Status  04/01/2021 41 >39 mg/dL Final   Triglycerides  Date Value Ref Range Status  04/01/2021 223 (H) 0  - 149 mg/dL Final         Passed - Cr in normal range and within 360 days    Creat  Date Value Ref Range Status  02/16/2017 0.82 0.70 - 1.25 mg/dL Final    Comment:    For patients >83 years of age, the reference limit for Creatinine is approximately 13% higher for people identified as African-American. .    Creatinine, Ser  Date Value Ref Range Status  04/01/2021 0.97 0.76 - 1.27 mg/dL Final         Passed - Patient is not pregnant

## 2022-02-01 ENCOUNTER — Ambulatory Visit: Payer: Medicare PPO | Admitting: Dermatology

## 2022-02-01 DIAGNOSIS — L300 Nummular dermatitis: Secondary | ICD-10-CM

## 2022-02-01 DIAGNOSIS — D2239 Melanocytic nevi of other parts of face: Secondary | ICD-10-CM

## 2022-02-01 DIAGNOSIS — L578 Other skin changes due to chronic exposure to nonionizing radiation: Secondary | ICD-10-CM

## 2022-02-01 DIAGNOSIS — L821 Other seborrheic keratosis: Secondary | ICD-10-CM

## 2022-02-01 DIAGNOSIS — L814 Other melanin hyperpigmentation: Secondary | ICD-10-CM

## 2022-02-01 DIAGNOSIS — Z1283 Encounter for screening for malignant neoplasm of skin: Secondary | ICD-10-CM | POA: Diagnosis not present

## 2022-02-01 DIAGNOSIS — D1801 Hemangioma of skin and subcutaneous tissue: Secondary | ICD-10-CM

## 2022-02-01 DIAGNOSIS — L720 Epidermal cyst: Secondary | ICD-10-CM

## 2022-02-01 DIAGNOSIS — L570 Actinic keratosis: Secondary | ICD-10-CM | POA: Diagnosis not present

## 2022-02-01 NOTE — Progress Notes (Signed)
Follow-Up Visit   Subjective  Tyler Roberts is a 66 y.o. male who presents for the following: Follow-up.  The patient presents for 6 month follow-up and Upper Body Skin Exam (UBSE) for skin cancer screening and mole check.  The patient has spots, moles and lesions to be evaluated, some may be new or changing, including right ear. He has a history of Aks. He has a history of Nummular Dermatitis of the back, controlled with TMC 0.1% Cream.   The following portions of the chart were reviewed this encounter and updated as appropriate:       Review of Systems:  No other skin or systemic complaints except as noted in HPI or Assessment and Plan.  Objective  Well appearing patient in no apparent distress; mood and affect are within normal limits.  All skin waist up examined.  R mid ear helix x 1 Keratotic papule  R paranasal 2.0 mm firm flesh papule  L forearm x 3 (3) Pink scaly macules.  upper back Clear today.     Assessment & Plan  Skin cancer screening performed today.  Actinic Damage - chronic, secondary to cumulative UV radiation exposure/sun exposure over time - diffuse scaly erythematous macules with underlying dyspigmentation - Recommend daily broad spectrum sunscreen SPF 30+ to sun-exposed areas, reapply every 2 hours as needed.  - Recommend staying in the shade or wearing long sleeves, sun glasses (UVA+UVB protection) and wide brim hats (4-inch brim around the entire circumference of the hat). - Call for new or changing lesions.  Lentigines - Scattered tan macules - Due to sun exposure - Benign-appearing, observe - Recommend daily broad spectrum sunscreen SPF 30+ to sun-exposed areas, reapply every 2 hours as needed. - Call for any changes  Seborrheic Keratoses - Stuck-on, waxy, tan-brown papules and/or plaques  - Benign-appearing - Discussed benign etiology and prognosis. - Observe - Call for any changes.  Hemangiomas - Red papules - Discussed benign  nature - Observe - Call for any changes  Milia - tiny firm white papule of the left lower lip - type of cyst - benign - may be extracted if symptomatic - observe  Hypertrophic actinic keratosis R mid ear helix x 1  Recheck on f/up  Actinic keratoses are precancerous spots that appear secondary to cumulative UV radiation exposure/sun exposure over time. They are chronic with expected duration over 1 year. A portion of actinic keratoses will progress to squamous cell carcinoma of the skin. It is not possible to reliably predict which spots will progress to skin cancer and so treatment is recommended to prevent development of skin cancer.  Recommend daily broad spectrum sunscreen SPF 30+ to sun-exposed areas, reapply every 2 hours as needed.  Recommend staying in the shade or wearing long sleeves, sun glasses (UVA+UVB protection) and wide brim hats (4-inch brim around the entire circumference of the hat). Call for new or changing lesions.  Destruction of lesion - R mid ear helix x 1  Destruction method: cryotherapy   Informed consent: discussed and consent obtained   Lesion destroyed using liquid nitrogen: Yes   Region frozen until ice ball extended beyond lesion: Yes   Outcome: patient tolerated procedure well with no complications   Post-procedure details: wound care instructions given   Additional details:  Prior to procedure, discussed risks of blister formation, small wound, skin dyspigmentation, or rare scar following cryotherapy. Recommend Vaseline ointment to treated areas while healing.   Fibrous papule of nose R paranasal  Benign, observe.  AK (actinic keratosis) (3) L forearm x 3  Actinic keratoses are precancerous spots that appear secondary to cumulative UV radiation exposure/sun exposure over time. They are chronic with expected duration over 1 year. A portion of actinic keratoses will progress to squamous cell carcinoma of the skin. It is not possible to  reliably predict which spots will progress to skin cancer and so treatment is recommended to prevent development of skin cancer.  Recommend daily broad spectrum sunscreen SPF 30+ to sun-exposed areas, reapply every 2 hours as needed.  Recommend staying in the shade or wearing long sleeves, sun glasses (UVA+UVB protection) and wide brim hats (4-inch brim around the entire circumference of the hat). Call for new or changing lesions.  Destruction of lesion - L forearm x 3  Destruction method: cryotherapy   Informed consent: discussed and consent obtained   Lesion destroyed using liquid nitrogen: Yes   Region frozen until ice ball extended beyond lesion: Yes   Outcome: patient tolerated procedure well with no complications   Post-procedure details: wound care instructions given   Additional details:  Prior to procedure, discussed risks of blister formation, small wound, skin dyspigmentation, or rare scar following cryotherapy. Recommend Vaseline ointment to treated areas while healing.   Nummular dermatitis upper back  Chronic condition with duration or expected duration over one year. Currently well-controlled.   Recommend mild soap and moisturizing cream 1-2 times daily.  Gentle skin care handout provided.   Continue TMC 0.1% Cream QD/BID prn flares. Patient has refill. Avoid face, groin, axilla.   Topical steroids (such as triamcinolone, fluocinolone, fluocinonide, mometasone, clobetasol, halobetasol, betamethasone, hydrocortisone) can cause thinning and lightening of the skin if they are used for too long in the same area. Your physician has selected the right strength medicine for your problem and area affected on the body. Please use your medication only as directed by your physician to prevent side effects.    Related Medications triamcinolone cream (KENALOG) 0.1 % APPLY TO AFFECTED AREAS OF ITCHY RASH TOPICALLY ONCE TO TWICE DAILY UNITL CLEAR THEN AS NEEDED FOR FLARES. AVOID  FACE,GROIN, AND AXILLA.   Return in about 6 months (around 08/03/2022) for AKs.  IJamesetta Orleans, CMA, am acting as scribe for Brendolyn Patty, MD .  Documentation: I have reviewed the above documentation for accuracy and completeness, and I agree with the above.  Brendolyn Patty MD

## 2022-02-01 NOTE — Patient Instructions (Addendum)
Cryotherapy Aftercare  Wash gently with soap and water everyday.   Apply Vaseline and Band-Aid daily until healed.   Triamcinolone 0.1% Cream - Apply to rash on back once to twice daily until improved. Avoid face, groin, underarms.  Topical steroids (such as triamcinolone, fluocinolone, fluocinonide, mometasone, clobetasol, halobetasol, betamethasone, hydrocortisone) can cause thinning and lightening of the skin if they are used for too long in the same area. Your physician has selected the right strength medicine for your problem and area affected on the body. Please use your medication only as directed by your physician to prevent side effects.   Gentle Skin Care Guide  1. Bathe no more than once a day.  2. Avoid bathing in hot water  3. Use a mild soap like Dove, Vanicream, Cetaphil, CeraVe. Can use Lever 2000 or Cetaphil antibacterial soap  4. Use soap only where you need it. On most days, use it under your arms, between your legs, and on your feet. Let the water rinse other areas unless visibly dirty.  5. When you get out of the bath/shower, use a towel to gently blot your skin dry, don't rub it.  6. While your skin is still a little damp, apply a moisturizing cream such as Vanicream, CeraVe, Cetaphil, Eucerin, Sarna lotion or plain Vaseline Jelly. For hands apply Neutrogena [place] Hand Cream or Excipial Hand Cream.  7. Reapply moisturizer any time you start to itch or feel dry.  8. Sometimes using free and clear laundry detergents can be helpful. Fabric softener sheets should be avoided. Downy Free & Gentle liquid, or any liquid fabric softener that is free of dyes and perfumes, it acceptable to use  9. If your doctor has given you prescription creams you may apply moisturizers over them     Due to recent changes in healthcare laws, you may see results of your pathology and/or laboratory studies on MyChart before the doctors have had a chance to review them. We understand that  in some cases there may be results that are confusing or concerning to you. Please understand that not all results are received at the same time and often the doctors may need to interpret multiple results in order to provide you with the best plan of care or course of treatment. Therefore, we ask that you please give [place] 2 business days to thoroughly review all your results before contacting the office for clarification. Should we see a critical lab result, you will be contacted sooner.   If You Need Anything After Your Visit  If you have any questions or concerns for your doctor, please call our main line at [phone] and press option 4 to reach your doctor's medical assistant. If no one answers, please leave a voicemail as directed and we will return your call as soon as possible. Messages left after 4 pm will be answered the following business day.   You may also send [place] a message via [place]. We typically respond to MyChart messages within 1-2 business days.  For prescription refills, please ask your pharmacy to contact our office. Our fax number is [phone].  If you have an urgent issue when the clinic is closed that cannot wait until the next business day, you can page your doctor at the number below.    Please note that while we do our best to be available for urgent issues outside of office hours, we are not available 24/7.   If you have an urgent issue and are unable to  reach [place], you may choose to seek medical care at your doctor's office, retail clinic, urgent care center, or emergency room.  If you have a medical emergency, please immediately call 911 or go to the emergency department.  Pager Numbers  - Dr. Nehemiah Massed: [phone]  - Dr. Laurence Ferrari: [phone]  - Dr. Nicole Kindred: [phone]  In the event of inclement weather, please call our main line at [phone] for an update on the status of any delays or closures.  Dermatology Medication Tips: Please keep the boxes  that topical medications come in in order to help keep track of the instructions about where and how to use these. Pharmacies typically print the medication instructions only on the boxes and not directly on the medication tubes.   If your medication is too expensive, please contact our office at [phone] option 4 or send [place] a message through [place].   We are unable to tell what your co-pay for medications will be in advance as this is different depending on your insurance coverage. However, we may be able to find a substitute medication at lower cost or fill out paperwork to get insurance to cover a needed medication.   If a prior authorization is required to get your medication covered by your insurance company, please allow [place] 1-2 business days to complete this process.  Drug prices often vary depending on where the prescription is filled and some pharmacies may offer cheaper prices.  The website www.goodrx.com contains coupons for medications through different pharmacies. The prices here do not account for what the cost may be with help from insurance (it may be cheaper with your insurance), but the website can give you the price if you did not use any insurance.  - You can print the associated coupon and take it with your prescription to the pharmacy.  - You may also stop by our office during regular business hours and pick up a GoodRx coupon card.  - If you need your prescription sent electronically to a different pharmacy, notify our office through [facility] or by phone at [phone] option 4.     Si Usted Necesita Algo Despus de Su Visita  Tambin puede enviarnos un mensaje a travs de Pharmacist, community. Por lo general respondemos a los mensajes de MyChart en el transcurso de 1 a 2 das hbiles.  Para renovar recetas, por favor pida a su farmacia que se ponga en contacto con nuestra oficina. Harland Dingwall de fax es [place] [phone].  Si tiene un asunto urgente cuando la  clnica est cerrada y que no puede esperar hasta el siguiente da hbil, puede llamar/localizar a su doctor(a) al nmero que aparece a continuacin.   Por favor, tenga en cuenta que aunque hacemos todo lo posible para estar disponibles para asuntos urgentes fuera del horario de [place], no estamos disponibles las 24 horas del da, los 7 das de la [place].   Si tiene un problema urgente y no puede comunicarse con nosotros, puede optar por buscar atencin mdica  en el consultorio de su doctor(a), en una clnica privada, en un centro de atencin urgente o en una sala de emergencias.  Si tiene Engineering geologist, por favor llame inmediatamente al 911 o vaya a la sala de emergencias.  Nmeros de bper  - Dr. Nehemiah Massed: [phone]  - Dra. Moye: [phone]  - Dra. Nicole Kindred: [phone]  En caso de inclemencias del [place], por favor llame a Johnsie Kindred principal al [phone] para una actualizacin sobre el [place] de cualquier  retraso o cierre.  Consejos para la medicacin en dermatologa: Por favor, guarde las cajas en las que vienen los medicamentos de uso tpico para ayudarle a seguir las instrucciones sobre dnde y cmo usarlos. Las farmacias generalmente imprimen las instrucciones del medicamento slo en las cajas y no directamente en los tubos del [place].   Si su medicamento es muy caro, por favor, pngase en contacto con Zigmund Daniel llamando al [phone] y presione la opcin 4 o envenos un mensaje a travs de Pharmacist, community.   No podemos decirle cul ser su copago por los medicamentos por adelantado ya que esto es diferente dependiendo de la cobertura de su seguro. Sin embargo, es posible que podamos encontrar un medicamento sustituto a Electrical engineer un formulario para que el seguro cubra el medicamento que se considera necesario.   Si se requiere una autorizacin previa para que su compaa de seguros [place] su medicamento, por favor permtanos de 1 a 2 das hbiles  para completar este proceso.  Los precios de los medicamentos varan con frecuencia dependiendo del Environmental consultant de dnde se surte la receta y alguna farmacias pueden ofrecer precios ms baratos.  El sitio web www.goodrx.com tiene cupones para medicamentos de Airline pilot. Los precios aqu no tienen en cuenta lo que podra costar con la ayuda del seguro (puede ser ms barato con su seguro), pero el sitio web puede darle el precio si no utiliz Research scientist (physical sciences).  - Puede imprimir el cupn correspondiente y llevarlo con su receta a la farmacia.  - Tambin puede pasar por nuestra oficina durante el horario de atencin regular y Charity fundraiser una tarjeta de cupones de GoodRx.  - Si necesita que su receta se enve electrnicamente a una farmacia diferente, informe a nuestra oficina a travs de MyChart de [place] o por telfono llamando al [phone] y presione la opcin 4.

## 2022-02-22 ENCOUNTER — Other Ambulatory Visit: Payer: Self-pay | Admitting: Family Medicine

## 2022-03-24 ENCOUNTER — Other Ambulatory Visit: Payer: Self-pay | Admitting: Family Medicine

## 2022-03-24 DIAGNOSIS — K219 Gastro-esophageal reflux disease without esophagitis: Secondary | ICD-10-CM

## 2022-03-25 NOTE — Telephone Encounter (Signed)
Requested Prescriptions  Pending Prescriptions Disp Refills   esomeprazole (NEXIUM) 40 MG capsule [Pharmacy Med Name: ESOMEPRAZOLE MAGNESIUM 40 MG CAP] 30 capsule 2    Sig: TAKE 1 CAPSULE BY MOUTH ONCE DAILY     Gastroenterology: Proton Pump Inhibitors 2 Failed - 03/24/2022 10:28 AM      Failed - Valid encounter within last 12 months    Recent Outpatient Visits           11 months ago Encounter for initial preventive physical examination covered by [facility] [name], MD   2 years ago Annual physical exam   [facility] [name], MD   3 years ago Annual physical exam   [facility] [name], MD   3 years ago [place] Carles Collet M, [place]   4 years ago Annual physical exam   [facility] [name], MD       Future Appointments             In 2 weeks Caryn Section, Kirstie Peri, MD [facility] [place], [place]   In 4 months Brendolyn Patty, MD [place] - ALT in normal range and within 360 days    ALT  Date Value Ref Range Status  04/01/2021 29 0 - 44 IU/L Final         Passed - AST in normal range and within 360 days    AST  Date Value Ref Range Status  04/01/2021 21 0 - 40 IU/L Final

## 2022-04-08 ENCOUNTER — Telehealth: Payer: Self-pay | Admitting: Family Medicine

## 2022-04-08 NOTE — Telephone Encounter (Signed)
Patient's wife called in says, patient doesn wanted to schedule the AWV appt.

## 2022-04-08 NOTE — Telephone Encounter (Signed)
Left message for patient to call back and schedule Medicare Annual Wellness Visit (AWV) in office.   If not able to come in office, please offer to do virtually or by telephone.  Left office number and my jabber [phone].  Welcome to Medicare: 04/01/2021   Please schedule at anytime with Nurse Health Advisor.

## 2022-04-13 NOTE — Progress Notes (Unsigned)
Tyler Roberts,Tyler Roberts,acting as a scribe for Lelon Huh, MD.,have documented all relevant documentation on the behalf of Lelon Huh, MD,as directed by  Lelon Huh, MD while in the presence of Lelon Huh, MD.    Complete Physical Exam    Patient: Tyler Roberts   DOB: 04-29-1955   67 y.o. Male  MRN: [id number] Visit Date: 04/14/2022  Today's healthcare provider: Lelon Huh, MD    Subjective    Tyler Roberts is a 67 y.o. male who presents today for a complete physical exam.  He reports consuming a general diet. The patient has a physically strenuous job, but has no regular exercise apart from work.  He generally feels well. He reports sleeping well.  He is also due to follow up on hypertension, hyperlipidemia, GERD and hypogonadism.   He is having no reflux symptoms. No chest pains, dyspnea, or palpitations. Is tolerating current medications well with no adverse effects and taking consistently. He feels more energetic and better sense of well being with current testosterone replacement.   Past Medical History:  Diagnosis Date   Actinic keratosis    Shingles 09/26/2014   Past Surgical History:  Procedure Laterality Date   HERNIA REPAIR  1990   KNEE SURGERY Right 2008   Due to a staph infection   LASIK     NOSE SURGERY  2002   TONSILLECTOMY AND ADENOIDECTOMY  2002   Social History   Socioeconomic History   Marital status: Married    Spouse name: Not on file   Number of children: 1   Years of education: H/S   Highest education level: Not on file  Occupational History   Occupation: Radiographer, therapeutic: [employer]  Tobacco Use   Smoking status: Former    Types: Cigars    Quit date: 04/05/2017    Years since quitting: 5.0   Smokeless tobacco: Never   Tobacco comments:    occasionally smoked intermittenlty for a few years  Substance and Sexual Activity   Alcohol use: Yes    Alcohol/week: 0.0 standard drinks of alcohol     Comment: Occasional   Drug use: No   Sexual activity: Not on file  Other Topics Concern   Not on file  Social History Narrative   Not on file   Social Determinants of Health   Financial Resource Strain: Not on file  Food Insecurity: Not on file  Transportation Needs: Not on file  Physical Activity: Not on file  Stress: Not on file  Social Connections: Not on file  Intimate Partner Violence: Not on file   Family Status  Relation Name Status   Mother  Deceased   Father  Deceased   Sister  Deceased   Brother  Alive   Sister  Alive   Sister  Alive   Family History  Problem Relation Age of Onset   Anuerysm Mother        Brain Aneurysm   Heart attack Father    Heart disease Father 24's   Ovarian cancer Sister    Healthy Brother    Healthy Sister    Healthy Sister    Allergies  Allergen Reactions   Amlodipine Swelling    Facial swelling   Atorvastatin     weakness   Fenofibrate     Other reaction(s): Muscle Pain Elevated CK   Gemfibrozil     Other reaction(s): Muscle Pain   Niacin     Flushing  Lotensin [Benazepril] Rash    Patient Care Team: [name], MD as PCP - General (Family Medicine) Brendolyn Patty, MD (Dermatology) Marlaine Hind, MD as Consulting Physician (Physical Medicine and Rehabilitation) Pa, [facility]   Medications: Outpatient Medications Prior to Visit  Medication Sig   aspirin 81 MG tablet    Clindamycin Phosphate foam Apply once a day to aa's as needed   clobetasol (TEMOVATE) 0.05 % external solution Apply 1-2 times daily to aa's as needed   esomeprazole (NEXIUM) 40 MG capsule TAKE 1 CAPSULE BY MOUTH ONCE DAILY   flunisolide (NASALIDE) 25 MCG/ACT (0.025%) SOLN USE 1 SPRAY IN EACH NOSTRIL DAILY AS DIRECTED   irbesartan-hydrochlorothiazide (AVALIDE) 150-12.5 MG tablet TAKE ONE TABLET EVERY DAY   loratadine (CLARITIN) 10 MG tablet Take 1 tablet (10 mg total) by mouth daily.   lovastatin (MEVACOR) 20 MG tablet TAKE 1  TABLET BY MOUTH DAILY   meloxicam (MOBIC) 15 MG tablet TAKE ONE TABLET BY MOUTH EVERY DAY WITH A MEAL   metoprolol succinate (TOPROL-XL) 25 MG 24 hr tablet TAKE 1/2 TABLET BY MOUTH DAILY   Testosterone 1.62 % GEL PLACE ONE (1) PUMP ONTO THE SKIN DAILY AS DIRECTED   triamcinolone cream (KENALOG) 0.1 % APPLY TO AFFECTED AREAS OF ITCHY RASH TOPICALLY ONCE TO TWICE DAILY UNITL CLEAR THEN AS NEEDED FOR FLARES. AVOID FACE,GROIN, AND AXILLA.   No facility-administered medications prior to visit.    Review of Systems  Constitutional:  Negative for chills, diaphoresis and fever.  HENT:  Negative for congestion, ear discharge, ear pain, hearing loss, nosebleeds, sore throat and tinnitus.   Eyes:  Negative for photophobia, pain, discharge and redness.  Respiratory:  Positive for apnea. Negative for cough, shortness of breath, wheezing and stridor.   Cardiovascular:  Negative for chest pain, palpitations and leg swelling.  Gastrointestinal:  Negative for abdominal pain, blood in stool, constipation, diarrhea, nausea and vomiting.  Endocrine: Negative for polydipsia.  Genitourinary:  Negative for dysuria, flank pain, frequency, hematuria and urgency.  Musculoskeletal:  Positive for back pain. Negative for myalgias and neck pain.  Skin:  Negative for rash.  Allergic/Immunologic: Negative for environmental allergies.  Neurological:  Negative for dizziness, tremors, seizures, weakness and headaches.  Hematological:  Does not bruise/bleed easily.  Psychiatric/Behavioral:  Negative for hallucinations and suicidal ideas. The patient is not nervous/anxious.   All other systems reviewed and are negative.   Last CBC Lab Results  Component Value Date   WBC 6.7 04/01/2021   HGB 14.5 04/01/2021   HCT 44.1 04/01/2021   MCV 89 04/01/2021   MCH 29.2 04/01/2021   RDW 12.7 04/01/2021   PLT 257 25/08/3974   Last metabolic panel Lab Results  Component Value Date   GLUCOSE 107 (H) 04/01/2021   NA 142  04/01/2021   K 4.4 04/01/2021   CL 102 04/01/2021   CO2 26 04/01/2021   BUN 15 04/01/2021   CREATININE 0.97 04/01/2021   EGFR 87 04/01/2021   CALCIUM 9.9 04/01/2021   PHOS 3.8 02/16/2017   PROT 6.9 04/01/2021   ALBUMIN 4.9 (H) 04/01/2021   LABGLOB 2.0 04/01/2021   AGRATIO 2.5 (H) 04/01/2021   BILITOT 1.0 04/01/2021   ALKPHOS 66 04/01/2021   AST 21 04/01/2021   ALT 29 04/01/2021   Last lipids Lab Results  Component Value Date   CHOL 177 04/01/2021   HDL 41 04/01/2021   LDLCALC 98 04/01/2021   TRIG 223 (H) 04/01/2021   CHOLHDL 4.3 04/01/2021  Last thyroid functions Lab Results  Component Value Date   TSH 1.78 12/15/2016   Testosterone  Date Value Ref Range Status  04/01/2021 127 (L) 264 - 916 ng/dL Final  03/05/2020 423 264 - 916 ng/dL Final  02/28/2019 328 264 - 916 ng/dL Final   Testosterone, Free  Date Value Ref Range Status  04/01/2021 4.1 (L) 6.6 - 18.1 pg/mL Final  03/05/2020 12.5 6.6 - 18.1 pg/mL Final  02/28/2019 11.6 6.6 - 18.1 pg/mL Final      Objective    BP (!) 139/91 (BP Location: Left Arm, Patient Position: Sitting, Cuff Size: Large)   Pulse 96   Temp 98.5 F (36.9 C) (Temporal)   Resp 16   Ht '5\' 10"'$  (1.778 m)   Wt 194 lb 14.4 oz (88.4 kg)   SpO2 100%   BMI 27.97 kg/m  BP Readings from Last 3 Encounters:  04/14/22 (!) 139/91  04/01/21 (!) 142/72  03/05/20 (!) 152/80   Wt Readings from Last 3 Encounters:  04/14/22 194 lb 14.4 oz (88.4 kg)  04/01/21 192 lb 4.8 oz (87.2 kg)  03/05/20 199 lb (90.3 kg)    Physical Exam   General Appearance:    Well developed, well nourished male. Alert, cooperative, in no acute distress, appears stated age  Head:    Normocephalic, without obvious abnormality, atraumatic  Eyes:    PERRL, conjunctiva/corneas clear, EOM's intact, fundi    benign, both eyes       Ears:    Normal TM's and external ear canals, both ears  Nose:   Nares normal, septum midline, mucosa normal, no drainage   or sinus tenderness   Throat:   Lips, mucosa, and tongue normal; teeth and gums normal  Neck:   Supple, symmetrical, trachea midline, no adenopathy;       thyroid:  No enlargement/tenderness/nodules; no carotid   bruit or JVD  Back:     Symmetric, no curvature, ROM normal, no CVA tenderness  Lungs:     Clear to auscultation bilaterally, respirations unlabored  Chest wall:    No tenderness or deformity  Heart:    Normal heart rate. Normal rhythm. No murmurs, rubs, or gallops.  S1 and S2 normal  Abdomen:     Soft, non-tender, bowel sounds active all four quadrants,    no masses, no organomegaly  Genitalia:    deferred  Rectal:    deferred  Extremities:   All extremities are intact. No cyanosis or edema  Pulses:   2+ and symmetric all extremities  Skin:   Skin color, texture, turgor normal, no rashes or lesions  Lymph nodes:   Cervical, supraclavicular, and axillary nodes normal  Neurologic:   CNII-XII intact. Normal strength, sensation and reflexes      throughout      Assessment & Plan     1. Annual physical exam He states he had colonoscopy done by Dr. Alice Reichert at [facility] in 2022. Will request copy of results.   2. Prostate cancer screening  - PSA Total (Reflex To Free) ([facility] only)  3. Need for influenza vaccination  - Flu Vaccine QUAD High Dose(Fluad)  4. Hypogonadism male Doing well with no adverse effects from current testosterone replacement.  - CBC - Testosterone,Free and Total  5. Hyperglyceridemia, pure He is tolerating lovastatin well with no adverse effects.   - Comprehensive metabolic panel - Lipid panel  6. Primary hypertension Not at goal. Increase metoprolol to 1 full tablet every day.  - Comprehensive  metabolic panel - Magnesium     The entirety of the information documented in the History of Present Illness, Review of Systems and Physical Exam were personally obtained by me. Portions of this information were initially documented by the CMA and reviewed  by me for thoroughness and accuracy.     Lelon Huh, MD  [facility] [phone] (phone) [phone] (fax)  [place]

## 2022-04-14 ENCOUNTER — Ambulatory Visit: Payer: Medicare PPO | Admitting: Family Medicine

## 2022-04-14 ENCOUNTER — Encounter: Payer: Self-pay | Admitting: Family Medicine

## 2022-04-14 VITALS — BP 139/91 | HR 96 | Temp 98.5°F | Resp 16 | Ht 70.0 in | Wt 194.9 lb

## 2022-04-14 DIAGNOSIS — Z23 Encounter for immunization: Secondary | ICD-10-CM | POA: Diagnosis not present

## 2022-04-14 DIAGNOSIS — E781 Pure hyperglyceridemia: Secondary | ICD-10-CM

## 2022-04-14 DIAGNOSIS — E291 Testicular hypofunction: Secondary | ICD-10-CM

## 2022-04-14 DIAGNOSIS — Z0000 Encounter for general adult medical examination without abnormal findings: Secondary | ICD-10-CM | POA: Diagnosis not present

## 2022-04-14 DIAGNOSIS — I10 Essential (primary) hypertension: Secondary | ICD-10-CM | POA: Diagnosis not present

## 2022-04-14 DIAGNOSIS — Z125 Encounter for screening for malignant neoplasm of prostate: Secondary | ICD-10-CM

## 2022-04-14 MED ORDER — METOPROLOL SUCCINATE ER 25 MG PO TB24: 25.0000 mg | ORAL_TABLET | Freq: Every day | ORAL | Status: DC

## 2022-04-14 NOTE — Patient Instructions (Signed)
.   Please review the attached list of medications and notify my office if there are any errors.   . Please bring all of your medications to every appointment so we can make sure that our medication list is the same as yours.   

## 2022-04-14 NOTE — Progress Notes (Signed)
Annual Wellness Visit     Patient: Tyler Roberts, Male    DOB: 26-Jun-1955, 67 y.o.   MRN: [id number] Visit Date: 04/14/2022  Today's Provider: Lelon Huh, MD    Subjective    CONNAR KEATING is a 67 y.o. male who presents today for his Annual Wellness Visit.    Medications: Outpatient Medications Prior to Visit  Medication Sig   aspirin 81 MG tablet    Clindamycin Phosphate foam Apply once a day to aa's as needed   clobetasol (TEMOVATE) 0.05 % external solution Apply 1-2 times daily to aa's as needed   esomeprazole (NEXIUM) 40 MG capsule TAKE 1 CAPSULE BY MOUTH ONCE DAILY   flunisolide (NASALIDE) 25 MCG/ACT (0.025%) SOLN USE 1 SPRAY IN EACH NOSTRIL DAILY AS DIRECTED   irbesartan-hydrochlorothiazide (AVALIDE) 150-12.5 MG tablet TAKE ONE TABLET EVERY DAY   loratadine (CLARITIN) 10 MG tablet Take 1 tablet (10 mg total) by mouth daily.   lovastatin (MEVACOR) 20 MG tablet TAKE 1 TABLET BY MOUTH DAILY   meloxicam (MOBIC) 15 MG tablet TAKE ONE TABLET BY MOUTH EVERY DAY WITH A MEAL   Testosterone 1.62 % GEL PLACE ONE (1) PUMP ONTO THE SKIN DAILY AS DIRECTED   triamcinolone cream (KENALOG) 0.1 % APPLY TO AFFECTED AREAS OF ITCHY RASH TOPICALLY ONCE TO TWICE DAILY UNITL CLEAR THEN AS NEEDED FOR FLARES. AVOID FACE,GROIN, AND AXILLA.   [DISCONTINUED] metoprolol succinate (TOPROL-XL) 25 MG 24 hr tablet TAKE 1/2 TABLET BY MOUTH DAILY   No facility-administered medications prior to visit.    Allergies  Allergen Reactions   Amlodipine Swelling    Facial swelling   Atorvastatin     weakness   Fenofibrate     Other reaction(s): Muscle Pain Elevated CK   Gemfibrozil     Other reaction(s): Muscle Pain   Niacin     Flushing   Lotensin [Benazepril] Rash    Patient Care Team: [name], MD as PCP - General (Family Medicine) Brendolyn Patty, MD (Dermatology) Marlaine Hind, MD as Consulting Physician (Physical Medicine and Rehabilitation) Pa, Purcell, MD as Consulting Physician (Gastroenterology)       Objective     Most recent functional status assessment:    04/14/2022    8:21 AM  In your present state of health, do you have any difficulty performing the following activities:  Hearing? 0  Vision? 0  Difficulty concentrating or making decisions? 0  Walking or climbing stairs? 0  Dressing or bathing? 0  Doing errands, shopping? 0   Most recent fall risk assessment:    04/14/2022    8:21 AM  Fall Risk   Falls in the past year? 0  Number falls in past yr: 0  Injury with Fall? 0  Risk for fall due to : No Fall Risks  Follow up Falls evaluation completed    Most recent depression screenings:    04/14/2022    8:21 AM 04/01/2021    9:22 AM  PHQ 2/9 Scores  PHQ - 2 Score 0 0  PHQ- 9 Score 0 3   Most recent cognitive screening:     No data to display         Most recent Audit-C alcohol use screening    04/14/2022    8:21 AM  Alcohol Use Disorder Test (AUDIT)  1. How often do you have a drink containing alcohol? 3  2. How many drinks containing alcohol do you have  on a typical day when you are drinking? 1  3. How often do you have six or more drinks on one occasion? 1  AUDIT-C Score 5   A score of 3 or more in women, and 4 or more in men indicates increased risk for alcohol abuse, EXCEPT if all of the points are from question 1   No results found for any visits on 04/14/22.  Assessment & Plan     Annual wellness visit done today including the all of the following: Reviewed patient's Family Medical History Reviewed and updated list of patient's medical providers Assessment of cognitive impairment was done Assessed patient's functional ability Established a written schedule for health screening Evarts Completed and Reviewed  Exercise Activities and Dietary recommendations  Goals   None     Immunization History  Administered Date(s) Administered   Fluad Quad(high  Dose 65+) 04/01/2021   Influenza,inj,Quad PF,6+ Mos 12/22/2017, 01/26/2019, 03/05/2020   Influenza-Unspecified 01/17/2017   PFIZER(Purple Top)SARS-COV-2 Vaccination 07/12/2019, 08/07/2019, 03/05/2020   PNEUMOCOCCAL CONJUGATE-20 04/01/2021   Td 12/22/2017   Tdap 07/25/2007   Zoster Recombinat (Shingrix) 12/15/2016, 02/16/2017    Health Maintenance  Topic Date Due   COLONOSCOPY (Pts 45-60yr Insurance coverage will need to be confirmed)  12/06/2020   INFLUENZA VACCINE  11/03/2021   COVID-19 Vaccine (4 - 2023-24 season) 12/04/2021   Medicare Annual Wellness (AWV)  04/01/2022   DTaP/Tdap/Td (3 - Td or Tdap) 12/23/2027   Pneumonia Vaccine 67 Years old  Completed   Hepatitis C Screening  Completed   Zoster Vaccines- Shingrix  Completed   HPV VACCINES  Aged Out    He states he [facility] Discussed health benefits of physical activity, and encouraged him to engage in regular exercise appropriate for his age and condition.         The entirety of the information documented in the History of Present Illness, Review of Systems and Physical Exam were personally obtained by me. Portions of this information were initially documented by the CMA and reviewed by me for thoroughness and accuracy.     DLelon Huh MD  [phone](phone) [phone](fax)  [place]

## 2022-04-16 ENCOUNTER — Other Ambulatory Visit: Payer: Self-pay | Admitting: Family Medicine

## 2022-04-16 ENCOUNTER — Telehealth: Payer: Self-pay | Admitting: Family Medicine

## 2022-04-16 DIAGNOSIS — I10 Essential (primary) hypertension: Secondary | ICD-10-CM

## 2022-04-16 DIAGNOSIS — E349 Endocrine disorder, unspecified: Secondary | ICD-10-CM

## 2022-04-16 MED ORDER — METOPROLOL SUCCINATE ER 25 MG PO TB24: 25.0000 mg | ORAL_TABLET | Freq: Every day | ORAL | 1 refills | Status: DC

## 2022-04-16 NOTE — Telephone Encounter (Signed)
Patients wife Freda Munro is wanting to let Dr.Fisher know that the patient is wanting to keep his current medication Testosterone 1.62 % GEL . Patient is not wanting to change medications.

## 2022-04-16 NOTE — Telephone Encounter (Addendum)
Medication Refill - Medication: metoprolol succinate (TOPROL-XL) 25 MG 24 hr tablet    Pt is out of medication.   Has the patient contacted their pharmacy? Yes.    Pt referred to PCP  Preferred Pharmacy (with phone number or street name):  [place], [place] [address] Phone: [phone]  Fax: [phone]     Has the patient been seen for an appointment in the last year OR does the patient have an upcoming appointment? Yes.    Agent: Please be advised that RX refills may take up to 3 business days. We ask that you follow-up with your pharmacy.

## 2022-04-16 NOTE — Telephone Encounter (Signed)
Requested medication (s) are due for refill today - no  Requested medication (s) are on the active medication list -yes  Future visit scheduled -yes  Last refill: 04/14/21  Notes to clinic: original Rx sent as "no print" request correction and resend for patient  Requested Prescriptions  Pending Prescriptions Disp Refills   metoprolol succinate (TOPROL-XL) 25 MG 24 hr tablet      Sig: Take 1 tablet (25 mg total) by mouth daily.     Cardiovascular:  Beta Blockers Failed - 04/16/2022 11:02 AM      Failed - Last BP in normal range    BP Readings from Last 1 Encounters:  04/14/22 (!) 139/91         Passed - Last Heart Rate in normal range    Pulse Readings from Last 1 Encounters:  04/14/22 96         Passed - Valid encounter within last 6 months    Recent Outpatient Visits           2 days ago Annual physical exam   [facility] [name], MD   1 year ago Encounter for initial preventive physical examination covered by [facility] [name], MD   2 years ago Annual physical exam   [facility] [name], MD   3 years ago Annual physical exam   [facility] [name], MD   3 years ago Abscess   [facility] [place], [place]       Future Appointments             In 3 months Brendolyn Patty, MD [place]   In 3 months Fisher, Kirstie Peri, MD [facility], [facility]               Requested Prescriptions  Pending Prescriptions Disp Refills   metoprolol succinate (TOPROL-XL) 25 MG 24 hr tablet      Sig: Take 1 tablet (25 mg total) by mouth daily.     Cardiovascular:  Beta Blockers Failed - 04/16/2022 11:02 AM      Failed - Last BP in normal range    BP Readings from Last 1 Encounters:  04/14/22 (!) 139/91         Passed - Last Heart Rate in normal range    Pulse Readings from Last 1 Encounters:  04/14/22 96         Passed -  Valid encounter within last 6 months    Recent Outpatient Visits           2 days ago Annual physical exam   [facility] [name], MD   1 year ago Encounter for initial preventive physical examination covered by [facility] [name], MD   2 years ago Annual physical exam   [facility] [name], MD   3 years ago Annual physical exam   [facility] [name], MD   3 years ago [place], [place], [place]       Future Appointments             In 3 months Brendolyn Patty, MD [place]   In 3 months Fisher, Kirstie Peri, MD [facility], [place]

## 2022-04-17 MED ORDER — TESTOSTERONE 1.62 % TD GEL: TRANSDERMAL | 2 refills | Status: DC

## 2022-04-22 LAB — COMPREHENSIVE METABOLIC PANEL
ALT: 31 IU/L (ref 0–44)
AST: 26 IU/L (ref 0–40)
Albumin/Globulin Ratio: 2.2 (ref 1.2–2.2)
Albumin: 4.6 g/dL (ref 3.9–4.9)
Alkaline Phosphatase: 60 IU/L (ref 44–121)
BUN/Creatinine Ratio: 18 (ref 10–24)
BUN: 16 mg/dL (ref 8–27)
Bilirubin Total: 1 mg/dL (ref 0.0–1.2)
CO2: 22 mmol/L (ref 20–29)
Calcium: 9.8 mg/dL (ref 8.6–10.2)
Chloride: 102 mmol/L (ref 96–106)
Creatinine, Ser: 0.91 mg/dL (ref 0.76–1.27)
Globulin, Total: 2.1 g/dL (ref 1.5–4.5)
Glucose: 114 mg/dL — ABNORMAL HIGH (ref 70–99)
Potassium: 4.6 mmol/L (ref 3.5–5.2)
Sodium: 143 mmol/L (ref 134–144)
Total Protein: 6.7 g/dL (ref 6.0–8.5)
eGFR: 93 mL/min/{1.73_m2} (ref >59)

## 2022-04-22 LAB — CBC
Hematocrit: 44.2 % (ref 37.5–51.0)
Hemoglobin: 14.8 g/dL (ref 13.0–17.7)
MCH: 29.9 pg (ref 26.6–33.0)
MCHC: 33.5 g/dL (ref 31.5–35.7)
MCV: 89 fL (ref 79–97)
Platelets: 249 10*3/uL (ref 150–450)
RBC: 4.95 x10E6/uL (ref 4.14–5.80)
RDW: 12.9 % (ref 11.6–15.4)
WBC: 6.8 10*3/uL (ref 3.4–10.8)

## 2022-04-22 LAB — LIPID PANEL
Chol/HDL Ratio: 4.8 ratio (ref 0.0–5.0)
Cholesterol, Total: 184 mg/dL (ref 100–199)
HDL: 38 mg/dL — ABNORMAL LOW (ref >39)
LDL Chol Calc (NIH): 89 mg/dL (ref 0–99)
Triglycerides: 349 mg/dL — ABNORMAL HIGH (ref 0–149)
VLDL Cholesterol Cal: 57 mg/dL — ABNORMAL HIGH (ref 5–40)

## 2022-04-22 LAB — TESTOSTERONE,FREE AND TOTAL
Testosterone, Free: 5.3 pg/mL — ABNORMAL LOW (ref 6.6–18.1)
Testosterone: 228 ng/dL — ABNORMAL LOW (ref 264–916)

## 2022-04-22 LAB — MAGNESIUM: Magnesium: 2.2 mg/dL (ref 1.6–2.3)

## 2022-04-22 LAB — PSA TOTAL (REFLEX TO FREE): Prostate Specific Ag, Serum: 0.7 ng/mL (ref 0.0–4.0)

## 2022-04-25 ENCOUNTER — Encounter: Payer: Self-pay | Admitting: Family Medicine

## 2022-04-28 ENCOUNTER — Other Ambulatory Visit: Payer: Self-pay | Admitting: Family Medicine

## 2022-04-28 DIAGNOSIS — E781 Pure hyperglyceridemia: Secondary | ICD-10-CM

## 2022-06-03 ENCOUNTER — Telehealth: Payer: Self-pay | Admitting: Family Medicine

## 2022-06-03 NOTE — Telephone Encounter (Signed)
Medication Refill - Medication: Meloxicam 15 mg  Has the patient contacted their pharmacy? Yes.  Their e-scribe is down.  Pharmacy asked that it be faxed or called iv  (Agent: If no, request that the patient contact the pharmacy for the refill. If patient does not wish to contact the pharmacy document the reason why and proceed with request.) (Agent: If yes, when and what did the pharmacy advise?)  Preferred Pharmacy (with phone number or street name): [place] Has the patient been seen for an appointment in the last year OR does the patient have an upcoming appointment? Yes.    Agent: Please be advised that RX refills may take up to 3 business days. We ask that you follow-up with your pharmacy.

## 2022-06-04 NOTE — Telephone Encounter (Signed)
[facility]

## 2022-06-04 NOTE — Addendum Note (Signed)
Addended by: [name] on: 06/04/2022 10:04 AM   Modules accepted: Orders

## 2022-06-04 NOTE — Telephone Encounter (Signed)
Unable to refill per protocol, Rx request is too soon. Last refill 04/27/21 for 90 days and 4 refills.  Requested Prescriptions  Pending Prescriptions Disp Refills   meloxicam (MOBIC) 15 MG tablet 90 tablet 4    Sig: TAKE ONE TABLET BY MOUTH EVERY DAY WITH A MEAL     Analgesics:  COX2 Inhibitors Failed - 06/03/2022 12:52 PM      Failed - Manual Review: Labs are only required if the patient has taken medication for more than 8 weeks.      Passed - HGB in normal range and within 360 days    Hemoglobin  Date Value Ref Range Status  04/14/2022 14.8 13.0 - 17.7 g/dL Final         Passed - Cr in normal range and within 360 days    Creat  Date Value Ref Range Status  02/16/2017 0.82 0.70 - 1.25 mg/dL Final    Comment:    For patients >54 years of age, the reference limit for Creatinine is approximately 13% higher for people identified as African-American. .    Creatinine, Ser  Date Value Ref Range Status  04/14/2022 0.91 0.76 - 1.27 mg/dL Final         Passed - HCT in normal range and within 360 days    Hematocrit  Date Value Ref Range Status  04/14/2022 44.2 37.5 - 51.0 % Final         Passed - AST in normal range and within 360 days    AST  Date Value Ref Range Status  04/14/2022 26 0 - 40 IU/L Final         Passed - ALT in normal range and within 360 days    ALT  Date Value Ref Range Status  04/14/2022 31 0 - 44 IU/L Final         Passed - eGFR is 30 or above and within 360 days    GFR, Est African American  Date Value Ref Range Status  12/15/2016 108 > OR = 60 mL/min/1.69m Final   GFR calc Af Amer  Date Value Ref Range Status  03/05/2020 103 >59 mL/min/1.73 Final    Comment:    **In accordance with recommendations from the NKF-ASN Task force,**   [facility] is in the process of updating its eGFR calculation to the   2021 CKD-EPI creatinine equation that estimates kidney function   without a race variable.    GFR, Est Non African American  Date Value Ref  Range Status  12/15/2016 93 > OR = 60 mL/min/1.77mFinal   GFR calc non Af Amer  Date Value Ref Range Status  03/05/2020 89 >59 mL/min/1.73 Final   eGFR  Date Value Ref Range Status  04/14/2022 93 >59 mL/min/1.73 Final         Passed - Patient is not pregnant      Passed - Valid encounter within last 12 months    Recent Outpatient Visits           1 month ago [facility]   1 year ago [facility] for initial preventive physical examination [facility]   [facility]   [facility]  3 years ago [place], [place], [place]       Future Appointments             In 2 months Brendolyn Patty, MD [place]   In 2 months Fisher, Kirstie Peri, MD [facility], [facility]

## 2022-06-07 ENCOUNTER — Other Ambulatory Visit: Payer: Self-pay

## 2022-06-07 MED ORDER — MELOXICAM 15 MG PO TABS: ORAL_TABLET | ORAL | 4 refills | Status: DC

## 2022-06-20 ENCOUNTER — Other Ambulatory Visit: Payer: Self-pay | Admitting: Family Medicine

## 2022-06-20 DIAGNOSIS — K219 Gastro-esophageal reflux disease without esophagitis: Secondary | ICD-10-CM

## 2022-07-24 ENCOUNTER — Other Ambulatory Visit: Payer: Self-pay | Admitting: Family Medicine

## 2022-07-24 DIAGNOSIS — E781 Pure hyperglyceridemia: Secondary | ICD-10-CM

## 2022-08-10 ENCOUNTER — Ambulatory Visit: Payer: Medicare PPO | Admitting: Dermatology

## 2022-08-10 VITALS — BP 166/93 | HR 92

## 2022-08-10 DIAGNOSIS — L578 Other skin changes due to chronic exposure to nonionizing radiation: Secondary | ICD-10-CM

## 2022-08-10 DIAGNOSIS — Z872 Personal history of diseases of the skin and subcutaneous tissue: Secondary | ICD-10-CM

## 2022-08-10 DIAGNOSIS — L570 Actinic keratosis: Secondary | ICD-10-CM

## 2022-08-10 DIAGNOSIS — L814 Other melanin hyperpigmentation: Secondary | ICD-10-CM

## 2022-08-10 DIAGNOSIS — W908XXA Exposure to other nonionizing radiation, initial encounter: Secondary | ICD-10-CM | POA: Diagnosis not present

## 2022-08-10 DIAGNOSIS — Z7189 Other specified counseling: Secondary | ICD-10-CM | POA: Diagnosis not present

## 2022-08-10 DIAGNOSIS — X32XXXA Exposure to sunlight, initial encounter: Secondary | ICD-10-CM

## 2022-08-10 NOTE — Progress Notes (Signed)
Follow-Up Visit   Subjective  Tyler Roberts is a 67 y.o. male who presents for the following: Actinic keratosis follow-up. Recheck hypertrophic AK of the right mid ear helix. No history of skin cancer.     The following portions of the chart were reviewed this encounter and updated as appropriate: medications, allergies, medical history  Review of Systems:  No other skin or systemic complaints except as noted in HPI or Assessment and Plan.  Objective  Well appearing patient in no apparent distress; mood and affect are within normal limits.  A focused examination was performed of the following areas: Face, ears  Relevant exam findings are noted in the Assessment and Plan.  R preauricular x 4; L nasal dorsum x 1 (5) Pink scaly macules.     Assessment & Plan   AK (actinic keratosis) (5) R preauricular x 4; L nasal dorsum x 1  Recheck L nasal dorsum on f/u.  Actinic keratoses are precancerous spots that appear secondary to cumulative UV radiation exposure/sun exposure over time. They are chronic with expected duration over 1 year. A portion of actinic keratoses will progress to squamous cell carcinoma of the skin. It is not possible to reliably predict which spots will progress to skin cancer and so treatment is recommended to prevent development of skin cancer.  Recommend daily broad spectrum sunscreen SPF 30+ to sun-exposed areas, reapply every 2 hours as needed.  Recommend staying in the shade or wearing long sleeves, sun glasses (UVA+UVB protection) and wide brim hats (4-inch brim around the entire circumference of the hat). Call for new or changing lesions.  Destruction of lesion - R preauricular x 4; L nasal dorsum x 1  Destruction method: cryotherapy   Informed consent: discussed and consent obtained   Lesion destroyed using liquid nitrogen: Yes   Region frozen until ice ball extended beyond lesion: Yes   Outcome: patient tolerated procedure well with no complications    Post-procedure details: wound care instructions given   Additional details:  Prior to procedure, discussed risks of blister formation, small wound, skin dyspigmentation, or rare scar following cryotherapy. Recommend Vaseline ointment to treated areas while healing.   ACTINIC DAMAGE WITH PRECANCEROUS ACTINIC KERATOSES Counseling for Topical Chemotherapy Management: Patient exhibits: - Severe, confluent actinic changes with pre-cancerous actinic keratoses that is secondary to cumulative UV radiation exposure over time - Condition that is severe; chronic, not at goal. - diffuse scaly erythematous macules and papules with underlying dyspigmentation - Discussed Prescription "Field Treatment" topical Chemotherapy for Severe, Chronic Confluent Actinic Changes with Pre-Cancerous Actinic Keratoses Field treatment involves treatment of an entire area of skin that has confluent Actinic Changes (Sun/ Ultraviolet light damage) and PreCancerous Actinic Keratoses by method of PhotoDynamic Therapy (PDT) and/or prescription Topical Chemotherapy agents such as 5-fluorouracil, 5-fluorouracil/calcipotriene, and/or imiquimod.  The purpose is to decrease the number of clinically evident and subclinical PreCancerous lesions to prevent progression to development of skin cancer by chemically destroying early precancer changes that may or may not be visible.  It has been shown to reduce the risk of developing skin cancer in the treated area. As a result of treatment, redness, scaling, crusting, and open sores may occur during treatment course. One or more than one of these methods may be used and may have to be used several times to control, suppress and eliminate the PreCancerous changes. Discussed treatment course, expected reaction, and possible side effects. - Recommend daily broad spectrum sunscreen SPF 30+ to sun-exposed areas, reapply every 2  hours as needed.  - Staying in the shade or wearing long sleeves, sun glasses  (UVA+UVB protection) and wide brim hats (4-inch brim around the entire circumference of the hat) are also recommended. - Call for new or changing lesions. -Plan on Red light w/debridement to face this fall/winter.  LENTIGINES Exam: scattered tan macules Due to sun exposure Treatment Plan: Benign-appearing, observe. Recommend daily broad spectrum sunscreen SPF 30+ to sun-exposed areas, reapply every 2 hours as needed.  Call for any changes  HISTORY OF PRECANCEROUS ACTINIC KERATOSIS - site(s) of PreCancerous Actinic Keratosis clear today R ear helix - these may recur and new lesions may form requiring treatment to prevent transformation into skin cancer - observe for new or changing spots and contact [facility] for appointment if occur - photoprotection with sun protective clothing; sunglasses and broad spectrum sunscreen with SPF of at least 30 + and frequent self skin exams recommended - yearly exams by a dermatologist recommended for persons with history of PreCancerous Actinic Keratoses   Return in about 6 months (around 02/10/2023) for UBSE, Hx AKs. Recheck L nasal dorsum. Red light w/debridement to face this fall/winter.Wendee Beavers, CMA, am acting as scribe for Willeen Niece, MD .   Documentation: I have reviewed the above documentation for accuracy and completeness, and I agree with the above.  Willeen Niece, MD

## 2022-08-10 NOTE — Patient Instructions (Addendum)
Cryotherapy Aftercare  Wash gently with soap and water everyday.   Apply Vaseline and Band-Aid daily until healed.     Due to recent changes in healthcare laws, you may see results of your pathology and/or laboratory studies on MyChart before the doctors have had a chance to review them. We understand that in some cases there may be results that are confusing or concerning to you. Please understand that not all results are received at the same time and often the doctors may need to interpret multiple results in order to provide you with the best plan of care or course of treatment. Therefore, we ask that you please give us 2 business days to thoroughly review all your results before contacting the office for clarification. Should we see a critical lab result, you will be contacted sooner.   If You Need Anything After Your Visit  If you have any questions or concerns for your doctor, please call our main line at [phone] and press option 4 to reach your doctor's medical assistant. If no one answers, please leave a voicemail as directed and we will return your call as soon as possible. Messages left after 4 pm will be answered the following business day.   You may also send us a message via MyChart. We typically respond to MyChart messages within 1-2 business days.  For prescription refills, please ask your pharmacy to contact our office. Our fax number is [phone].  If you have an urgent issue when the clinic is closed that cannot wait until the next business day, you can page your doctor at the number below.    Please note that while we do our best to be available for urgent issues outside of office hours, we are not available 24/7.   If you have an urgent issue and are unable to reach us, you may choose to seek medical care at your doctor's office, retail clinic, urgent care center, or emergency room.  If you have a medical emergency, please immediately call 911 or go to the  emergency department.  Pager Numbers  - Dr. Kowalski: [phone]  - Dr. Moye: [phone]  - Dr. Stewart: [phone]  In the event of inclement weather, please call our main line at [phone] for an update on the status of any delays or closures.  Dermatology Medication Tips: Please keep the boxes that topical medications come in in order to help keep track of the instructions about where and how to use these. Pharmacies typically print the medication instructions only on the boxes and not directly on the medication tubes.   If your medication is too expensive, please contact our office at [phone] option 4 or send us a message through MyChart.   We are unable to tell what your co-pay for medications will be in advance as this is different depending on your insurance coverage. However, we may be able to find a substitute medication at lower cost or fill out paperwork to get insurance to cover a needed medication.   If a prior authorization is required to get your medication covered by your insurance company, please allow us 1-2 business days to complete this process.  Drug prices often vary depending on where the prescription is filled and some pharmacies may offer cheaper prices.  The website www.goodrx.com contains coupons for medications through different pharmacies. The prices here do not account for what the cost may be with help from insurance (it may be cheaper with your insurance), but the website can   give you the price if you did not use any insurance.  - You can print the associated coupon and take it with your prescription to the pharmacy.  - You may also stop by our office during regular business hours and pick up a GoodRx coupon card.  - If you need your prescription sent electronically to a different pharmacy, notify our office through [facility] MyChart or by phone at [phone] option 4.     Si Usted Necesita Algo Despus de Su Visita  Tambin puede  enviarnos un mensaje a travs de MyChart. Por lo general respondemos a los mensajes de MyChart en el transcurso de 1 a 2 das hbiles.  Para renovar recetas, por favor pida a su farmacia que se ponga en contacto con nuestra oficina. Nuestro nmero de fax es el [phone].  Si tiene un asunto urgente cuando la clnica est cerrada y que no puede esperar hasta el siguiente da hbil, puede llamar/localizar a su doctor(a) al nmero que aparece a continuacin.   Por favor, tenga en cuenta que aunque hacemos todo lo posible para estar disponibles para asuntos urgentes fuera del horario de oficina, no estamos disponibles las 24 horas del da, los 7 das de la semana.   Si tiene un problema urgente y no puede comunicarse con nosotros, puede optar por buscar atencin mdica  en el consultorio de su doctor(a), en una clnica privada, en un centro de atencin urgente o en una sala de emergencias.  Si tiene una emergencia mdica, por favor llame inmediatamente al 911 o vaya a la sala de emergencias.  Nmeros de bper  - Dr. Kowalski: [phone]  - Dra. Moye: [phone]  - Dra. Stewart: [phone]  En caso de inclemencias del tiempo, por favor llame a nuestra lnea principal al [phone] para una actualizacin sobre el estado de cualquier retraso o cierre.  Consejos para la medicacin en dermatologa: Por favor, guarde las cajas en las que vienen los medicamentos de uso tpico para ayudarle a seguir las instrucciones sobre dnde y cmo usarlos. Las farmacias generalmente imprimen las instrucciones del medicamento slo en las cajas y no directamente en los tubos del medicamento.   Si su medicamento es muy caro, por favor, pngase en contacto con nuestra oficina llamando al [phone] y presione la opcin 4 o envenos un mensaje a travs de MyChart.   No podemos decirle cul ser su copago por los medicamentos por adelantado ya que esto es diferente dependiendo de la cobertura de su seguro.  Sin embargo, es posible que podamos encontrar un medicamento sustituto a menor costo o llenar un formulario para que el seguro cubra el medicamento que se considera necesario.   Si se requiere una autorizacin previa para que su compaa de seguros cubra su medicamento, por favor permtanos de 1 a 2 das hbiles para completar este proceso.  Los precios de los medicamentos varan con frecuencia dependiendo del lugar de dnde se surte la receta y alguna farmacias pueden ofrecer precios ms baratos.  El sitio web www.goodrx.com tiene cupones para medicamentos de diferentes farmacias. Los precios aqu no tienen en cuenta lo que podra costar con la ayuda del seguro (puede ser ms barato con su seguro), pero el sitio web puede darle el precio si no utiliz ningn seguro.  - Puede imprimir el cupn correspondiente y llevarlo con su receta a la farmacia.  - Tambin puede pasar por nuestra oficina durante el horario de atencin regular y recoger una tarjeta de cupones de GoodRx.  -   Si necesita que su receta se enve electrnicamente a una farmacia diferente, informe a nuestra oficina a travs de MyChart de [place] o por telfono llamando al [phone] y presione la opcin 4.  

## 2022-08-13 ENCOUNTER — Other Ambulatory Visit: Payer: Self-pay | Admitting: Family Medicine

## 2022-08-13 ENCOUNTER — Ambulatory Visit: Payer: Medicare PPO | Admitting: Family Medicine

## 2022-08-13 DIAGNOSIS — K219 Gastro-esophageal reflux disease without esophagitis: Secondary | ICD-10-CM

## 2022-08-13 NOTE — Telephone Encounter (Signed)
Requested Prescriptions  Pending Prescriptions Disp Refills   esomeprazole (NEXIUM) 40 MG capsule [Pharmacy Med Name: ESOMEPRAZOLE MAGNESIUM 40 MG CAP] 30 capsule 2    Sig: TAKE 1 CAPSULE BY MOUTH ONCE DAILY     Gastroenterology: Proton Pump Inhibitors 2 Passed - 08/13/2022 10:46 AM      Passed - ALT in normal range and within 360 days    ALT  Date Value Ref Range Status  04/14/2022 31 0 - 44 IU/L Final         Passed - AST in normal range and within 360 days    AST  Date Value Ref Range Status  04/14/2022 26 0 - 40 IU/L Final         Passed - Valid encounter within last 12 months    Recent Outpatient Visits           4 months ago Annual physical exam   [facility] Malva Limes, [facility]   1 year ago Encounter for initial preventive physical examination covered by [facility]   [facility] Malva Limes, MD   2 years ago Annual physical exam   [facility] Malva Limes, MD   3 years ago Annual physical exam   [facility], [facility]   3 years ago Abscess   [facility] Trey Sailors, [place]       Future Appointments             In 2 weeks Sherrie Mustache, Demetrios Isaacs, MD [facility]   In 6 months Willeen Niece, MD [facility]

## 2022-08-26 NOTE — Progress Notes (Signed)
I,Sulibeya S Dimas,acting as a scribe for Mila Merry, MD.,have documented all relevant documentation on the behalf of Mila Merry, MD,as directed by  Mila Merry, MD   Established patient visit   Patient: Tyler Roberts   DOB: [birth year]   67 y.o. Male  MRN: [id number] Visit Date: 08/27/2022  Today's healthcare provider: Mila Merry, MD   Chief Complaint  Patient presents with   Hypertension   Subjective    HPI  Hypertension, follow-up  BP Readings from Last 3 Encounters:  08/27/22 (!) 153/77  08/10/22 (!) 166/93  04/14/22 (!) 139/91   Wt Readings from Last 3 Encounters:  08/27/22 192 lb 12.8 oz (87.5 kg)  04/14/22 194 lb 14.4 oz (88.4 kg)  04/01/21 192 lb 4.8 oz (87.2 kg)     He was last seen for hypertension 4 months ago.  BP at that visit was 139/91. Management since that visit includes increase metoprolol to 1 full tablet daily.  He reports excellent compliance with treatment. He is not having side effects.    Outside blood pressures are 130/70s checked by his wife who is a Engineer, civil (consulting).   Pertinent labs Lab Results  Component Value Date   CHOL 184 04/14/2022   HDL 38 (L) 04/14/2022   LDLCALC 89 04/14/2022   TRIG 349 (H) 04/14/2022   CHOLHDL 4.8 04/14/2022   Lab Results  Component Value Date   NA 143 04/14/2022   K 4.6 04/14/2022   CREATININE 0.91 04/14/2022   EGFR 93 04/14/2022   GLUCOSE 114 (H) 04/14/2022   TSH 1.78 12/15/2016     The 10-year ASCVD risk score (Arnett DK, et al., 2019) is: 23.2%  ---------------------------------------------------------------------------------------------------   Medications: Outpatient Medications Prior to Visit  Medication Sig   aspirin 81 MG tablet    esomeprazole (NEXIUM) 40 MG capsule TAKE 1 CAPSULE BY MOUTH ONCE DAILY   flunisolide (NASALIDE) 25 MCG/ACT (0.025%) SOLN USE 1 SPRAY IN EACH NOSTRIL DAILY AS DIRECTED   irbesartan-hydrochlorothiazide (AVALIDE) 150-12.5 MG tablet TAKE ONE TABLET EVERY DAY    loratadine (CLARITIN) 10 MG tablet Take 1 tablet (10 mg total) by mouth daily.   lovastatin (MEVACOR) 20 MG tablet TAKE 1 TABLET BY MOUTH DAILY   meloxicam (MOBIC) 15 MG tablet TAKE ONE TABLET BY MOUTH EVERY DAY WITH A MEAL   metoprolol succinate (TOPROL-XL) 25 MG 24 hr tablet Take 1 tablet (25 mg total) by mouth daily.   Testosterone 1.62 % GEL PLACE ONE (1) PUMP ONTO THE SKIN DAILY AS DIRECTED   triamcinolone cream (KENALOG) 0.1 % APPLY TO AFFECTED AREAS OF ITCHY RASH TOPICALLY ONCE TO TWICE DAILY UNITL CLEAR THEN AS NEEDED FOR FLARES. AVOID FACE,GROIN, AND AXILLA.   No facility-administered medications prior to visit.    Review of Systems  Constitutional:  Negative for fatigue.  Eyes:  Negative for visual disturbance.  Respiratory:  Negative for cough, chest tightness and shortness of breath.   Cardiovascular:  Negative for chest pain and leg swelling.  Neurological:  Negative for dizziness, light-headedness and headaches.       Objective    BP (!) 153/77 (BP Location: Left Arm, Patient Position: Sitting, Cuff Size: Large)   Pulse 90   Temp 98.2 F (36.8 C) (Temporal)   Resp 20   Wt 192 lb 12.8 oz (87.5 kg)   SpO2 98%   BMI 27.66 kg/m  BP Readings from Last 3 Encounters:  08/27/22 (!) 153/77  08/10/22 (!) 166/93  04/14/22 (!) 139/91   Wt  Readings from Last 3 Encounters:  08/27/22 192 lb 12.8 oz (87.5 kg)  04/14/22 194 lb 14.4 oz (88.4 kg)  04/01/21 192 lb 4.8 oz (87.2 kg)    Physical Exam   General: Appearance:    Well developed, well nourished male in no acute distress  Eyes:    PERRL, conjunctiva/corneas clear, EOM's intact       Lungs:     Clear to auscultation bilaterally, respirations unlabored  Heart:    Normal heart rate. Normal rhythm. No murmurs, rubs, or gallops.    MS:   All extremities are intact.    Neurologic:   Awake, alert, oriented x 3. No apparent focal neurological defect.        Results for orders placed or performed in visit on 08/27/22   POCT glycosylated hemoglobin (Hb A1C)  Result Value Ref Range   Hemoglobin A1C 5.4 4.0 - 5.6 %   Est. average glucose Bld gHb Est-mCnc 108     Assessment & Plan     1. Primary hypertension Tolerating increased dose of metoprolol well without adverse effects, home BP checked by his wife, who is a nurse, are normal.   2. Hyperglycemia A1c is ok, is working on avoiding sweets and starchy foods in diet.       The entirety of the information documented in the History of Present Illness, Review of Systems and Physical Exam were personally obtained by me. Portions of this information were initially documented by the CMA and reviewed by me for thoroughness and accuracy.     Mila Merry, MD  [facility] [phone] (phone) [phone] (fax)  [facility]

## 2022-08-27 ENCOUNTER — Ambulatory Visit: Payer: Medicare PPO | Admitting: Family Medicine

## 2022-08-27 ENCOUNTER — Encounter: Payer: Self-pay | Admitting: Family Medicine

## 2022-08-27 VITALS — BP 146/78 | HR 90 | Temp 98.2°F | Resp 20 | Wt 192.8 lb

## 2022-08-27 DIAGNOSIS — I10 Essential (primary) hypertension: Secondary | ICD-10-CM

## 2022-08-27 DIAGNOSIS — R739 Hyperglycemia, unspecified: Secondary | ICD-10-CM

## 2022-08-27 LAB — POCT GLYCOSYLATED HEMOGLOBIN (HGB A1C)
Est. average glucose Bld gHb Est-mCnc: 108
Hemoglobin A1C: 5.4 % (ref 4.0–5.6)

## 2022-08-27 NOTE — Patient Instructions (Signed)
.   Please review the attached list of medications and notify my office if there are any errors.   . Please bring all of your medications to every appointment so we can make sure that our medication list is the same as yours.   

## 2022-09-06 DIAGNOSIS — M65842 Other synovitis and tenosynovitis, left hand: Secondary | ICD-10-CM | POA: Diagnosis not present

## 2022-09-14 DIAGNOSIS — G4733 Obstructive sleep apnea (adult) (pediatric): Secondary | ICD-10-CM | POA: Diagnosis not present

## 2022-10-15 ENCOUNTER — Other Ambulatory Visit: Payer: Self-pay | Admitting: Family Medicine

## 2022-10-15 DIAGNOSIS — I10 Essential (primary) hypertension: Secondary | ICD-10-CM

## 2022-10-27 ENCOUNTER — Ambulatory Visit: Payer: Medicare PPO | Admitting: Family Medicine

## 2022-11-10 ENCOUNTER — Other Ambulatory Visit: Payer: Self-pay | Admitting: Family Medicine

## 2022-11-10 DIAGNOSIS — E349 Endocrine disorder, unspecified: Secondary | ICD-10-CM

## 2022-11-11 NOTE — Telephone Encounter (Signed)
Requested medication (s) are due for refill today: routing for review  Requested medication (s) are on the active medication list: yes  Last refill:  04/17/22  Future visit scheduled: yes  Notes to clinic:  Medication not assigned to a protocol, review manually.      Requested Prescriptions  Pending Prescriptions Disp Refills   Testosterone 1.62 % GEL [Pharmacy Med Name: TESTOSTERONE 1.62% GEL GM] 150 g     Sig: PLACE ONE PUMP ONTO THE SKIN DAILY AS DIRECTED     Off-Protocol Failed - 11/10/2022  1:01 PM      Failed - Medication not assigned to a protocol, review manually.      Passed - Valid encounter within last 12 months    Recent Outpatient Visits           2 months ago Primary hypertension   [facility] Malva Limes, MD   7 months ago Annual physical exam   [facility] Malva Limes, MD   1 year ago Encounter for initial preventive physical examination covered by [facility]   [facility] Mal[facility] Limes, [facility]   2 years ago Annual physical exam   [facility] Malva Limes, MD   3 years ago Annual physical exam   [facility] Malva Limes, MD       Future Appointments             In 3 months Willeen Niece, MD [facility]

## 2022-12-03 DIAGNOSIS — M5416 Radiculopathy, lumbar region: Secondary | ICD-10-CM | POA: Diagnosis not present

## 2022-12-06 ENCOUNTER — Other Ambulatory Visit: Payer: Self-pay | Admitting: Family Medicine

## 2022-12-06 DIAGNOSIS — K219 Gastro-esophageal reflux disease without esophagitis: Secondary | ICD-10-CM

## 2022-12-08 NOTE — Telephone Encounter (Signed)
Requested Prescriptions  Pending Prescriptions Disp Refills   esomeprazole (NEXIUM) 40 MG capsule [Pharmacy Med Name: ESOMEPRAZOLE MAGNESIUM 40 MG CAP] 30 capsule 2    Sig: TAKE 1 CAPSULE BY MOUTH ONCE DAILY     Gastroenterology: Proton Pump Inhibitors 2 Passed - 12/06/2022 12:39 PM      Passed - ALT in normal range and within 360 days    ALT  Date Value Ref Range Status  04/14/2022 31 0 - 44 IU/L Final         Passed - AST in normal range and within 360 days    AST  Date Value Ref Range Status  04/14/2022 26 0 - 40 IU/L Final         Passed - Valid encounter within last 12 months    Recent Outpatient Visits           3 months ago Primary hypertension   [place] [facility] Malva Limes, MD   7 months ago Annual physical exam   [facility] Malva Limes, MD   1 year ago Encounter for initial preventive physical examination covered by [facility]   [facility] Malva Limes, MD   2 years ago Annual physical exam   [facility] Malva Limes, MD   3 years ago Annual physical exam   [facility] Malva Limes, MD       Future Appointments             In 2 months Willeen Niece, MD [facility]

## 2022-12-13 ENCOUNTER — Ambulatory Visit: Payer: Medicare PPO | Admitting: Dermatology

## 2022-12-13 DIAGNOSIS — L570 Actinic keratosis: Secondary | ICD-10-CM

## 2022-12-13 MED ORDER — AMINOLEVULINIC ACID HCL 10 % EX GEL
2000.0000 mg | Freq: Once | CUTANEOUS | Status: AC
  Administered 2022-12-13: 2000 mg via TOPICAL

## 2022-12-13 NOTE — Patient Instructions (Signed)

## 2022-12-13 NOTE — Progress Notes (Signed)
Patient completed red light phototherapy with debridement today.  ACTINIC KERATOSES Exam: Erythematous thin papules/macules with gritty scale.  Treatment Plan:  Red Light Photodynamic therapy  Procedure discussed: discussed risks, benefits, side effects. and alternatives   Prep: site scrubbed/prepped with acetone   Debridement needed: Yes (performed by Physician with sand paper.  (CPT C5184948)) Location:  face Number of lesions:  Multiple (> 15) Type of treatment:  Red light Aminolevulinic Acid (see MAR for details): Ameluz Aminolevulinic Acid comment:  [id number] Amount of Ameluz (mg):  1 Incubation time (minutes):  60 Number of minutes under lamp:  20 Cooling:  Fan Outcome: patient tolerated procedure well with no complications   Post-procedure details: sunscreen applied and aftercare instructions given to patient    Related Medications Aminolevulinic Acid HCl 10 % GEL 2,000 mg  Dorathy Daft, RMA  Willeen Niece, MD I personally debrided area prior to application of aminolevulinic acid  Documentation: I have reviewed the above documentation for accuracy and completeness, and I agree with the above.  Willeen Niece, MD

## 2022-12-30 DIAGNOSIS — M5136 Other intervertebral disc degeneration, lumbar region: Secondary | ICD-10-CM | POA: Diagnosis not present

## 2022-12-30 DIAGNOSIS — M25551 Pain in right hip: Secondary | ICD-10-CM | POA: Diagnosis not present

## 2022-12-30 DIAGNOSIS — M5416 Radiculopathy, lumbar region: Secondary | ICD-10-CM | POA: Diagnosis not present

## 2022-12-30 DIAGNOSIS — Z6828 Body mass index (BMI) 28.0-28.9, adult: Secondary | ICD-10-CM | POA: Diagnosis not present

## 2023-01-12 DIAGNOSIS — G4733 Obstructive sleep apnea (adult) (pediatric): Secondary | ICD-10-CM | POA: Diagnosis not present

## 2023-01-19 DIAGNOSIS — G4733 Obstructive sleep apnea (adult) (pediatric): Secondary | ICD-10-CM | POA: Diagnosis not present

## 2023-02-10 ENCOUNTER — Other Ambulatory Visit: Payer: Self-pay | Admitting: Family Medicine

## 2023-02-10 DIAGNOSIS — I10 Essential (primary) hypertension: Secondary | ICD-10-CM

## 2023-02-11 NOTE — Telephone Encounter (Signed)
Requested Prescriptions  Pending Prescriptions Disp Refills   irbesartan-hydrochlorothiazide (AVALIDE) 150-12.5 MG tablet [Pharmacy Med Name: IRBESARTAN-HCTZ 150-12.5 MG TAB] 90 tablet 0    Sig: TAKE ONE TABLET EVERY DAY     Cardiovascular: ARB + Diuretic Combos Failed - 02/10/2023  1:17 PM      Failed - K in normal range and within 180 days    Potassium  Date Value Ref Range Status  04/14/2022 4.6 3.5 - 5.2 mmol/L Final         Failed - Na in normal range and within 180 days    Sodium  Date Value Ref Range Status  04/14/2022 143 134 - 144 mmol/L Final         Failed - Cr in normal range and within 180 days    Creat  Date Value Ref Range Status  02/16/2017 0.82 0.70 - 1.25 mg/dL Final    Comment:    For patients >69 years of age, the reference limit for Creatinine is approximately 13% higher for people identified as African-American. .    Creatinine, Ser  Date Value Ref Range Status  04/14/2022 0.91 0.76 - 1.27 mg/dL Final         Failed - eGFR is 10 or above and within 180 days    GFR, Est African American  Date Value Ref Range Status  12/15/2016 108 > OR = 60 mL/min/1.14m2 Final   GFR calc Af Amer  Date Value Ref Range Status  03/05/2020 103 >59 mL/min/1.73 Final    Comment:    **In accordance with recommendations from the NKF-ASN Task force,**   [facility] is in the process of updating its eGFR calculation to the   2021 CKD-EPI creatinine equation that estimates kidney function   without a race variable.    GFR, Est Non African American  Date Value Ref Range Status  12/15/2016 93 > OR = 60 mL/min/1.10m2 Final   GFR calc non Af Amer  Date Value Ref Range Status  03/05/2020 89 >59 mL/min/1.73 Final   eGFR  Date Value Ref Range Status  04/14/2022 93 >59 mL/min/1.73 Final         Failed - Last BP in normal range    BP Readings from Last 1 Encounters:  08/27/22 (!) 146/78         Passed - Patient is not pregnant      Passed - Valid encounter within last  6 months    Recent Outpatient Visits           5 months ago Primary hypertension   [place] Malva Limes, MD   10 months ago Annual physical exam   [facility] Malva Limes, MD   1 year ago Encounter for initial preventive physical examination covered by [facility]   [facility] Malva Limes, [facility]   2 years ago Annual physical exam   [facility] Malva Limes, MD   3 years ago Annual physical exam   [facility] Malva Limes, [facility]       Future Appointments             In 4 days Willeen Niece, MD [facility]

## 2023-02-15 ENCOUNTER — Ambulatory Visit: Payer: Medicare PPO | Admitting: Dermatology

## 2023-02-15 DIAGNOSIS — R238 Other skin changes: Secondary | ICD-10-CM | POA: Diagnosis not present

## 2023-02-15 DIAGNOSIS — D1801 Hemangioma of skin and subcutaneous tissue: Secondary | ICD-10-CM | POA: Diagnosis not present

## 2023-02-15 DIAGNOSIS — W908XXA Exposure to other nonionizing radiation, initial encounter: Secondary | ICD-10-CM

## 2023-02-15 DIAGNOSIS — L814 Other melanin hyperpigmentation: Secondary | ICD-10-CM

## 2023-02-15 DIAGNOSIS — Z1283 Encounter for screening for malignant neoplasm of skin: Secondary | ICD-10-CM | POA: Diagnosis not present

## 2023-02-15 DIAGNOSIS — L570 Actinic keratosis: Secondary | ICD-10-CM | POA: Diagnosis not present

## 2023-02-15 DIAGNOSIS — L578 Other skin changes due to chronic exposure to nonionizing radiation: Secondary | ICD-10-CM

## 2023-02-15 DIAGNOSIS — L821 Other seborrheic keratosis: Secondary | ICD-10-CM | POA: Diagnosis not present

## 2023-02-15 DIAGNOSIS — L700 Acne vulgaris: Secondary | ICD-10-CM

## 2023-02-15 DIAGNOSIS — Z872 Personal history of diseases of the skin and subcutaneous tissue: Secondary | ICD-10-CM

## 2023-02-15 DIAGNOSIS — D229 Melanocytic nevi, unspecified: Secondary | ICD-10-CM

## 2023-02-15 NOTE — Patient Instructions (Addendum)
Triamcinolone 0.1% Cream - apply to affected area upper back twice daily until improved. Avoid applying to face, groin, and axilla. Use as directed. Long-term use can cause thinning of the skin.   Cryotherapy Aftercare  Wash gently with soap and water everyday.   Apply Vaseline and Band-Aid daily until healed.   Due to recent changes in healthcare laws, you may see results of your pathology and/or laboratory studies on MyChart before the doctors have had a chance to review them. We understand that in some cases there may be results that are confusing or concerning to you. Please understand that not all results are received at the same time and often the doctors may need to interpret multiple results in order to provide you with the best plan of care or course of treatment. Therefore, we ask that you please give [place] 2 business days to thoroughly review all your results before contacting the office for clarification. Should we see a critical lab result, you will be contacted sooner.   If You Need Anything After Your Visit  If you have any questions or concerns for your doctor, please call our main line at [phone] and press option 4 to reach your doctor's medical assistant. If no one answers, please leave a voicemail as directed and we will return your call as soon as possible. Messages left after 4 pm will be answered the following business day.   You may also send [place] a message via MyChart. We typically respond to MyChart messages within 1-2 business days.  For prescription refills, please ask your pharmacy to contact our office. Our fax number is [phone].  If you have an urgent issue when the clinic is closed that cannot wait until the next business day, you can page your doctor at the number below.    Please note that while we do our best to be available for urgent issues outside of office hours, we are not available 24/7.   If you have an urgent issue and are unable to reach [place], you  may choose to seek medical care at your doctor's office, retail clinic, urgent care center, or emergency room.  If you have a medical emergency, please immediately call 911 or go to the emergency department.  Pager Numbers  - Dr. Gwen Pounds: [phone]  - Dr. Roseanne Reno: [phone]  - Dr. Katrinka Blazing: [phone]   In the event of inclement weather, please call our main line at [phone] for an update on the status of any delays or closures.  Dermatology Medication Tips: Please keep the boxes that topical medications come in in order to help keep track of the instructions about where and how to use these. Pharmacies typically print the medication instructions only on the boxes and not directly on the medication tubes.   If your medication is too expensive, please contact our office at [phone] option 4 or send [place] a message through MyChart.   We are unable to tell what your co-pay for medications will be in advance as this is different depending on your insurance coverage. However, we may be able to find a substitute medication at lower cost or fill out paperwork to get insurance to cover a needed medication.   If a prior authorization is required to get your medication covered by your insurance company, please allow [place] 1-2 business days to complete this process.  Drug prices often vary depending on where the prescription is filled and some pharmacies may offer cheaper prices.  The website www.goodrx.com contains  coupons for medications through different pharmacies. The prices here do not account for what the cost may be with help from insurance (it may be cheaper with your insurance), but the website can give you the price if you did not use any insurance.  - You can print the associated coupon and take it with your prescription to the pharmacy.  - You may also stop by our office during regular business hours and pick up a GoodRx coupon card.  - If you need your prescription sent  electronically to a different pharmacy, notify our office through [facility] or by phone at [phone] option 4.     Si Usted Necesita Algo Despus de Su Visita  Tambin puede enviarnos un mensaje a travs de Clinical cytogeneticist. Por lo general respondemos a los mensajes de MyChart en el transcurso de 1 a 2 das hbiles.  Para renovar recetas, por favor pida a su farmacia que se ponga en contacto con nuestra oficina. Annie Sable de fax es [place] [phone].  Si tiene un asunto urgente cuando la clnica est cerrada y que no puede esperar hasta el siguiente da hbil, puede llamar/localizar a su doctor(a) al nmero que aparece a continuacin.   Por favor, tenga en cuenta que aunque hacemos todo lo posible para estar disponibles para asuntos urgentes fuera del horario de [place], no estamos disponibles las 24 horas del da, los 7 [address]  [address] de la [place].   Si tiene un problema urgente y no puede comunicarse con nosotros, puede optar por buscar atencin mdica  en el consultorio de su doctor(a), en una clnica privada, en un centro de atencin urgente o en una sala de emergencias.  Si tiene Engineer, drilling, por favor llame inmediatamente al 911 o vaya a la sala de emergencias.  Nmeros de bper  - Dr. Gwen Pounds: [phone]  - Dra. Roseanne Reno: [phone]  - Dr. Katrinka Blazing: [phone]   En caso de inclemencias del tiempo, por favor llame a Lacy Duverney principal al [phone] para una actualizacin sobre el [place] de cualquier retraso o cierre.  Consejos para la medicacin en dermatologa: Por favor, guarde las cajas en las que vienen los medicamentos de uso tpico para ayudarle a seguir las instrucciones sobre dnde y cmo usarlos. Las farmacias generalmente imprimen las instrucciones del medicamento slo en las cajas y no directamente en los tubos del [place].   Si su medicamento es muy caro, por favor, pngase en contacto con Rolm Gala llamando al [phone] y presione la  opcin 4 o envenos un mensaje a travs de Clinical cytogeneticist.   No podemos decirle cul ser su copago por los medicamentos por adelantado ya que esto es diferente dependiendo de la cobertura de su seguro. Sin embargo, es posible que podamos encontrar un medicamento sustituto a Audiological scientist un formulario para que el seguro cubra el medicamento que se considera necesario.   Si se requiere una autorizacin previa para que su compaa de seguros [place] su medicamento, por favor permtanos de 1 a 2 das hbiles para completar [address]  Los precios de los medicamentos varan con frecuencia dependiendo del Environmental consultant de dnde se surte la receta y alguna farmacias pueden ofrecer precios ms baratos.  El sitio web www.goodrx.com tiene cupones para medicamentos de Health and safety inspector. Los precios aqu no tienen en cuenta lo que podra costar con la ayuda del seguro (puede ser ms barato con su seguro), pero el sitio web puede darle el precio si no utiliz Tourist information centre manager.  - Puede imprimir el  cupn correspondiente y llevarlo con su receta a la farmacia.  - Tambin puede pasar por nuestra oficina durante el horario de atencin regular y Education officer, museum una tarjeta de cupones de GoodRx.  - Si necesita que su receta se enve electrnicamente a una farmacia diferente, informe a nuestra oficina a travs de MyChart de  o por telfono llamando al [phone] y presione la opcin 4.

## 2023-02-15 NOTE — Progress Notes (Signed)
Follow-Up Visit   Subjective  Tyler Roberts is a 67 y.o. male who presents for the following: Skin Cancer Screening and Upper Body Skin Exam  The patient presents for Upper Body Skin Exam (UBSE) for skin cancer screening and mole check. The patient has spots, moles and lesions to be evaluated, some may be new or changing. History of Aks, Red light with debridement 12/13/2022 with a good reaction. No history of skin cancer.     The following portions of the chart were reviewed this encounter and updated as appropriate: medications, allergies, medical history  Review of Systems:  No other skin or systemic complaints except as noted in HPI or Assessment and Plan.  Objective  Well appearing patient in no apparent distress; mood and affect are within normal limits.  All skin waist up examined. Relevant physical exam findings are noted in the Assessment and Plan.  L ear helix x 1 Pink scaly macules.     Assessment & Plan   AK (actinic keratosis) L ear helix x 1  Actinic keratoses are precancerous spots that appear secondary to cumulative UV radiation exposure/sun exposure over time. They are chronic with expected duration over 1 year. A portion of actinic keratoses will progress to squamous cell carcinoma of the skin. It is not possible to reliably predict which spots will progress to skin cancer and so treatment is recommended to prevent development of skin cancer.  Recommend daily broad spectrum sunscreen SPF 30+ to sun-exposed areas, reapply every 2 hours as needed.  Recommend staying in the shade or wearing long sleeves, sun glasses (UVA+UVB protection) and wide brim hats (4-inch brim around the entire circumference of the hat). Call for new or changing lesions.  Destruction of lesion - L ear helix x 1  Destruction method: cryotherapy   Informed consent: discussed and consent obtained   Lesion destroyed using liquid nitrogen: Yes   Region frozen until ice ball extended beyond  lesion: Yes   Outcome: patient tolerated procedure well with no complications   Post-procedure details: wound care instructions given   Additional details:  Prior to procedure, discussed risks of blister formation, small wound, skin dyspigmentation, or rare scar following cryotherapy. Recommend Vaseline ointment to treated areas while healing.    Skin cancer screening performed today.  Actinic Damage - Chronic condition, secondary to cumulative UV/sun exposure - diffuse scaly erythematous macules with underlying dyspigmentation - Recommend daily broad spectrum sunscreen SPF 30+ to sun-exposed areas, reapply every 2 hours as needed.  - Staying in the shade or wearing long sleeves, sun glasses (UVA+UVB protection) and wide brim hats (4-inch brim around the entire circumference of the hat) are also recommended for sun protection.  - Call for new or changing lesions. - Good results with Red light PDT  Lentigines, Seborrheic Keratoses, Hemangiomas - Benign normal skin lesions - Benign-appearing - Call for any changes  Melanocytic Nevi - Tan-brown and/or pink-flesh-colored symmetric macules and papules - Benign appearing on exam today - Observation - Call clinic for new or changing moles - Recommend daily use of broad spectrum spf 30+ sunscreen to sun-exposed areas.   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 Patient had bumps come up after he's had a cold, secondary to rubbing.   IRRITATED NEVUS VS INFLAMED SEBORRHEIC KERATOSIS Exam: 8.0 mm pink thin smooth papule with mild scale of the R spinal upper back, itchy per pt  Treatment Plan: Start TMC 0.1% Cream twice daily to  AA. If not improving, discussed shave removal. Avoid applying to face, groin, and axilla. Use as directed. Long-term use can cause thinning of the skin.    Return in about 1 year (around 02/15/2024) for UBSE, AKs.  ICherlyn Labella, CMA, am acting as scribe  for Willeen Niece, MD .   Documentation: I have reviewed the above documentation for accuracy and completeness, and I agree with the above.  Willeen Niece, MD

## 2023-03-02 ENCOUNTER — Other Ambulatory Visit: Payer: Self-pay | Admitting: Family Medicine

## 2023-03-02 DIAGNOSIS — K219 Gastro-esophageal reflux disease without esophagitis: Secondary | ICD-10-CM

## 2023-03-02 NOTE — Telephone Encounter (Signed)
Requested Prescriptions  Pending Prescriptions Disp Refills   esomeprazole (NEXIUM) 40 MG capsule [Pharmacy Med Name: ESOMEPRAZOLE MAGNESIUM 40 MG CAP] 90 capsule 1    Sig: TAKE 1 CAPSULE BY MOUTH ONCE DAILY     Gastroenterology: Proton Pump Inhibitors 2 Passed - 03/02/2023  1:13 PM      Passed - ALT in normal range and within 360 days    ALT  Date Value Ref Range Status  04/14/2022 31 0 - 44 IU/L Final         Passed - AST in normal range and within 360 days    AST  Date Value Ref Range Status  04/14/2022 26 0 - 40 IU/L Final         Passed - Valid encounter within last 12 months    Recent Outpatient Visits           6 months ago Primary hypertension   [facility] Malva Limes, MD   10 months ago Annual physical exam   [facility] Malva Limes, MD   1 year ago Encounter for initial preventive physical examination covered by [facility]   [facility] Malva Limes, MD   2 years ago Annual physical exam   [facility] Malva Limes, MD   4 years ago Annual physical exam   [facility] Malva Limes, MD       Future Appointments             In 11 months Willeen Niece, MD [facility]

## 2023-03-07 DIAGNOSIS — M5416 Radiculopathy, lumbar region: Secondary | ICD-10-CM | POA: Diagnosis not present

## 2023-04-08 ENCOUNTER — Ambulatory Visit (INDEPENDENT_AMBULATORY_CARE_PROVIDER_SITE_OTHER): Payer: Medicare PPO | Admitting: Family Medicine

## 2023-04-08 ENCOUNTER — Encounter: Payer: Self-pay | Admitting: Family Medicine

## 2023-04-08 VITALS — BP 153/83 | HR 88 | Resp 18 | Ht 70.0 in | Wt 194.0 lb

## 2023-04-08 DIAGNOSIS — Z23 Encounter for immunization: Secondary | ICD-10-CM

## 2023-04-08 DIAGNOSIS — Z0000 Encounter for general adult medical examination without abnormal findings: Secondary | ICD-10-CM

## 2023-04-08 DIAGNOSIS — I10 Essential (primary) hypertension: Secondary | ICD-10-CM

## 2023-04-08 DIAGNOSIS — Z125 Encounter for screening for malignant neoplasm of prostate: Secondary | ICD-10-CM

## 2023-04-08 DIAGNOSIS — E781 Pure hyperglyceridemia: Secondary | ICD-10-CM | POA: Diagnosis not present

## 2023-04-08 DIAGNOSIS — E349 Endocrine disorder, unspecified: Secondary | ICD-10-CM | POA: Diagnosis not present

## 2023-04-08 NOTE — Progress Notes (Signed)
 Complete physical exam   Patient: Tyler Roberts   DOB: [birth year]   68 y.o. Male  MRN: [id number] Visit Date: 04/08/2023  Today's healthcare provider: Nancyann Barbian, MD    Subjective    OWYNN Roberts is a 68 y.o. male who presents today for a complete physical exam.  He reports consuming a  healthy  diet. Golfs regularly and gets exercise by working around home and stays active.  He generally feels well. He reports sleeping well. He does not have additional problems to discuss today.   He is also due for follow up lipids, htn, and hypogonadism. Doing well current medications. Home Bps usually 130s to 140s. Taking meds consistently. Continues regular follow up dermatology and ophthalmology.   Past Medical History:  Diagnosis Date   Actinic keratosis    Shingles 09/26/2014   Past Surgical History:  Procedure Laterality Date   HERNIA REPAIR  1990   KNEE SURGERY Right 2008   Due to a staph infection   LASIK     NOSE SURGERY  2002   TONSILLECTOMY AND ADENOIDECTOMY  2002   Social History   Socioeconomic History   Marital status: Married    Spouse name: Not on file   Number of children: 1   Years of education: H/S   Highest education level: Not on file  Occupational History   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[employer]  Tobacco Use   Smoking status: Former    Types: Cigars    Quit date: 04/05/2017    Years since quitting: 6.0   Smokeless tobacco: Never   Tobacco comments:    occasionally smoked intermittenlty for a few years  Substance and Sexual Activity   Alcohol use: Yes    Alcohol/week: 0.0 standard drinks of alcohol    Comment: Occasional   Drug use: No   Sexual activity: Not on file  Other Topics Concern   Not on file  Social History Narrative   Not on file   Social Drivers of Health   Financial Resource Strain: Low Risk  (04/08/2023)   Overall Financial Resource Strain (CARDIA)    Difficulty of Paying Living Expenses:  Not hard at all  Food Insecurity: No Food Insecurity (04/08/2023)   Hunger Vital Sign    Worried About Running Out of Food in the Last Year: Never true    Ran Out of Food in the Last Year: Never true  Transportation Needs: No Transportation Needs (04/08/2023)   PRAPARE - Administrator, Civil Service (Medical): No    Lack of Transportation (Non-Medical): No  Physical Activity: Sufficiently Active (04/08/2023)   Exercise Vital Sign    Days of Exercise per Week: 7 days    Minutes of Exercise per Session: 60 min  Stress: No Stress Concern Present (04/08/2023)   [employer] of Occupational Health - Occupational Stress Questionnaire    Feeling of Stress : Not at all  Social Connections: Moderately Isolated (04/08/2023)   Social Connection and Isolation Panel [NHANES]    Frequency of Communication with Friends and Family: More than three times a week    Frequency of Social Gatherings with Friends and Family: Twice a week    Attends Religious Services: Never    Database Administrator or Organizations: No    Attends Banker Meetings: Never    Marital Status: Married  Catering Manager Violence: Not At Risk (04/08/2023)   Humiliation, Afraid, Rape,  and Kick questionnaire    Fear of Current or Ex-Partner: No    Emotionally Abused: No    Physically Abused: No    Sexually Abused: No   Family Status  Relation Name Status   Mother  Deceased   Father  Deceased   Sister  Deceased   Brother  Alive   Sister  Alive   Sister  Alive  No partnership data on file   Family History  Problem Relation Age of Onset   Anuerysm Mother        Brain Aneurysm   Heart attack Father    Heart disease Father 43's   Ovarian cancer Sister    Healthy Brother    Healthy Sister    Healthy Sister    Allergies  Allergen Reactions   Amlodipine Swelling    Facial swelling   Atorvastatin     weakness   Fenofibrate     Other reaction(s): Muscle Pain Elevated CK   Gemfibrozil     Other  reaction(s): Muscle Pain   Niacin     Flushing   Lotensin  [Benazepril ] Rash    Patient Care Team: Gasper Nancyann BRAVO, MD as PCP - General (Family Medicine) Jackquline Sawyer, MD (Dermatology) Letha Cancer, MD as Consulting Physician (Physical Medicine and Rehabilitation) Pa, [facility] Od Alma, Teodoro K, MD as Consulting Physician (Gastroenterology)   Medications: Outpatient Medications Prior to Visit  Medication Sig   aspirin 81 MG tablet    esomeprazole (NEXIUM) 40 MG capsule TAKE 1 CAPSULE BY MOUTH ONCE DAILY   flunisolide (NASALIDE) 25 MCG/ACT (0.025%) SOLN USE 1 SPRAY IN EACH NOSTRIL DAILY AS DIRECTED   irbesartan -hydrochlorothiazide  (AVALIDE) 150-12.5 MG tablet TAKE ONE TABLET EVERY DAY   lovastatin  (MEVACOR ) 20 MG tablet TAKE 1 TABLET BY MOUTH DAILY   meloxicam  (MOBIC ) 15 MG tablet TAKE ONE TABLET BY MOUTH EVERY DAY WITH A MEAL   metoprolol  succinate (TOPROL -XL) 25 MG 24 hr tablet TAKE 1 TABLET BY MOUTH ONCE DAILY   Testosterone  1.62 % GEL PLACE ONE PUMP ONTO THE SKIN DAILY AS DIRECTED   triamcinolone  cream (KENALOG ) 0.1 % APPLY TO AFFECTED AREAS OF ITCHY RASH TOPICALLY ONCE TO TWICE DAILY UNITL CLEAR THEN AS NEEDED FOR FLARES. AVOID FACE,GROIN, AND AXILLA.   [DISCONTINUED] loratadine  (CLARITIN ) 10 MG tablet Take 1 tablet (10 mg total) by mouth daily. (Patient not taking: Reported on 04/08/2023)   No facility-administered medications prior to visit.    Review of Systems  Constitutional:  Negative for appetite change, chills and fever.  Respiratory:  Negative for chest tightness, shortness of breath and wheezing.   Cardiovascular:  Negative for chest pain and palpitations.  Gastrointestinal:  Negative for abdominal pain, nausea and vomiting.      Objective    BP (!) 153/83   Pulse 88   Resp 18   Ht 5' 10 (1.778 m)   Wt 194 lb (88 kg)   SpO2 98%   BMI 27.84 kg/m    Physical Exam   General Appearance:    Well developed, well nourished male. Alert,  cooperative, in no acute distress, appears stated age  Head:    Normocephalic, without obvious abnormality, atraumatic  Eyes:    PERRL, conjunctiva/corneas clear, EOM's intact, fundi    benign, both eyes       Ears:    Normal TM's and external ear canals, both ears  Nose:   Nares normal, septum midline, mucosa normal, no drainage   or sinus tenderness  Throat:  Lips, mucosa, and tongue normal; teeth and gums normal  Neck:   Supple, symmetrical, trachea midline, no adenopathy;       thyroid:  No enlargement/tenderness/nodules; no carotid   bruit or JVD  Back:     Symmetric, no curvature, ROM normal, no CVA tenderness  Lungs:     Clear to auscultation bilaterally, respirations unlabored  Chest wall:    No tenderness or deformity  Heart:    Normal heart rate. Normal rhythm. No murmurs, rubs, or gallops.  S1 and S2 normal  Abdomen:     Soft, non-tender, bowel sounds active all four quadrants,    no masses, no organomegaly  Genitalia:    deferred  Rectal:    deferred  Extremities:   All extremities are intact. No cyanosis or edema  Pulses:   2+ and symmetric all extremities  Skin:   Skin color, texture, turgor normal, no rashes or lesions  Lymph nodes:   Cervical, supraclavicular, and axillary nodes normal  Neurologic:   CNII-XII intact. Normal strength, sensation and reflexes      throughout     Last depression screening scores    04/08/2023    8:39 AM 04/14/2022    8:21 AM 04/01/2021    9:22 AM  PHQ 2/9 Scores  PHQ - 2 Score 0 0 0  PHQ- 9 Score 0 0 3   Last fall risk screening    04/08/2023    8:09 AM  Fall Risk   Falls in the past year? 0  Number falls in past yr: 0  Injury with Fall? 0   Last Audit-C alcohol use screening    04/14/2022    8:21 AM  Alcohol Use Disorder Test (AUDIT)  1. How often do you have a drink containing alcohol? 3  2. How many drinks containing alcohol do you have on a typical day when you are drinking? 1  3. How often do you have six or more  drinks on one occasion? 1  AUDIT-C Score 5   A score of 3 or more in women, and 4 or more in men indicates increased risk for alcohol abuse, EXCEPT if all of the points are from question 1   No results found for any visits on 04/08/23.  Assessment & Plan    Routine Health Maintenance and Physical Exam  Exercise Activities and Dietary recommendations  Goals   None     Immunization History  Administered Date(s) Administered   Fluad Quad(high Dose 65+) 04/01/2021, 04/14/2022   Fluad Trivalent(High Dose 65+) 04/08/2023   Influenza,inj,Quad PF,6+ Mos 12/22/2017, 01/26/2019, 03/05/2020   Influenza-Unspecified 01/17/2017   PFIZER(Purple Top)SARS-COV-2 Vaccination 07/12/2019, 08/07/2019, 03/05/2020   PNEUMOCOCCAL CONJUGATE-20 04/01/2021   Td 12/22/2017   Tdap 07/25/2007   Zoster Recombinant(Shingrix ) 12/15/2016, 02/16/2017    Health Maintenance  Topic Date Due   COVID-19 Vaccine (4 - 2024-25 season) 12/05/2022   Medicare Annual Wellness (AWV)  04/07/2024   Colonoscopy  07/07/2026   DTaP/Tdap/Td (3 - Td or Tdap) 12/23/2027   Pneumonia Vaccine 30+ Years old  Completed   INFLUENZA VACCINE  Completed   Hepatitis C Screening  Completed   Zoster Vaccines- Shingrix   Completed   HPV VACCINES  Aged Out    Discussed health benefits of physical activity, and encouraged him to engage in regular exercise appropriate for his age and condition.   2. Primary hypertension Anticipate BP medication adjustment after reviewing lab.  - Magnesium  3. Hyperglyceridemia, pure He is tolerating lovastatin  well with  no adverse effects.   - CBC - Comprehensive metabolic panel - Lipid panel  4. Need for influenza vaccination  - Flu Vaccine Trivalent High Dose (Fluad)  5. Prostate cancer screening  - PSA Total (Reflex To Free)  6. Hypotestosteronism Feels well on current testosterone  replacement.  - Testosterone ,Free and Total        Nancyann Breault, MD  [facility] [phone] (phone) [phone] (fax)  [facility]

## 2023-04-08 NOTE — Patient Instructions (Signed)
 Please review the attached list of medications and notify my office if there are any errors.   We'll plan on adjusting your blood pressure medications after reviewing the results of your labs. We need to see your blood stay <130/80 most of the time

## 2023-04-08 NOTE — Progress Notes (Signed)
 Annual Wellness Visit     Patient: Tyler Roberts, Male    DOB: Feb 28, 1956, 68 y.o.   MRN: [id number] Visit Date: 04/08/2023  Today's Provider: Nancyann Senkbeil, MD    Subjective    Tyler Roberts is a 68 y.o. male who presents today for his Annual Wellness Visit.   Medications: Outpatient Medications Prior to Visit  Medication Sig   aspirin 81 MG tablet    esomeprazole (NEXIUM) 40 MG capsule TAKE 1 CAPSULE BY MOUTH ONCE DAILY   flunisolide (NASALIDE) 25 MCG/ACT (0.025%) SOLN USE 1 SPRAY IN EACH NOSTRIL DAILY AS DIRECTED   irbesartan -hydrochlorothiazide  (AVALIDE) 150-12.5 MG tablet TAKE ONE TABLET EVERY DAY   lovastatin  (MEVACOR ) 20 MG tablet TAKE 1 TABLET BY MOUTH DAILY   meloxicam  (MOBIC ) 15 MG tablet TAKE ONE TABLET BY MOUTH EVERY DAY WITH A MEAL   metoprolol  succinate (TOPROL -XL) 25 MG 24 hr tablet TAKE 1 TABLET BY MOUTH ONCE DAILY   Testosterone  1.62 % GEL PLACE ONE PUMP ONTO THE SKIN DAILY AS DIRECTED   triamcinolone  cream (KENALOG ) 0.1 % APPLY TO AFFECTED AREAS OF ITCHY RASH TOPICALLY ONCE TO TWICE DAILY UNITL CLEAR THEN AS NEEDED FOR FLARES. AVOID FACE,GROIN, AND AXILLA.   [DISCONTINUED] loratadine  (CLARITIN ) 10 MG tablet Take 1 tablet (10 mg total) by mouth daily. (Patient not taking: Reported on 04/08/2023)   No facility-administered medications prior to visit.    Allergies  Allergen Reactions   Amlodipine Swelling    Facial swelling   Atorvastatin     weakness   Fenofibrate     Other reaction(s): Muscle Pain Elevated CK   Gemfibrozil     Other reaction(s): Muscle Pain   Niacin     Flushing   Lotensin  [Benazepril ] Rash    Patient Care Team: Henthorn Nancyann BRAVO, MD as PCP - General (Family Medicine) Jackquline Sawyer, MD (Dermatology) Letha Cancer, MD as Consulting Physician (Physical Medicine and Rehabilitation) Pa, [facility] [place], Teodoro K, MD as Consulting Physician (Gastroenterology)    Objective      Most recent functional status  assessment:    04/08/2023    8:04 AM  In your present state of health, do you have any difficulty performing the following activities:  Hearing? 0  Vision? 0  Difficulty concentrating or making decisions? 0  Walking or climbing stairs? 0  Dressing or bathing? 0  Doing errands, shopping? 0  Preparing Food and eating ? N  Using the Toilet? N  In the past six months, have you accidently leaked urine? N  Do you have problems with loss of bowel control? N  Managing your Medications? N  Managing your Finances? N  Housekeeping or managing your Housekeeping? N   Most recent fall risk assessment:    04/08/2023    8:09 AM  Fall Risk   Falls in the past year? 0  Number falls in past yr: 0  Injury with Fall? 0    Most recent depression screenings:    04/08/2023    8:39 AM 04/14/2022    8:21 AM  PHQ 2/9 Scores  PHQ - 2 Score 0 0  PHQ- 9 Score 0 0   Most recent cognitive screening:     No data to display         Most recent Audit-C alcohol use screening    04/14/2022    8:21 AM  Alcohol Use Disorder Test (AUDIT)  1. How often do you have a drink containing alcohol? 3  2. How many drinks containing alcohol do you have on a typical day when you are drinking? 1  3. How often do you have six or more drinks on one occasion? 1  AUDIT-C Score 5   A score of 3 or more in women, and 4 or more in men indicates increased risk for alcohol abuse, EXCEPT if all of the points are from question 1   No results found for any visits on 04/08/23.  Assessment & Plan     Annual wellness visit done today including the all of the following: Reviewed patient's Family Medical History Reviewed and updated list of patient's medical providers Assessment of cognitive impairment was done Assessed patient's functional ability Established a written schedule for health screening services Health Risk Assessent Completed and Reviewed  Exercise Activities and Dietary recommendations  Goals   None      Immunization History  Administered Date(s) Administered   Fluad Quad(high Dose 65+) 04/01/2021, 04/14/2022   Fluad Trivalent(High Dose 65+) 04/08/2023   Influenza,inj,Quad PF,6+ Mos 12/22/2017, 01/26/2019, 03/05/2020   Influenza-Unspecified 01/17/2017   PFIZER(Purple Top)SARS-COV-2 Vaccination 07/12/2019, 08/07/2019, 03/05/2020   PNEUMOCOCCAL CONJUGATE-20 04/01/2021   Td 12/22/2017   Tdap 07/25/2007   Zoster Recombinant(Shingrix ) 12/15/2016, 02/16/2017    Health Maintenance  Topic Date Due   COVID-19 Vaccine (4 - 2024-25 season) 12/05/2022   Medicare Annual Wellness (AWV)  04/07/2024   Colonoscopy  07/07/2026   DTaP/Tdap/Td (3 - Td or Tdap) 12/23/2027   Pneumonia Vaccine 71+ Years old  Completed   INFLUENZA VACCINE  Completed   Hepatitis C Screening  Completed   Zoster Vaccines- Shingrix   Completed   HPV VACCINES  Aged Out     Discussed health benefits of physical activity, and encouraged him to engage in regular exercise appropriate for his age and condition.           Nancyann Feil, MD  [facility] [phone] (phone) [phone] (fax)  [facility]

## 2023-04-10 ENCOUNTER — Encounter: Payer: Self-pay | Admitting: Family Medicine

## 2023-04-10 ENCOUNTER — Other Ambulatory Visit: Payer: Self-pay | Admitting: Family Medicine

## 2023-04-10 DIAGNOSIS — I10 Essential (primary) hypertension: Secondary | ICD-10-CM

## 2023-04-10 LAB — LIPID PANEL
Chol/HDL Ratio: 4.5 {ratio} (ref 0.0–5.0)
Cholesterol, Total: 185 mg/dL (ref 100–199)
HDL: 41 mg/dL (ref >39)
LDL Chol Calc (NIH): 89 mg/dL (ref 0–99)
Triglycerides: 333 mg/dL — ABNORMAL HIGH (ref 0–149)
VLDL Cholesterol Cal: 55 mg/dL — ABNORMAL HIGH (ref 5–40)

## 2023-04-10 LAB — CBC
Hematocrit: 46.8 % (ref 37.5–51.0)
Hemoglobin: 15.5 g/dL (ref 13.0–17.7)
MCH: 30.5 pg (ref 26.6–33.0)
MCHC: 33.1 g/dL (ref 31.5–35.7)
MCV: 92 fL (ref 79–97)
Platelets: 252 10*3/uL (ref 150–450)
RBC: 5.09 x10E6/uL (ref 4.14–5.80)
RDW: 13.1 % (ref 11.6–15.4)
WBC: 7.5 10*3/uL (ref 3.4–10.8)

## 2023-04-10 LAB — PSA TOTAL (REFLEX TO FREE): Prostate Specific Ag, Serum: 0.8 ng/mL (ref 0.0–4.0)

## 2023-04-10 LAB — COMPREHENSIVE METABOLIC PANEL
ALT: 37 [IU]/L (ref 0–44)
AST: 27 [IU]/L (ref 0–40)
Albumin: 4.7 g/dL (ref 3.9–4.9)
Alkaline Phosphatase: 60 [IU]/L (ref 44–121)
BUN/Creatinine Ratio: 22 (ref 10–24)
BUN: 20 mg/dL (ref 8–27)
Bilirubin Total: 0.9 mg/dL (ref 0.0–1.2)
CO2: 21 mmol/L (ref 20–29)
Calcium: 9.5 mg/dL (ref 8.6–10.2)
Chloride: 101 mmol/L (ref 96–106)
Creatinine, Ser: 0.93 mg/dL (ref 0.76–1.27)
Globulin, Total: 2.3 g/dL (ref 1.5–4.5)
Glucose: 121 mg/dL — ABNORMAL HIGH (ref 70–99)
Potassium: 4.3 mmol/L (ref 3.5–5.2)
Sodium: 140 mmol/L (ref 134–144)
Total Protein: 7 g/dL (ref 6.0–8.5)
eGFR: 90 mL/min/{1.73_m2} (ref >59)

## 2023-04-10 LAB — TESTOSTERONE,FREE AND TOTAL
Testosterone, Free: 7.2 pg/mL (ref 6.6–18.1)
Testosterone: 264 ng/dL (ref 264–916)

## 2023-04-10 LAB — MAGNESIUM: Magnesium: 1.9 mg/dL (ref 1.6–2.3)

## 2023-04-10 MED ORDER — VALSARTAN-HYDROCHLOROTHIAZIDE 160-25 MG PO TABS: 1.0000 | ORAL_TABLET | Freq: Every day | ORAL | 1 refills | Status: DC

## 2023-04-12 DIAGNOSIS — M51362 Other intervertebral disc degeneration, lumbar region with discogenic back pain and lower extremity pain: Secondary | ICD-10-CM | POA: Diagnosis not present

## 2023-04-15 ENCOUNTER — Other Ambulatory Visit: Payer: Self-pay | Admitting: Family Medicine

## 2023-04-15 DIAGNOSIS — I10 Essential (primary) hypertension: Secondary | ICD-10-CM

## 2023-04-19 DIAGNOSIS — G4733 Obstructive sleep apnea (adult) (pediatric): Secondary | ICD-10-CM | POA: Diagnosis not present

## 2023-04-27 ENCOUNTER — Other Ambulatory Visit: Payer: Self-pay | Admitting: Family Medicine

## 2023-04-27 NOTE — Telephone Encounter (Signed)
Requested Prescriptions  Pending Prescriptions Disp Refills   flunisolide (NASALIDE) 25 MCG/ACT (0.025%) SOLN [Pharmacy Med Name: FLUNISOLIDE 25 MCG/ACT (0.025%) NAS] 25 mL 5    Sig: USE ONE SPRAY IN EACH NOSTRIL ONCE A DAY     Ear, Nose, and Throat: Nasal Preparations - Corticosteroids Passed - 04/27/2023  5:31 PM      Passed - Valid encounter within last 12 months    Recent Outpatient Visits           2 weeks ago Annual physical exam   [facility] Malva Limes, MD   8 months ago Primary hypertension   [place] [facility] Malva Limes, MD   1 year ago Annual physical exam   [place] [facility] Malva Limes, MD   2 years ago Encounter for initial preventive physical examination covered by [facility]   [facility] Malva Limes, MD   3 years ago Annual physical exam   [facility] Malva Limes, MD       Future Appointments             In 4 weeks Sherrie Mustache, Demetrios Isaacs, MD [facility]   In 10 months Willeen Niece, MD [facility]   In 11 months Fisher, Demetrios Isaacs, MD [facility]

## 2023-05-13 ENCOUNTER — Other Ambulatory Visit: Payer: Self-pay | Admitting: Family Medicine

## 2023-05-25 ENCOUNTER — Encounter: Payer: Self-pay | Admitting: Family Medicine

## 2023-05-25 ENCOUNTER — Ambulatory Visit: Payer: Medicare PPO | Admitting: Family Medicine

## 2023-05-25 VITALS — BP 158/80 | HR 82 | Resp 16 | Ht 70.0 in | Wt 194.7 lb

## 2023-05-25 DIAGNOSIS — Z8249 Family history of ischemic heart disease and other diseases of the circulatory system: Secondary | ICD-10-CM

## 2023-05-25 DIAGNOSIS — R739 Hyperglycemia, unspecified: Secondary | ICD-10-CM | POA: Diagnosis not present

## 2023-05-25 DIAGNOSIS — E781 Pure hyperglyceridemia: Secondary | ICD-10-CM

## 2023-05-25 DIAGNOSIS — I10 Essential (primary) hypertension: Secondary | ICD-10-CM

## 2023-05-25 NOTE — Progress Notes (Signed)
 Established patient visit   Patient: Tyler Roberts   DOB: [birth year]   68 y.o. Male  MRN: [id number] Visit Date: 05/25/2023  Today's healthcare provider: Mila Merry, MD   Chief Complaint  Patient presents with   Medical Management of Chronic Issues    BP meds   Subjective    HPI HPI     Medical Management of Chronic Issues    Additional comments: BP meds      Last edited by Marjie Skiff, CMA on 05/25/2023  8:05 AM.      Follow up hypertension since change from avalide to valsartan-hydrochlorothiazide. Is tolerating well. Home Bps usually upper 130s/70s-80s. Tolerating well, feels well, taking consistently. Also noted to have elevated blood sugar on recent labs. Lab Results  Component Value Date   HGBA1C 5.4 08/27/2022   Lab Results  Component Value Date   NA 140 04/08/2023   K 4.3 04/08/2023   CREATININE 0.93 04/08/2023   EGFR 90 04/08/2023   GLUCOSE 121 (H) 04/08/2023     Also interested in Coronary calcium scoring do to wife's concern that she had multiple family members with heart disease and discovered on coronary calcium scoring.   Medications: Outpatient Medications Prior to Visit  Medication Sig   aspirin 81 MG tablet    esomeprazole (NEXIUM) 40 MG capsule TAKE 1 CAPSULE BY MOUTH ONCE DAILY   flunisolide (NASALIDE) 25 MCG/ACT (0.025%) SOLN USE ONE SPRAY IN EACH NOSTRIL ONCE A DAY   irbesartan-hydrochlorothiazide (AVALIDE) 150-12.5 MG tablet TAKE ONE TABLET ONCE DAILY   lovastatin (MEVACOR) 20 MG tablet TAKE 1 TABLET BY MOUTH DAILY   meloxicam (MOBIC) 15 MG tablet TAKE ONE TABLET BY MOUTH EVERY DAY WITH A MEAL   metoprolol succinate (TOPROL-XL) 25 MG 24 hr tablet TAKE 1 TABLET BY MOUTH ONCE DAILY   Testosterone 1.62 % GEL PLACE ONE PUMP ONTO THE SKIN DAILY AS DIRECTED   triamcinolone cream (KENALOG) 0.1 % APPLY TO AFFECTED AREAS OF ITCHY RASH TOPICALLY ONCE TO TWICE DAILY UNITL CLEAR THEN AS NEEDED FOR FLARES. AVOID FACE,GROIN, AND AXILLA.    valsartan-hydrochlorothiazide (DIOVAN-HCT) 160-25 MG tablet Take 1 tablet by mouth daily.   No facility-administered medications prior to visit.    Review of Systems  Constitutional:  Negative for appetite change, chills and fever.  Respiratory:  Negative for chest tightness, shortness of breath and wheezing.   Cardiovascular:  Negative for chest pain and palpitations.  Gastrointestinal:  Negative for abdominal pain, nausea and vomiting.     Objective    BP (!) 158/80 (BP Location: Left Arm, Patient Position: Sitting, Cuff Size: Large) Comment: manual  Pulse 82   Resp 16   Ht 5\' 10"  (1.778 m)   Wt 194 lb 11.2 oz (88.3 kg)   BMI 27.94 kg/m    Physical Exam   General appearance: Well developed, well nourished male, cooperative and in no acute distress Head: Normocephalic, without obvious abnormality, atraumatic Respiratory: Respirations even and unlabored, normal respiratory rate Extremities: All extremities are intact.  Skin: Skin color, texture, turgor normal. No rashes seen  Psych: Appropriate mood and affect. Neurologic: Mental status: Alert, oriented to person, place, and time, thought content appropriate.    Assessment & Plan     1. Primary hypertension (Primary) Home BP improved with change from Avalide to valsartan-hydrochlorothiazide, but not at goal. Anticipate increase to 320/25 if electrolytes stable.  - Renal function panel  2. Hyperglycemia  - Hemoglobin A1c  3. Hyperglyceridemia, pure  -  CT CARDIAC SCORING (SELF PAY ONLY); Future  4. Fam hx-ischem heart disease  - CT CARDIAC SCORING (SELF PAY ONLY); Future         Mila Merry, MD  [facility] [phone] (phone) [phone] (fax)  [facility]

## 2023-05-26 ENCOUNTER — Other Ambulatory Visit: Payer: Self-pay | Admitting: Family Medicine

## 2023-05-26 ENCOUNTER — Encounter: Payer: Self-pay | Admitting: Family Medicine

## 2023-05-26 DIAGNOSIS — I10 Essential (primary) hypertension: Secondary | ICD-10-CM

## 2023-05-26 LAB — RENAL FUNCTION PANEL
Albumin: 4.7 g/dL (ref 3.9–4.9)
BUN/Creatinine Ratio: 17 (ref 10–24)
BUN: 16 mg/dL (ref 8–27)
CO2: 25 mmol/L (ref 20–29)
Calcium: 9.6 mg/dL (ref 8.6–10.2)
Chloride: 100 mmol/L (ref 96–106)
Creatinine, Ser: 0.92 mg/dL (ref 0.76–1.27)
Glucose: 110 mg/dL — ABNORMAL HIGH (ref 70–99)
Phosphorus: 3.2 mg/dL (ref 2.8–4.1)
Potassium: 4.1 mmol/L (ref 3.5–5.2)
Sodium: 142 mmol/L (ref 134–144)
eGFR: 91 mL/min/{1.73_m2} (ref >59)

## 2023-05-26 LAB — HEMOGLOBIN A1C
Est. average glucose Bld gHb Est-mCnc: 114 mg/dL
Hgb A1c MFr Bld: 5.6 % (ref 4.8–5.6)

## 2023-05-26 MED ORDER — VALSARTAN-HYDROCHLOROTHIAZIDE 320-25 MG PO TABS: 1.0000 | ORAL_TABLET | Freq: Every day | ORAL | 3 refills | Status: DC

## 2023-05-31 ENCOUNTER — Ambulatory Visit
Admission: RE | Admit: 2023-05-31 | Discharge: 2023-05-31 | Disposition: A | Discharge status: Home / Self Care | Payer: Self-pay | Source: Ambulatory Visit | Attending: Family Medicine | Admitting: Family Medicine

## 2023-05-31 DIAGNOSIS — E781 Pure hyperglyceridemia: Secondary | ICD-10-CM | POA: Insufficient documentation

## 2023-05-31 DIAGNOSIS — Z8249 Family history of ischemic heart disease and other diseases of the circulatory system: Secondary | ICD-10-CM | POA: Insufficient documentation

## 2023-06-01 ENCOUNTER — Encounter: Payer: Self-pay | Admitting: Family Medicine

## 2023-06-06 ENCOUNTER — Other Ambulatory Visit: Payer: Self-pay | Admitting: Family Medicine

## 2023-06-06 DIAGNOSIS — E349 Endocrine disorder, unspecified: Secondary | ICD-10-CM

## 2023-06-07 ENCOUNTER — Other Ambulatory Visit (HOSPITAL_COMMUNITY): Payer: Self-pay

## 2023-06-10 ENCOUNTER — Other Ambulatory Visit (HOSPITAL_COMMUNITY): Payer: Self-pay

## 2023-06-10 ENCOUNTER — Telehealth: Payer: Self-pay

## 2023-06-10 NOTE — Telephone Encounter (Signed)
 Pharmacy Patient Advocate Encounter   Received notification from CoverMyMeds that prior authorization for Testosterone 1.62% gel is required/requested.   Insurance verification completed.   The patient is insured through [employer] .   Per test claim: PA required; PA submitted to above mentioned insurance via CoverMyMeds Key/confirmation #/EOC [id number] Status is pending

## 2023-06-10 NOTE — Telephone Encounter (Signed)
 Pharmacy Patient Advocate Encounter  Received notification from [facility] that Prior Authorization for Testosterone 1.62% gel has been APPROVED from 06/10/23 to 04/04/24. Ran test claim, Copay is $20. This test claim was processed through [facility]- copay amounts may vary at other pharmacies due to pharmacy/plan contracts, or as the patient moves through the different stages of their insurance plan.   PA #/Case ID/Reference #: [id number]  *spoke with [facility] to process

## 2023-06-15 ENCOUNTER — Telehealth: Payer: Self-pay

## 2023-06-15 ENCOUNTER — Other Ambulatory Visit: Payer: Self-pay | Admitting: Family Medicine

## 2023-06-15 NOTE — Telephone Encounter (Signed)
 Requested Prescriptions  Pending Prescriptions Disp Refills   meloxicam (MOBIC) 15 MG tablet [Pharmacy Med Name: MELOXICAM 15 MG TAB] 90 tablet 4    Sig: TAKE ONE TABLET BY MOUTH EVERY DAY WITH A MEAL     Analgesics:  COX2 Inhibitors Failed - 06/15/2023  5:57 PM      Failed - Manual Review: Labs are only required if the patient has taken medication for more than 8 weeks.      Passed - HGB in normal range and within 360 days    Hemoglobin  Date Value Ref Range Status  04/08/2023 15.5 13.0 - 17.7 g/dL Final         Passed - Cr in normal range and within 360 days    Creat  Date Value Ref Range Status  02/16/2017 0.82 0.70 - 1.25 mg/dL Final    Comment:    For patients >62 years of age, the reference limit for Creatinine is approximately 13% higher for people identified as African-American. .    Creatinine, Ser  Date Value Ref Range Status  05/25/2023 0.92 0.76 - 1.27 mg/dL Final         Passed - HCT in normal range and within 360 days    Hematocrit  Date Value Ref Range Status  04/08/2023 46.8 37.5 - 51.0 % Final         Passed - AST in normal range and within 360 days    AST  Date Value Ref Range Status  04/08/2023 27 0 - 40 IU/L Final         Passed - ALT in normal range and within 360 days    ALT  Date Value Ref Range Status  04/08/2023 37 0 - 44 IU/L Final         Passed - eGFR is 30 or above and within 360 days    GFR, Est African American  Date Value Ref Range Status  12/15/2016 108 > OR = 60 mL/min/1.72m2 Final   GFR calc Af Amer  Date Value Ref Range Status  03/05/2020 103 >59 mL/min/1.73 Final    Comment:    **In accordance with recommendations from the NKF-ASN Task force,**   [facility] is in the process of updating its eGFR calculation to the   2021 CKD-EPI creatinine equation that estimates kidney function   without a race variable.    GFR, Est Non African American  Date Value Ref Range Status  12/15/2016 93 > OR = 60 mL/min/1.46m2 Final   GFR  calc non Af Amer  Date Value Ref Range Status  03/05/2020 89 >59 mL/min/1.73 Final   eGFR  Date Value Ref Range Status  05/25/2023 91 >59 mL/min/1.73 Final         Passed - Patient is not pregnant      Passed - Valid encounter within last 12 months    Recent Outpatient Visits           2 months ago Annual physical exam   [place] [facility] Malva Limes, MD   9 months ago Primary hypertension   [facility] Malva Limes, MD   1 year ago Annual physical exam   [facility] Malva Limes, MD   2 years ago Encounter for initial preventive physical examination covered by [facility]   [facility] Malva Limes, MD   3 years ago Annual physical exam   [place] [facility]  Malva Limes, MD       Future Appointments             In 8 months Willeen Niece, MD [facility]   In 9 months Fisher, Demetrios Isaacs, MD [facility]

## 2023-06-15 NOTE — Telephone Encounter (Signed)
 Copied from CRM [id number]. Topic: Clinical - Lab/Test Results >> Jun 15, 2023 12:35 PM Fuller Mandril wrote: Reason for CRM: Patient wife called stated patient had calcium test done weeks ago. Have received one results but have not received any update for results with 2nd part or radiologists reading. Would like to know what that 2nd result is. Thank You

## 2023-06-15 NOTE — Telephone Encounter (Signed)
 The Cardiac CT Score states: "FINDINGS: Non-cardiac: See separate report from [facility]...."  But we have not received the non-cardiac report. Please contact [facility] radiology or  [facility] to see what happened to the non-cardiac report.

## 2023-06-16 NOTE — Telephone Encounter (Signed)
 Please advise pt

## 2023-06-16 NOTE — Telephone Encounter (Signed)
 Spoke with Judeth Cornfield from CT with [facility] and said the radiologist are 3-4 weeks behind on reading reports. Right now it looks like they are getting around February 18 just to give [place] an idea where they are at.

## 2023-06-17 NOTE — Telephone Encounter (Signed)
 Spoke with Smith Robert patient's wife (on [place]) and was given message.

## 2023-07-14 ENCOUNTER — Other Ambulatory Visit: Payer: Self-pay | Admitting: Family Medicine

## 2023-07-14 DIAGNOSIS — I10 Essential (primary) hypertension: Secondary | ICD-10-CM

## 2023-07-14 DIAGNOSIS — E781 Pure hyperglyceridemia: Secondary | ICD-10-CM

## 2023-08-24 ENCOUNTER — Ambulatory Visit: Payer: Medicare PPO | Admitting: Family Medicine

## 2023-08-24 ENCOUNTER — Encounter: Payer: Self-pay | Admitting: Family Medicine

## 2023-08-24 VITALS — BP 136/86 | HR 86 | Resp 16 | Ht 70.0 in | Wt 190.3 lb

## 2023-08-24 DIAGNOSIS — I10 Essential (primary) hypertension: Secondary | ICD-10-CM | POA: Diagnosis not present

## 2023-08-29 ENCOUNTER — Other Ambulatory Visit: Payer: Self-pay | Admitting: Family Medicine

## 2023-08-29 DIAGNOSIS — K219 Gastro-esophageal reflux disease without esophagitis: Secondary | ICD-10-CM

## 2023-09-17 ENCOUNTER — Other Ambulatory Visit: Payer: Self-pay | Admitting: Family Medicine

## 2023-09-23 NOTE — Progress Notes (Signed)
 Established patient visit   Patient: Tyler Roberts   DOB: [birth year]   68 y.o. Male  MRN: [id number] Visit Date: 08/24/2023  Today's healthcare provider: Jeralene Mom, MD   Chief Complaint  Patient presents with   Follow-up    Hypertension, doing good. Average 120s/70s.   Subjective    Discussed the use of AI scribe software for clinical note transcription with the patient, who gave verbal consent to proceed.  History of Present Illness   Tyler Roberts is a 68 year old male with hypertension who presents for blood pressure follow up  His blood pressure has been well-controlled since the last visit, following an increase in the dose of valsartan -hctz. Home blood pressure readings have been in the 120s to 130s systolic and 70s to 80s diastolic. His wife regularly checks his blood pressure at home.  He denies experiencing any side effects from the medication and feels good overall. He confirms that he took his medication this morning, although his wife did not have time to check his blood pressure today due to her own doctor's appointment.   No problems or side effects from the medication.     Wt Readings from Last 3 Encounters:  08/24/23 190 lb 4.8 oz (86.3 kg)  05/25/23 194 lb 11.2 oz (88.3 kg)  04/08/23 194 lb (88 kg)   BP Readings from Last 3 Encounters:  08/24/23 136/86  05/25/23 (!) 158/80  04/08/23 (!) 153/83     Medications: Outpatient Medications Prior to Visit  Medication Sig   aspirin 81 MG tablet    flunisolide (NASALIDE) 25 MCG/ACT (0.025%) SOLN USE ONE SPRAY IN EACH NOSTRIL ONCE A DAY   lovastatin  (MEVACOR ) 20 MG tablet TAKE 1 TABLET BY MOUTH DAILY   metoprolol  succinate (TOPROL -XL) 25 MG 24 hr tablet TAKE 1 TABLET BY MOUTH ONCE DAILY   Testosterone  1.62 % GEL PLACE ONE PUMP ONTO THE SKIN DAILY AS DIRECTED   triamcinolone  cream (KENALOG ) 0.1 % APPLY TO AFFECTED AREAS OF ITCHY RASH TOPICALLY ONCE TO TWICE DAILY UNITL CLEAR THEN AS NEEDED FOR FLARES.  AVOID FACE,GROIN, AND AXILLA.   valsartan -hydrochlorothiazide  (DIOVAN -HCT) 320-25 MG tablet Take 1 tablet by mouth daily.   esomeprazole (NEXIUM) 40 MG capsule TAKE 1 CAPSULE BY MOUTH ONCE DAILY   meloxicam  (MOBIC ) 15 MG tablet TAKE ONE TABLET BY MOUTH EVERY DAY WITH A MEAL   No facility-administered medications prior to visit.   Review of Systems  Constitutional:  Negative for appetite change, chills and fever.  Respiratory:  Negative for chest tightness, shortness of breath and wheezing.   Cardiovascular:  Negative for chest pain and palpitations.  Gastrointestinal:  Negative for abdominal pain, nausea and vomiting.       Objective    BP 136/86   Pulse 86   Resp 16   Ht 5' 10 (1.778 m)   Wt 190 lb 4.8 oz (86.3 kg)   SpO2 97%   BMI 27.31 kg/m   Physical Exam   General: Appearance:    Well developed, well nourished male in no acute distress  Eyes:    PERRL, conjunctiva/corneas clear, EOM's intact       Lungs:     Clear to auscultation bilaterally, respirations unlabored  Heart:    Normal heart rate. Normal rhythm. No murmurs, rubs, or gallops.    MS:   All extremities are intact.    Neurologic:   Awake, alert, oriented x 3. No apparent focal neurological defect.  Assessment & Plan     1. Primary hypertension (Primary) Much better with increase dose of valsartan -hydrochlorothiazide . Continue current medications.    Future Appointments  Date Time Provider Department Center  02/21/2024  9:00 AM Artemio Larry, MD [facility] None  04/09/2024  8:20 AM Shann Darnel, Erlinda Haws, MD [facility]         Jeralene Mom, MD  [facility] [phone] (phone) [phone] (fax)  [facility]

## 2023-09-30 ENCOUNTER — Telehealth: Payer: Self-pay | Admitting: Family Medicine

## 2023-09-30 NOTE — Telephone Encounter (Signed)
 Spoke with patient and reports that he did request a new cpap. Scheduled patient with Dr.Fisher 10/03/23

## 2023-09-30 NOTE — Telephone Encounter (Signed)
 Fax came in from [employer] for new CPAP machine. Did patient request new CPAP from this company? We get a lot of scam requests for medical equipment and need to verify this is legitimate.  If patient requested new machine then he needs in office visit so we can send them notes verifying medical necessity.

## 2023-10-03 ENCOUNTER — Encounter: Payer: Self-pay | Admitting: Family Medicine

## 2023-10-03 ENCOUNTER — Ambulatory Visit: Admitting: Family Medicine

## 2023-10-03 VITALS — BP 151/94 | HR 94 | Resp 16 | Ht 70.0 in | Wt 191.8 lb

## 2023-10-03 DIAGNOSIS — G4733 Obstructive sleep apnea (adult) (pediatric): Secondary | ICD-10-CM | POA: Diagnosis not present

## 2023-10-03 NOTE — Progress Notes (Signed)
 Established patient visit   Patient: Tyler Roberts   DOB: Apr 01, 1956   68 y.o. Male  MRN: [id number] Visit Date: 10/03/2023  Today's healthcare provider: Nancyann Rigg, MD   Chief Complaint  Patient presents with   Obstructive Sleep Apnea    Patient requesting a new cpap machine.    Subjective    Discussed the use of AI scribe software for clinical note transcription with the patient, who gave verbal consent to proceed.  History of Present Illness   Tyler Roberts is a 68 year old male with obstructive sleep apnea who presents for a new CPAP machine.  He is seeking a new CPAP machine as his current one, obtained in 2019, is aging and intermittently turning on and off. Despite this, when functioning, it provides adequate aeration and he cannot sleep without it, even taking it on vacations. He uses it every night for about six to seven hours and feels well-rested in the morning without daytime sleepiness, whereas he was always fatigued and sleepy throughout the day prior to use of CPAP.  No chest pain, heart flutters, or shortness of breath. He sometimes experiences difficulty breathing through his nose due to allergies, leading him to breathe through his mouth frequently.       Medications: Outpatient Medications Prior to Visit  Medication Sig   aspirin 81 MG tablet    esomeprazole (NEXIUM) 40 MG capsule TAKE 1 CAPSULE BY MOUTH ONCE DAILY   flunisolide (NASALIDE) 25 MCG/ACT (0.025%) SOLN USE ONE SPRAY IN EACH NOSTRIL ONCE A DAY   lovastatin  (MEVACOR ) 20 MG tablet TAKE 1 TABLET BY MOUTH DAILY   meloxicam  (MOBIC ) 15 MG tablet TAKE ONE TABLET BY MOUTH EVERY DAY WITH A MEAL   metoprolol  succinate (TOPROL -XL) 25 MG 24 hr tablet TAKE 1 TABLET BY MOUTH ONCE DAILY   Testosterone  1.62 % GEL PLACE ONE PUMP ONTO THE SKIN DAILY AS DIRECTED   triamcinolone  cream (KENALOG ) 0.1 % APPLY TO AFFECTED AREAS OF ITCHY RASH TOPICALLY ONCE TO TWICE DAILY UNITL CLEAR THEN AS NEEDED FOR FLARES. AVOID  FACE,GROIN, AND AXILLA.   valsartan -hydrochlorothiazide  (DIOVAN -HCT) 320-25 MG tablet Take 1 tablet by mouth daily.   No facility-administered medications prior to visit.   Review of Systems     Objective    BP (!) 151/94 (BP Location: Left Arm, Patient Position: Sitting, Cuff Size: Normal)   Pulse 94   Resp 16   Ht 5' 10 (1.778 m)   Wt 191 lb 12.8 oz (87 kg)   SpO2 99%   BMI 27.52 kg/m   Physical Exam   General Appearance:    Well developed, well nourished male, alert, cooperative, in no acute distress  HENT:   ENT exam normal, no neck nodes or sinus tenderness  Eyes:    PERRL, conjunctiva/corneas clear, EOM's intact       Lungs:     Clear to auscultation bilaterally, respirations unlabored  Heart:    Normal heart rate. Normal rhythm. No murmurs, rubs, or gallops.    Neurologic:   Awake, alert, oriented x 3. No apparent focal neurological           defect.         Assessment & Plan     1. Obstructive apnea (Primary) First diagnosed in 2003 has significantly benefited from use of CPAP. Current Autop[employer] machine is 68 years old and wearing out, becoming less reliable. Have placed order for new CPAP machine to [employer]. Advised patient  we will notify him if repeat sleep study is needed.      Nancyann Maltz, MD  [facility] [phone] (phone) [phone] (fax)  [facility]

## 2023-10-05 ENCOUNTER — Telehealth: Payer: Self-pay

## 2023-10-05 NOTE — Telephone Encounter (Signed)
 Copied from CRM [id number]. Topic: Clinical - Medication Question >> Oct 05, 2023  9:57 AM Daved P wrote: Reason for CRM: Mikle from [employer] and will need prescription dated after this patient has seen Human resources officer. Also, needs sleep study to be signed.

## 2023-10-05 NOTE — Telephone Encounter (Signed)
Duplicate encounter. Information added to original encounter.

## 2023-10-05 NOTE — Telephone Encounter (Signed)
 Copied from CRM [id number]. Topic: Clinical - Medication Question >> Oct 05, 2023  9:57 AM Daved P wrote: Reason for CRM: Mikle from [employer] and will need prescription dated after this patient has seen Human resources officer. Also, needs sleep study to be signed.

## 2023-10-18 NOTE — Telephone Encounter (Signed)
 Please clarify. Is he requesting an order for a new sleep study to be done? Or is he requesting that I put my signature on the sleep study that was done in 2008? Or 2003?

## 2023-10-18 NOTE — Telephone Encounter (Signed)
 Copied from CRM [id number]. Topic: Clinical - Request for Lab/Test Order >> Oct 17, 2023  4:40 PM Winona R wrote: Drex from [employer] calling to request a signature for the pts sleep study as there is no signature and the pt is in need of a CPAP machine. The provider who performed the test is no longer within the practice. I informed the rep it may not be possible however we will see what we can do. Drex informed me that the Pts wife asked him to call the office to see if it can be signed. If any the office has any further questions please give the pt or [employer] a call at [phone]

## 2023-10-19 NOTE — Telephone Encounter (Signed)
 Called [employer] [facility] and spoke with Ronal and per [facility] the sleep study is good for a replacement but they would like Dr.Fisher to sign it and also last office visit notes to be fax to [phone] stated no need to put attention to anyone.

## 2023-12-06 DIAGNOSIS — M5416 Radiculopathy, lumbar region: Secondary | ICD-10-CM | POA: Diagnosis not present

## 2023-12-22 DIAGNOSIS — M25551 Pain in right hip: Secondary | ICD-10-CM | POA: Diagnosis not present

## 2023-12-27 ENCOUNTER — Telehealth: Payer: Self-pay

## 2023-12-27 NOTE — Telephone Encounter (Signed)
 Patient's spouse called today asking if he could do another Red Light therapy treatment before his yearly follow up in November?   [phone]

## 2023-12-27 NOTE — Telephone Encounter (Signed)
 Patient scheduled. aw

## 2024-01-09 ENCOUNTER — Ambulatory Visit

## 2024-01-09 ENCOUNTER — Ambulatory Visit: Admitting: Dermatology

## 2024-01-09 DIAGNOSIS — L570 Actinic keratosis: Secondary | ICD-10-CM

## 2024-01-09 MED ORDER — AMINOLEVULINIC ACID HCL 10 % EX GEL
2000.0000 mg | Freq: Once | CUTANEOUS | Status: AC
  Administered 2024-01-09: 2000 mg via TOPICAL

## 2024-01-09 NOTE — Patient Instructions (Signed)

## 2024-01-09 NOTE — Progress Notes (Signed)
   Follow-Up Visit   Subjective  Tyler Roberts is a 68 y.o. male who presents for the following: patient here for PDT    The following portions of the chart were reviewed this encounter and updated as appropriate: medications, allergies, medical history  Review of Systems:  No other skin or systemic complaints except as noted in HPI or Assessment and Plan.  Objective  Well appearing patient in no apparent distress; mood and affect are within normal limits.   A focused examination was performed of the following areas:face   Relevant exam findings are noted in the Assessment and Plan.  R paranasal x 1 Erythematous thin papules/macules with gritty scale.   Assessment & Plan   AK (ACTINIC KERATOSIS)   HYPERTROPHIC ACTINIC KERATOSIS R paranasal x 1 Destruction of lesion - R paranasal x 1  Destruction method: cryotherapy   Informed consent: discussed and consent obtained   Timeout:  patient name, date of birth, surgical site, and procedure verified Lesion destroyed using liquid nitrogen: Yes   Region frozen until ice ball extended beyond lesion: Yes   Outcome: patient tolerated procedure well with no complications   Post-procedure details: wound care instructions given   Additional details:  Prior to procedure, discussed risks of blister formation, small wound, skin dyspigmentation, or rare scar following cryotherapy. Recommend Vaseline ointment to treated areas while healing.    Patient completed red light phototherapy with debridement today to face.  ACTINIC KERATOSES Exam: Erythematous thin papules/macules with gritty scale.  Treatment Plan:  Red Light Photodynamic therapy  Procedure discussed: discussed risks, benefits, side effects. and alternatives   Prep: site scrubbed/prepped with acetone   Debridement needed: Yes (performed by Physician with sand paper.  (CPT Z9623563)) Location:  face Number of lesions:  Multiple (> 15) Type of treatment:  Red  light Aminolevulinic Acid (see MAR for details): Ameluz  Aminolevulinic Acid comment:  [id number] Amount of Ameluz  (mg):  1 Incubation time (minutes):  60 Number of minutes under lamp:  20 Cooling:  Fan Outcome: patient tolerated procedure well with no complications   Post-procedure details: sunscreen applied and aftercare instructions given to patient    Related Medications Aminolevulinic Acid HCl 10 % GEL 2,000 mg  Amanda White, RMA  Documentation: I have reviewed the above documentation for accuracy and completeness, and I agree with the above.  Rexene Rattler, MD

## 2024-01-09 NOTE — Progress Notes (Signed)
 Tyler Roberts                                          MRN: [id number]   01/09/2024   The [facility] Quality Team Specialist reviewed this patient medical record for the purposes of chart review for care gap closure. The following were reviewed: chart review for care gap closure-controlling blood pressure.    [facility]I Quality Team

## 2024-01-09 NOTE — Progress Notes (Deleted)
 R paranasal - AK LN2  Patient completed red light phototherapy with debridement today.  ACTINIC KERATOSES Exam: Erythematous thin papules/macules with gritty scale.  Treatment Plan:  Red Light Photodynamic therapy  Procedure discussed: discussed risks, benefits, side effects. and alternatives   Prep: site scrubbed/prepped with acetone   Debridement needed: Yes (performed by Physician with sand paper.  (CPT [id number])) Location:  face Number of lesions:  Multiple (> 15) Type of treatment:  Red light Aminolevulinic Acid (see MAR for details): Ameluz  Aminolevulinic Acid comment:  [id number] Amount of Ameluz  (mg):  1 Incubation time (minutes):  60 Number of minutes under lamp:  20 Cooling:  Fan Outcome: patient tolerated procedure well with no complications   Post-procedure details: sunscreen applied and aftercare instructions given to patient    Related Medications Aminolevulinic Acid HCl 10 % GEL 2,000 mg  Laddie Math, RMA  Documentation: I have reviewed the above documentation for accuracy and completeness, and I agree with the above.

## 2024-01-19 ENCOUNTER — Other Ambulatory Visit: Payer: Self-pay | Admitting: Family Medicine

## 2024-01-19 DIAGNOSIS — E349 Endocrine disorder, unspecified: Secondary | ICD-10-CM

## 2024-01-19 NOTE — Telephone Encounter (Signed)
 LOV- 10/03/2023 NOV- 04/09/2024 LRF- Testosterone  1.62 % GEL [[id number]]   Order Details Dose, Route, Frequency: As Directed  Dispense Quantity: 150 g Refills: 5   Duration: -- Dispense As Written: No   Note to Pharmacy: PT RQUST REFILLS PLEASE       Sig: PLACE ONE PUMP ONTO THE SKIN DAILY AS DIRECTED

## 2024-02-11 ENCOUNTER — Other Ambulatory Visit: Payer: Self-pay | Admitting: Family Medicine

## 2024-02-11 DIAGNOSIS — I10 Essential (primary) hypertension: Secondary | ICD-10-CM

## 2024-02-21 ENCOUNTER — Encounter: Payer: Self-pay | Admitting: Dermatology

## 2024-02-21 ENCOUNTER — Ambulatory Visit: Payer: Medicare PPO | Admitting: Dermatology

## 2024-02-21 DIAGNOSIS — L814 Other melanin hyperpigmentation: Secondary | ICD-10-CM

## 2024-02-21 DIAGNOSIS — D492 Neoplasm of unspecified behavior of bone, soft tissue, and skin: Secondary | ICD-10-CM

## 2024-02-21 DIAGNOSIS — L820 Inflamed seborrheic keratosis: Secondary | ICD-10-CM | POA: Diagnosis not present

## 2024-02-21 DIAGNOSIS — C44321 Squamous cell carcinoma of skin of nose: Secondary | ICD-10-CM

## 2024-02-21 DIAGNOSIS — W908XXA Exposure to other nonionizing radiation, initial encounter: Secondary | ICD-10-CM | POA: Diagnosis not present

## 2024-02-21 DIAGNOSIS — L821 Other seborrheic keratosis: Secondary | ICD-10-CM

## 2024-02-21 DIAGNOSIS — Z1283 Encounter for screening for malignant neoplasm of skin: Secondary | ICD-10-CM | POA: Diagnosis not present

## 2024-02-21 DIAGNOSIS — C4492 Squamous cell carcinoma of skin, unspecified: Secondary | ICD-10-CM

## 2024-02-21 DIAGNOSIS — L570 Actinic keratosis: Secondary | ICD-10-CM

## 2024-02-21 DIAGNOSIS — D1801 Hemangioma of skin and subcutaneous tissue: Secondary | ICD-10-CM

## 2024-02-21 DIAGNOSIS — D229 Melanocytic nevi, unspecified: Secondary | ICD-10-CM

## 2024-02-21 DIAGNOSIS — L578 Other skin changes due to chronic exposure to nonionizing radiation: Secondary | ICD-10-CM

## 2024-02-21 HISTORY — DX: Squamous cell carcinoma of skin, unspecified: C44.92

## 2024-02-21 NOTE — Patient Instructions (Addendum)
 Cryotherapy Aftercare  Wash gently with soap and water everyday.   Apply Vaseline Jelly daily until healed.    Wound Care Instructions  Cleanse wound gently with soap and water once a day then pat dry with clean gauze. Apply a thin coat of Petrolatum (petroleum jelly, Vaseline) over the wound (unless you have an allergy to this). We recommend that you use a new, sterile tube of Vaseline. Do not pick or remove scabs. Do not remove the yellow or white healing tissue from the base of the wound.  Cover the wound with fresh, clean, nonstick gauze and secure with paper tape. You may use Band-Aids in place of gauze and tape if the wound is small enough, but would recommend trimming much of the tape off as there is often too much. Sometimes Band-Aids can irritate the skin.  You should call the office for your biopsy report after 1 week if you have not already been contacted.  If you experience any problems, such as abnormal amounts of bleeding, swelling, significant bruising, significant pain, or evidence of infection, please call the office immediately.  FOR ADULT SURGERY PATIENTS: If you need something for pain relief you may take 1 extra strength Tylenol  (acetaminophen ) AND 2 Ibuprofen (200mg  each) together every 4 hours as needed for pain. (do not take these if you are allergic to them or if you have a reason you should not take them.) Typically, you may only need pain medication for 1 to 3 days.      Recommend daily broad spectrum sunscreen SPF 30+ to sun-exposed areas, reapply every 2 hours as needed. Call for new or changing lesions.  Staying in the shade or wearing long sleeves, sun glasses (UVA+UVB protection) and wide brim hats (4-inch brim around the entire circumference of the hat) are also recommended for sun protection.      Melanoma ABCDEs  Melanoma is the most dangerous type of skin cancer, and is the leading cause of death from skin disease.  You are more likely to develop  melanoma if you: Have light-colored skin, light-colored eyes, or red or blond hair Spend a lot of time in the sun Tan regularly, either outdoors or in a tanning bed Have had blistering sunburns, especially during childhood Have a close family member who has had a melanoma Have atypical moles or large birthmarks  Early detection of melanoma is key since treatment is typically straightforward and cure rates are extremely high if we catch it early.   The first sign of melanoma is often a change in a mole or a new dark spot.  The ABCDE system is a way of remembering the signs of melanoma.  A for asymmetry:  The two halves do not match. B for border:  The edges of the growth are irregular. C for color:  A mixture of colors are present instead of an even brown color. D for diameter:  Melanomas are usually (but not always) greater than 6mm - the size of a pencil eraser. E for evolution:  The spot keeps changing in size, shape, and color.  Please check your skin once per month between visits. You can use a small mirror in front and a large mirror behind you to keep an eye on the back side or your body.   If you see any new or changing lesions before your next follow-up, please call to schedule a visit.  Please continue daily skin protection including broad spectrum sunscreen SPF 30+ to sun-exposed areas, reapplying every  2 hours as needed when you're outdoors.   Staying in the shade or wearing long sleeves, sun glasses (UVA+UVB protection) and wide brim hats (4-inch brim around the entire circumference of the hat) are also recommended for sun protection.      Due to recent changes in healthcare laws, you may see results of your pathology and/or laboratory studies on MyChart before the doctors have had a chance to review them. We understand that in some cases there may be results that are confusing or concerning to you. Please understand that not all results are received at the same time and often  the doctors may need to interpret multiple results in order to provide you with the best plan of care or course of treatment. Therefore, we ask that you please give us  2 business days to thoroughly review all your results before contacting the office for clarification. Should we see a critical lab result, you will be contacted sooner.   If You Need Anything After Your Visit  If you have any questions or concerns for your doctor, please call our main line at [phone] and press option 4 to reach your doctor's medical assistant. If no one answers, please leave a voicemail as directed and we will return your call as soon as possible. Messages left after 4 pm will be answered the following business day.   You may also send us  a message via MyChart. We typically respond to MyChart messages within 1-2 business days.  For prescription refills, please ask your pharmacy to contact our office. Our fax number is [phone].  If you have an urgent issue when the clinic is closed that cannot wait until the next business day, you can page your doctor at the number below.    Please note that while we do our best to be available for urgent issues outside of office hours, we are not available 24/7.   If you have an urgent issue and are unable to reach us , you may choose to seek medical care at your doctor's office, retail clinic, urgent care center, or emergency room.  If you have a medical emergency, please immediately call 911 or go to the emergency department.  Pager Numbers  - Dr. Hester: [phone]  - Dr. Jackquline: [phone]  - Dr. Claudene: [phone]   - Dr. Raymund: [phone]  In the event of inclement weather, please call our main line at [phone] for an update on the status of any delays or closures.  Dermatology Medication Tips: Please keep the boxes that topical medications come in in order to help keep track of the instructions about where and how to use these. Pharmacies  typically print the medication instructions only on the boxes and not directly on the medication tubes.   If your medication is too expensive, please contact our office at [phone] option 4 or send us  a message through MyChart.   We are unable to tell what your co-pay for medications will be in advance as this is different depending on your insurance coverage. However, we may be able to find a substitute medication at lower cost or fill out paperwork to get insurance to cover a needed medication.   If a prior authorization is required to get your medication covered by your insurance company, please allow us  1-2 business days to complete this process.  Drug prices often vary depending on where the prescription is filled and some pharmacies may offer cheaper prices.  The website www.goodrx.com contains coupons for medications through  different pharmacies. The prices here do not account for what the cost may be with help from insurance (it may be cheaper with your insurance), but the website can give you the price if you did not use any insurance.  - You can print the associated coupon and take it with your prescription to the pharmacy.  - You may also stop by our office during regular business hours and pick up a GoodRx coupon card.  - If you need your prescription sent electronically to a different pharmacy, notify our office through [facility] or by phone at [phone] option 4.     Si Usted Necesita Algo Despus de Su Visita  Tambin puede enviarnos un mensaje a travs de Clinical cytogeneticist. Por lo general respondemos a los mensajes de MyChart en el transcurso de 1 a 2 das hbiles.  Para renovar recetas, por favor pida a su farmacia que se ponga en contacto con nuestra oficina. Randi lakes de fax es [place] [phone].  Si tiene un asunto urgente cuando la clnica est cerrada y que no puede esperar hasta el siguiente da hbil, puede llamar/localizar a su doctor(a) al nmero que  aparece a continuacin.   Por favor, tenga en cuenta que aunque hacemos todo lo posible para estar disponibles para asuntos urgentes fuera del horario de [place], no estamos disponibles las 24 horas del da, los 7 [address]  [address] de la [place].   Si tiene un problema urgente y no puede comunicarse con nosotros, puede optar por buscar atencin mdica  en el consultorio de su doctor(a), en una clnica privada, en un centro de atencin urgente o en una sala de emergencias.  Si tiene Engineer, drilling, por favor llame inmediatamente al 911 o vaya a la sala de emergencias.  Nmeros de bper  - Dr. Hester: [phone]  - Dra. Jackquline: [phone]  - Dr. Claudene: [phone]  - Dra. Kitts: [phone]  En caso de inclemencias del [place], por favor llame a nuestra lnea principal al [phone] para una actualizacin sobre el estado de cualquier retraso o cierre.  Consejos para la medicacin en dermatologa: Por favor, guarde las cajas en las que vienen los medicamentos de uso tpico para ayudarle a seguir las instrucciones sobre dnde y cmo usarlos. Las farmacias generalmente imprimen las instrucciones del medicamento slo en las cajas y no directamente en los tubos del [place].   Si su medicamento es muy caro, por favor, pngase en contacto con landry rieger llamando al [phone] y presione la opcin 4 o envenos un mensaje a travs de Clinical cytogeneticist.   No podemos decirle cul ser su copago por los medicamentos por adelantado ya que esto es diferente dependiendo de la cobertura de su seguro. Sin embargo, es posible que podamos encontrar un medicamento sustituto a Audiological scientist un formulario para que el seguro cubra el medicamento que se considera necesario.   Si se requiere una autorizacin previa para que su compaa de seguros malta su medicamento, por favor permtanos de 1 a 2 das hbiles para completar este proceso.  Los precios de los medicamentos varan con frecuencia  dependiendo del Environmental consultant de dnde se surte la receta y alguna farmacias pueden ofrecer precios ms baratos.  El sitio web www.goodrx.com tiene cupones para medicamentos de Health and safety inspector. Los precios aqu no tienen en cuenta lo que podra costar con la ayuda del seguro (puede ser ms barato con su seguro), pero el sitio web puede darle el precio si no utiliz Tourist information centre manager.  - Puede imprimir el  cupn correspondiente y llevarlo con su receta a la farmacia.  - Tambin puede pasar por nuestra oficina durante el horario de atencin regular y Education officer, museum una tarjeta de cupones de GoodRx.  - Si necesita que su receta se enve electrnicamente a una farmacia diferente, informe a nuestra oficina a travs de MyChart de [place] o por telfono llamando al [phone] y presione la opcin 4.

## 2024-02-21 NOTE — Progress Notes (Signed)
 Follow-Up Visit   Subjective  Tyler Roberts is a 68 y.o. male who presents for the following: Skin Cancer Screening and Upper Body Skin Exam. No personal Hx of skin cancer. Hx of AKs. Hx of PDT treatment in the past.  Concerned with lesion on right tip of nose. Uses CPAP machine. Dur: >1 year.  Also spot on neck gets irritated.  The patient presents for Upper Body Skin Exam (UBSE) for skin cancer screening and mole check. The patient has spots, moles and lesions to be evaluated, some may be new or changing and the patient may have concern these could be cancer.    The following portions of the chart were reviewed this encounter and updated as appropriate: medications, allergies, medical history  Review of Systems:  No other skin or systemic complaints except as noted in HPI or Assessment and Plan.  Objective  Well appearing patient in no apparent distress; mood and affect are within normal limits.  All skin waist up examined. Relevant physical exam findings are noted in the Assessment and Plan.  Right Malar Cheek x1, L temple x1, R forearm x2 (4) Erythematous thin papules/macules with gritty scale.  Left Anterior Neck x1 Erythematous keratotic or waxy stuck-on papule or plaque. Right Tip of Nose 7 mm blanching pink macule   Assessment & Plan   AK (ACTINIC KERATOSIS) (4) Right Malar Cheek x1, L temple x1, R forearm x2 (4) Actinic keratoses are precancerous spots that appear secondary to cumulative UV radiation exposure/sun exposure over time. They are chronic with expected duration over 1 year. A portion of actinic keratoses will progress to squamous cell carcinoma of the skin. It is not possible to reliably predict which spots will progress to skin cancer and so treatment is recommended to prevent development of skin cancer.  Recommend daily broad spectrum sunscreen SPF 30+ to sun-exposed areas, reapply every 2 hours as needed.  Recommend staying in the shade or wearing long  sleeves, sun glasses (UVA+UVB protection) and wide brim hats (4-inch brim around the entire circumference of the hat). Call for new or changing lesions. Destruction of lesion - Right Malar Cheek x1, L temple x1, R forearm x2 (4)  Destruction method: cryotherapy   Informed consent: discussed and consent obtained   Lesion destroyed using liquid nitrogen: Yes   Region frozen until ice ball extended beyond lesion: Yes   Outcome: patient tolerated procedure well with no complications   Post-procedure details: wound care instructions given   Additional details:  Prior to procedure, discussed risks of blister formation, small wound, skin dyspigmentation, or rare scar following cryotherapy. Recommend Vaseline ointment to treated areas while healing.   INFLAMED SEBORRHEIC KERATOSIS Left Anterior Neck x1 Symptomatic, irritating, patient would like treated. Destruction of lesion - Left Anterior Neck x1  Destruction method: cryotherapy   Informed consent: discussed and consent obtained   Lesion destroyed using liquid nitrogen: Yes   Region frozen until ice ball extended beyond lesion: Yes   Outcome: patient tolerated procedure well with no complications   Post-procedure details: wound care instructions given   Additional details:  Prior to procedure, discussed risks of blister formation, small wound, skin dyspigmentation, or rare scar following cryotherapy. Recommend Vaseline ointment to treated areas while healing.   NEOPLASM OF SKIN Right Tip of Nose Skin / nail biopsy Type of biopsy: tangential   Informed consent: discussed and consent obtained   Anesthesia: the lesion was anesthetized in a standard fashion   Anesthesia comment:  Area prepped  with alcohol Anesthetic:  1% lidocaine w/ epinephrine 1-100,000 buffered w/ 8.4% NaHCO3 Instrument used: flexible razor blade   Hemostasis achieved with: pressure, aluminum chloride and electrodesiccation   Outcome: patient tolerated procedure well    Post-procedure details: wound care instructions given   Post-procedure details comment:  Ointment and small bandage applied  Specimen 1 - Surgical pathology Differential Diagnosis: AK vs telangiectasia, R/O BCC  Check Margins: No  Skin cancer screening performed today.  Actinic Damage - Chronic condition, secondary to cumulative UV/sun exposure - diffuse scaly erythematous macules with underlying dyspigmentation - Recommend daily broad spectrum sunscreen SPF 30+ to sun-exposed areas, reapply every 2 hours as needed.  - Staying in the shade or wearing long sleeves, sun glasses (UVA+UVB protection) and wide brim hats (4-inch brim around the entire circumference of the hat) are also recommended for sun protection.  - Call for new or changing lesions.  Lentigines, Seborrheic Keratoses, Hemangiomas - Benign normal skin lesions - Benign-appearing - Call for any changes  Melanocytic Nevi - Tan-brown and/or pink-flesh-colored symmetric macules and papules - Benign appearing on exam today - Observation - Call clinic for new or changing moles - Recommend daily use of broad spectrum spf 30+ sunscreen to sun-exposed areas.     Return in about 1 year (around 02/20/2025) for TBSE, HxAKs.  I, Jill Parcell, CMA, am acting as scribe for Rexene Rattler, MD.   Documentation: I have reviewed the above documentation for accuracy and completeness, and I agree with the above.  Rexene Rattler, MD

## 2024-02-22 ENCOUNTER — Ambulatory Visit: Payer: Self-pay | Admitting: Dermatology

## 2024-02-22 ENCOUNTER — Encounter: Payer: Self-pay | Admitting: Dermatology

## 2024-02-22 LAB — SURGICAL PATHOLOGY

## 2024-02-22 MED ORDER — FLUOROURACIL 5 % EX CREA: TOPICAL_CREAM | Freq: Two times a day (BID) | CUTANEOUS | 1 refills | Status: AC

## 2024-02-22 NOTE — Telephone Encounter (Signed)
 Advised pt of bx results and discussed treatment.  Advised pt 5FU/Calcipotriene would be sent to [facility].  Advised pt I would send mychart message with this information in it./sh

## 2024-02-22 NOTE — Telephone Encounter (Signed)
-----   Message from Rexene Rattler sent at 02/22/2024  4:40 PM EST -----  1. Skin, right tip of nose :       WELL DIFFERENTIATED SQUAMOUS CELL CARCINOMA WITH SUPERFICIAL INFILTRATION, SEE       DESCRIPTION  Thin SCC skin cancer.  I would like him to use 5FU/Vit D (send Rx to [facility]) cream bid x 2-4 weeks until area is raw/scabbed, then can stop and use vaseline ointment until healed.  Will recheck area in 6  weeks and may repeat treatment if needed.- please call patient  5-fluorouracil/calcipotriene cream is is a type of field treatment used to treat precancers, thin skin cancers, and areas of sun damage. Expected reaction includes irritation and mild inflammation  potentially progressing to more severe inflammation including redness, scaling, crusting and open sores/erosions.  If too much irritation occurs, ensure application of only a thin layer and decrease  frequency of use to achieve a tolerable level of inflammation. Recommend applying Vaseline ointment to open sores as needed.  Minimize sun exposure while under treatment. Recommend daily broad  spectrum sunscreen SPF 30+ to sun-exposed areas, reapply every 2 hours as needed.      ----- Message ----- From: Interface, Lab In [facility] [place] Sent: 02/22/2024   4:27 PM EST To: Rexene Rattler, MD

## 2024-02-29 DIAGNOSIS — M5416 Radiculopathy, lumbar region: Secondary | ICD-10-CM | POA: Diagnosis not present

## 2024-03-17 ENCOUNTER — Other Ambulatory Visit: Payer: Self-pay | Admitting: Family Medicine

## 2024-04-09 ENCOUNTER — Encounter: Payer: Self-pay | Admitting: Family Medicine

## 2024-04-09 ENCOUNTER — Ambulatory Visit (INDEPENDENT_AMBULATORY_CARE_PROVIDER_SITE_OTHER): Payer: Self-pay | Admitting: Family Medicine

## 2024-04-09 VITALS — BP 152/79 | HR 83 | Resp 16 | Ht 70.0 in | Wt 196.0 lb

## 2024-04-09 DIAGNOSIS — Z0001 Encounter for general adult medical examination with abnormal findings: Secondary | ICD-10-CM

## 2024-04-09 DIAGNOSIS — E291 Testicular hypofunction: Secondary | ICD-10-CM

## 2024-04-09 DIAGNOSIS — G4733 Obstructive sleep apnea (adult) (pediatric): Secondary | ICD-10-CM

## 2024-04-09 DIAGNOSIS — I10 Essential (primary) hypertension: Secondary | ICD-10-CM

## 2024-04-09 DIAGNOSIS — Z125 Encounter for screening for malignant neoplasm of prostate: Secondary | ICD-10-CM

## 2024-04-09 DIAGNOSIS — Z0000 Encounter for general adult medical examination without abnormal findings: Secondary | ICD-10-CM

## 2024-04-09 DIAGNOSIS — K219 Gastro-esophageal reflux disease without esophagitis: Secondary | ICD-10-CM | POA: Diagnosis not present

## 2024-04-09 DIAGNOSIS — Z23 Encounter for immunization: Secondary | ICD-10-CM

## 2024-04-09 DIAGNOSIS — E781 Pure hyperglyceridemia: Secondary | ICD-10-CM

## 2024-04-09 NOTE — Patient Instructions (Signed)
 SABRA  Please review the attached list of medications and notify my office if there are any errors.   . Please bring all of your medications to every appointment so we can make sure that our medication list is the same as yours.

## 2024-04-09 NOTE — Progress Notes (Signed)
 "  Chief Complaint  Patient presents with   Annual Exam    CPE.SABRA No other concerns     Subjective:   BENAIAH BEHAN is a 69 y.o. male who presents for a Medicare Annual Wellness Visit.  Visit info / Clinical Intake: Medicare Wellness Visit Type:: Subsequent Annual Wellness Visit Persons participating in visit and providing information:: patient Medicare Wellness Visit Mode:: In-person (required for WTM) Interpreter Needed?: No Pre-visit prep was completed: yes Living arrangements:: lives with spouse/significant other Patient's Overall Health Status Rating: excellent Typical amount of pain: none Are you currently prescribed opioids?: no  Dietary Habits and Nutritional Risks Eats fruit and vegetables daily?: yes Most meals are obtained by: preparing own meals; eating out In the last 2 weeks, have you had any of the following?: none Diabetic:: no  Functional Status Activities of Daily Living (to include ambulation/medication): Independent Ambulation: Independent Medication Administration: Independent Home Management (perform basic housework or laundry): Independent Manage your own finances?: yes Primary transportation is: driving Concerns about vision?: no *vision screening is required for WTM* Concerns about hearing?: no  Fall Screening Falls in the past year?: 0 Number of falls in past year: 0 Was there an injury with Fall?: 0 Fall Risk Category Calculator: 0 Patient Fall Risk Level: Low Fall Risk  Fall Risk Patient at Risk for Falls Due to: No Fall Risks  Cognitive Assessment Difficulty concentrating, remembering, or making decisions? : no Will 6CIT or Mini Cog be Completed: no 6CIT or Mini Cog Declined: patient alert, oriented, able to answer questions appropriately and recall recent events  Reviewed/Updated  Reviewed/Updated: Reviewed All (Medical, Surgical, Family, Medications, Allergies, Care Teams, Patient Goals)    Allergies (verified) Amlodipine,  Atorvastatin, Fenofibrate, Gemfibrozil, Niacin, and Benazepril    Current Medications (verified) Outpatient Encounter Medications as of 04/09/2024  Medication Sig   aspirin 81 MG tablet    esomeprazole (NEXIUM) 40 MG capsule TAKE 1 CAPSULE BY MOUTH ONCE DAILY   flunisolide (NASALIDE) 25 MCG/ACT (0.025%) SOLN USE ONE SPRAY IN EACH NOSTRIL ONCE A DAY   fluorouracil  (EFUDEX ) 5 % cream Apply topically 2 (two) times daily. Bid for 2 to 4 weeks to right nose tip until area raw, scabbed then stop   lovastatin  (MEVACOR ) 20 MG tablet TAKE 1 TABLET BY MOUTH DAILY   meloxicam  (MOBIC ) 15 MG tablet TAKE ONE TABLET BY MOUTH EVERY DAY WITH A MEAL   metoprolol  succinate (TOPROL -XL) 25 MG 24 hr tablet TAKE 1 TABLET BY MOUTH ONCE DAILY   Testosterone  1.62 % GEL PLACE ONE PUMP ONTO THE SKIN DAILY AS DIRECTED   triamcinolone  cream (KENALOG ) 0.1 % APPLY TO AFFECTED AREAS OF ITCHY RASH TOPICALLY ONCE TO TWICE DAILY UNITL CLEAR THEN AS NEEDED FOR FLARES. AVOID FACE,GROIN, AND AXILLA.   valsartan -hydrochlorothiazide  (DIOVAN -HCT) 320-25 MG tablet TAKE 1 TABLET BY MOUTH DAILY.   No facility-administered encounter medications on file as of 04/09/2024.    History: Past Medical History:  Diagnosis Date   Actinic keratosis    Shingles 09/26/2014   Squamous cell carcinoma of skin 02/21/2024   Right Tip of Nose, pt to start 5FU/Calcipotriene recheck on f/u   Past Surgical History:  Procedure Laterality Date   HERNIA REPAIR  1990   KNEE SURGERY Right 2008   Due to a staph infection   LASIK     NOSE SURGERY  2002   TONSILLECTOMY AND ADENOIDECTOMY  2002   Family History  Problem Relation Age of Onset   Anuerysm Mother  Brain Aneurysm   Heart attack Father    Heart disease Father 75 - 57   Ovarian cancer Sister    Healthy Brother    Healthy Sister    Healthy Sister    Social History   Occupational History   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[employer]  Tobacco Use    Smoking status: Former    Types: Cigars    Quit date: 04/05/2017    Years since quitting: 7.0   Smokeless tobacco: Never   Tobacco comments:    occasionally smoked intermittenlty for a few years  Substance and Sexual Activity   Alcohol use: Yes    Alcohol/week: 0.0 standard drinks of alcohol    Comment: Occasional   Drug use: No   Sexual activity: Not on file   Tobacco Counseling Counseling given: Not Answered Tobacco comments: occasionally smoked intermittenlty for a few years  SDOH Screenings   Food Insecurity: No Food Insecurity (04/08/2023)  Housing: Low Risk (04/08/2023)  Transportation Needs: No Transportation Needs (04/08/2023)  Utilities: Not At Risk (04/08/2023)  Alcohol Screen: Low Risk (04/14/2022)  Depression (PHQ2-9): Low Risk (04/09/2024)  Financial Resource Strain: Low Risk (04/08/2023)  Physical Activity: Sufficiently Active (04/08/2023)  Social Connections: Moderately Isolated (04/08/2023)  Stress: No Stress Concern Present (04/08/2023)  Tobacco Use: Medium Risk (04/09/2024)  Health Literacy: Adequate Health Literacy (04/08/2023)   See flowsheets for full screening details  Depression Screen PHQ 2 & 9 Depression Scale- Over the past 2 weeks, how often have you been bothered by any of the following problems? Little interest or pleasure in doing things: 0 Feeling down, depressed, or hopeless (PHQ Adolescent also includes...irritable): 0 PHQ-2 Total Score: 0 Trouble falling or staying asleep, or sleeping too much: 0 Feeling tired or having little energy: 0 Poor appetite or overeating (PHQ Adolescent also includes...weight loss): 0 Feeling bad about yourself - or that you are a failure or have let yourself or your family down: 0 Trouble concentrating on things, such as reading the newspaper or watching television (PHQ Adolescent also includes...like school work): 0 Moving or speaking so slowly that other people could have noticed. Or the opposite - being so fidgety or restless that  you have been moving around a lot more than usual: 0 Thoughts that you would be better off dead, or of hurting yourself in some way: 0 PHQ-9 Total Score: 0 If you checked off any problems, how difficult have these problems made it for you to do your work, take care of things at home, or get along with other people?: Not difficult at all     Goals Addressed   None          Objective:    Today's Vitals   04/09/24 0815  BP: (!) 152/79  Pulse: 83  Resp: 16  SpO2: 100%  Weight: 196 lb (88.9 kg)  Height: 5' 10 (1.778 m)   Body mass index is 28.12 kg/m.  Hearing/Vision screen No results found. Immunizations and Health Maintenance Health Maintenance  Topic Date Due   Influenza Vaccine  11/04/2023   COVID-19 Vaccine (4 - 2025-26 season) 12/05/2023   Medicare Annual Wellness (AWV)  04/09/2025   Colonoscopy  07/07/2026   DTaP/Tdap/Td (3 - Td or Tdap) 12/23/2027   Pneumococcal Vaccine: 50+ Years  Completed   Hepatitis C Screening  Completed   Zoster Vaccines- Shingrix   Completed   Meningococcal B Vaccine  Aged Out        Assessment/Plan:  This is a routine wellness examination for Georgie.  Patient Care Team: Gasper Nancyann BRAVO, MD as PCP - General (Family Medicine) Jackquline Sawyer, MD (Dermatology) Letha Cancer, MD as Consulting Physician (Physical Medicine and Rehabilitation) [place], [facility] [place], Teodoro K, MD as Consulting Physician (Gastroenterology)  I have personally reviewed and noted the following in the patients chart:   Medical and social history Use of alcohol, tobacco or illicit drugs  Current medications and supplements including opioid prescriptions. Functional ability and status Nutritional status Physical activity Advanced directives List of other physicians Hospitalizations, surgeries, and ER visits in previous 12 months Vitals Screenings to include cognitive, depression, and falls Referrals and appointments  Orders Placed This  Encounter  Procedures   CBC   Comprehensive metabolic panel with GFR   Lipid panel   PSA Total (Reflex To Free)   Testosterone ,Free and Total   In addition, I have reviewed and discussed with patient certain preventive protocols, quality metrics, and best practice recommendations. A written personalized care plan for preventive services as well as general preventive health recommendations were provided to patient.   Nancyann Gasper, MD   04/09/2024   Return in about 1 year (around 04/09/2025) for Annual Wellness Visit.  After Visit Summary: (In Person-Declined) Patient declined AVS at this time.  "

## 2024-04-09 NOTE — Progress Notes (Signed)
 "    Complete physical exam   Patient: Tyler Roberts   DOB: 1955/06/21   69 y.o. Male  MRN: [id number] Visit Date: 04/09/2024  Today's healthcare provider: Nancyann Parr, MD   Chief Complaint  Patient presents with   Annual Exam    CPE.SABRA No other concerns   Subjective    Discussed the use of AI scribe software for clinical note transcription with the patient, who gave verbal consent to proceed.  History of Present Illness   Tyler Roberts is a 69 year old male who presents for a routine physical exam.  He has a history of skin cancer on the nose and is currently using a topical cream for treatment. He has a follow-up appointment scheduled for January 12th.  He has hypertension and monitors his blood pressure at home, with readings typically in the range of 130s/70s to 80s. He describes his blood pressure as 'not perfect, but not bad.' He is currently taking metoprolol  and Diovan  HCT 320/25 mg. No chest pain, heart flutters, or shortness of breath.  He has hyperlipidemia and underwent a coronary calcium score last February which was 0.   For hypotestosteronism, he uses a testosterone  gel once daily, which he reports is effective, with improved mood and energy levels.  He is taking Nexium, which he reports is effective. No stomach problems or cramping.  He has not received a flu shot this year due to concerns about its effectiveness.     Lab Results  Component Value Date   CHOL 185 04/08/2023   HDL 41 04/08/2023   LDLCALC 89 04/08/2023   TRIG 333 (H) 04/08/2023   CHOLHDL 4.5 04/08/2023   Lab Results  Component Value Date   NA 142 05/25/2023   K 4.1 05/25/2023   CREATININE 0.92 05/25/2023   EGFR 91 05/25/2023   GLUCOSE 110 (H) 05/25/2023   Lab Results  Component Value Date   TESTOSTERONE  264 04/08/2023       Past Medical History:  Diagnosis Date   Actinic keratosis    Shingles 09/26/2014   Squamous cell carcinoma of skin 02/21/2024   Right Tip of Nose, pt to  start 5FU/Calcipotriene recheck on f/u   Past Surgical History:  Procedure Laterality Date   HERNIA REPAIR  1990   KNEE SURGERY Right 2008   Due to a staph infection   LASIK     NOSE SURGERY  2002   TONSILLECTOMY AND ADENOIDECTOMY  2002   Social History   Socioeconomic History   Marital status: Married    Spouse name: Not on file   Number of children: 1   Years of education: H/S   Highest education level: Not on file  Occupational History   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[employer]  Tobacco Use   Smoking status: Former    Types: Cigars    Quit date: 04/05/2017    Years since quitting: 7.0   Smokeless tobacco: Never   Tobacco comments:    occasionally smoked intermittenlty for a few years  Substance and Sexual Activity   Alcohol use: Yes    Alcohol/week: 0.0 standard drinks of alcohol    Comment: Occasional   Drug use: No   Sexual activity: Not on file  Other Topics Concern   Not on file  Social History Narrative   Not on file   Social Drivers of Health   Tobacco Use: Medium Risk (04/09/2024)   Patient History  Smoking Tobacco Use: Former    Smokeless Tobacco Use: Never    Passive Exposure: Not on file  Financial Resource Strain: Low Risk (04/08/2023)   Overall Financial Resource Strain (CARDIA)    Difficulty of Paying Living Expenses: Not hard at all  Food Insecurity: No Food Insecurity (04/08/2023)   Hunger Vital Sign    Worried About Running Out of Food in the Last Year: Never true    Ran Out of Food in the Last Year: Never true  Transportation Needs: No Transportation Needs (04/08/2023)   PRAPARE - Administrator, Civil Service (Medical): No    Lack of Transportation (Non-Medical): No  Physical Activity: Sufficiently Active (04/08/2023)   Exercise Vital Sign    Days of Exercise per Week: 7 days    Minutes of Exercise per Session: 60 min  Stress: No Stress Concern Present (04/08/2023)   [employer] of  Occupational Health - Occupational Stress Questionnaire    Feeling of Stress : Not at all  Social Connections: Moderately Isolated (04/08/2023)   Social Connection and Isolation Panel    Frequency of Communication with Friends and Family: More than three times a week    Frequency of Social Gatherings with Friends and Family: Twice a week    Attends Religious Services: Never    Database Administrator or Organizations: No    Attends Banker Meetings: Never    Marital Status: Married  Catering Manager Violence: Not At Risk (04/08/2023)   Humiliation, Afraid, Rape, and Kick questionnaire    Fear of Current or Ex-Partner: No    Emotionally Abused: No    Physically Abused: No    Sexually Abused: No  Depression (PHQ2-9): Low Risk (04/09/2024)   Depression (PHQ2-9)    PHQ-2 Score: 0  Alcohol Screen: Low Risk (04/14/2022)   Alcohol Screen    Last Alcohol Screening Score (AUDIT): 5  Housing: Low Risk (04/08/2023)   Housing Stability Vital Sign    Unable to Pay for Housing in the Last Year: No    Number of Times Moved in the Last Year: 0    Homeless in the Last Year: No  Utilities: Not At Risk (04/08/2023)   AHC Utilities    Threatened with loss of utilities: No  Health Literacy: Adequate Health Literacy (04/08/2023)   B1300 Health Literacy    Frequency of need for help with medical instructions: Never   Family Status  Relation Name Status   Mother  Deceased   Father  Deceased   Sister  Deceased   Brother  Alive   Sister  Alive   Sister  Alive  No partnership data on file   Family History  Problem Relation Age of Onset   Anuerysm Mother        Brain Aneurysm   Heart attack Father    Heart disease Father 81 - 40   Ovarian cancer Sister    Healthy Brother    Healthy Sister    Healthy Sister    Allergies[1]  Patient Care Team: Gasper Nancyann BRAVO, MD as PCP - General (Family Medicine) Jackquline Sawyer, MD (Dermatology) Letha Cancer, MD as Consulting Physician (Physical  Medicine and Rehabilitation) Pa, [facility], Teodoro K, MD as Consulting Physician (Gastroenterology)   Medications: Show/hide medication list[2]  Review of Systems  Constitutional:  Negative for appetite change, chills and fever.  Respiratory:  Negative for chest tightness, shortness of breath and wheezing.   Cardiovascular:  Negative for chest  pain and palpitations.  Gastrointestinal:  Negative for abdominal pain, nausea and vomiting.      Objective    BP (!) 152/79 (BP Location: Right Arm, Patient Position: Sitting, Cuff Size: Normal)   Pulse 83   Resp 16   Ht 5' 10 (1.778 m)   Wt 196 lb (88.9 kg)   SpO2 100%   BMI 28.12 kg/m    Physical Exam  General Appearance:    Well developed, well nourished male. Alert, cooperative, in no acute distress, appears stated age  Head:    Normocephalic, without obvious abnormality, atraumatic  Eyes:    PERRL, conjunctiva/corneas clear, EOM's intact, fundi    benign, both eyes       Ears:    Normal TM's and external ear canals, both ears  Nose:   Nares normal, septum midline, mucosa normal, no drainage   or sinus tenderness  Throat:   Lips, mucosa, and tongue normal; teeth and gums normal  Neck:   Supple, symmetrical, trachea midline, no adenopathy;       thyroid:  No enlargement/tenderness/nodules; no carotid   bruit or JVD  Back:     Symmetric, no curvature, ROM normal, no CVA tenderness  Lungs:     Clear to auscultation bilaterally, respirations unlabored  Chest wall:    No tenderness or deformity  Heart:    Normal heart rate. Normal rhythm. No murmurs, rubs, or gallops.  S1 and S2 normal  Abdomen:     Soft, non-tender, bowel sounds active all four quadrants,    no masses, no organomegaly  Genitalia:    deferred  Rectal:    deferred  Extremities:   All extremities are intact. No cyanosis or edema  Pulses:   2+ and symmetric all extremities  Skin:   Skin color, texture, turgor normal, no rashes or lesions   Lymph nodes:   Cervical, supraclavicular, and axillary nodes normal  Neurologic:   CNII-XII intact. Normal strength, sensation and reflexes      throughout        Assessment & Plan    Routine Health Maintenance and Physical Exam  Exercise Activities and Dietary recommendations  Goals   None     Immunization History  Administered Date(s) Administered   Fluad Quad(high Dose 65+) 04/01/2021, 04/14/2022   Fluad Trivalent(High Dose 65+) 04/08/2023   Influenza,inj,Quad PF,6+ Mos 12/22/2017, 01/26/2019, 03/05/2020   Influenza-Unspecified 01/17/2017   PFIZER(Purple Top)SARS-COV-2 Vaccination 07/12/2019, 08/07/2019, 03/05/2020   PNEUMOCOCCAL CONJUGATE-20 04/01/2021   Td 12/22/2017   Tdap 07/25/2007   Zoster Recombinant(Shingrix ) 12/15/2016, 02/16/2017    Health Maintenance  Topic Date Due   Influenza Vaccine  11/04/2023   COVID-19 Vaccine (4 - 2025-26 season) 12/05/2023   Medicare Annual Wellness (AWV)  04/07/2024   Colonoscopy  07/07/2026   DTaP/Tdap/Td (3 - Td or Tdap) 12/23/2027   Pneumococcal Vaccine: 50+ Years  Completed   Hepatitis C Screening  Completed   Zoster Vaccines- Shingrix   Completed   Meningococcal B Vaccine  Aged Out    Discussed health benefits of physical activity, and encouraged him to engage in regular exercise appropriate for his age and condition.     Primary hypertension Home blood pressure readings acceptable; office readings slightly elevated due to white coat syndrome. - Continue metoprolol  and Diovan  HCT. - Monitor blood pressure at home biweekly.  Hypogonadism, male Testosterone  gel effective with improved mood and energy. - Continue testosterone  gel once daily.  Gastroesophageal reflux disease Nexium effectively controls symptoms. - Continue Nexium as  prescribed.  General Health Maintenance Discussed flu vaccine importance; he agreed to receive it. - Administered flu vaccine.     Return in about 1 year (around 04/09/2025) for Annual  Wellness Visit.        Nancyann Suastegui, MD  [facility] [phone] (phone) [phone] (fax)  [facility]    [1]  Allergies Allergen Reactions   Amlodipine Swelling    Facial swelling  amlodipine   Atorvastatin Other (See Comments)    weakness  atorvastatin   Fenofibrate Other (See Comments)    Other reaction(s): Muscle Pain  Elevated CK  fenofibrate   Gemfibrozil Other (See Comments)    Other reaction(s): Muscle Pain  gemfibrozil   Niacin     Flushing   Benazepril  Rash and Dermatitis  [2]  Outpatient Medications Prior to Visit  Medication Sig   aspirin 81 MG tablet    esomeprazole (NEXIUM) 40 MG capsule TAKE 1 CAPSULE BY MOUTH ONCE DAILY   flunisolide (NASALIDE) 25 MCG/ACT (0.025%) SOLN USE ONE SPRAY IN EACH NOSTRIL ONCE A DAY   fluorouracil  (EFUDEX ) 5 % cream Apply topically 2 (two) times daily. Bid for 2 to 4 weeks to right nose tip until area raw, scabbed then stop   lovastatin  (MEVACOR ) 20 MG tablet TAKE 1 TABLET BY MOUTH DAILY   meloxicam  (MOBIC ) 15 MG tablet TAKE ONE TABLET BY MOUTH EVERY DAY WITH A MEAL   metoprolol  succinate (TOPROL -XL) 25 MG 24 hr tablet TAKE 1 TABLET BY MOUTH ONCE DAILY   Testosterone  1.62 % GEL PLACE ONE PUMP ONTO THE SKIN DAILY AS DIRECTED   triamcinolone  cream (KENALOG ) 0.1 % APPLY TO AFFECTED AREAS OF ITCHY RASH TOPICALLY ONCE TO TWICE DAILY UNITL CLEAR THEN AS NEEDED FOR FLARES. AVOID FACE,GROIN, AND AXILLA.   valsartan -hydrochlorothiazide  (DIOVAN -HCT) 320-25 MG tablet TAKE 1 TABLET BY MOUTH DAILY.   No facility-administered medications prior to visit.   "

## 2024-04-09 NOTE — Addendum Note (Signed)
 Addended by: MARYLEN ODELLA CROME on: 04/09/2024 10:27 AM   Modules accepted: Orders

## 2024-04-10 ENCOUNTER — Ambulatory Visit: Payer: Self-pay | Admitting: Family Medicine

## 2024-04-10 LAB — COMPREHENSIVE METABOLIC PANEL WITH GFR
ALT: 29 IU/L (ref 0–44)
AST: 25 IU/L (ref 0–40)
Albumin: 4.7 g/dL (ref 3.9–4.9)
Alkaline Phosphatase: 54 IU/L (ref 47–123)
BUN/Creatinine Ratio: 16 (ref 10–24)
BUN: 14 mg/dL (ref 8–27)
Bilirubin Total: 0.8 mg/dL (ref 0.0–1.2)
CO2: 25 mmol/L (ref 20–29)
Calcium: 9.9 mg/dL (ref 8.6–10.2)
Chloride: 102 mmol/L (ref 96–106)
Creatinine, Ser: 0.86 mg/dL (ref 0.76–1.27)
Globulin, Total: 1.8 g/dL (ref 1.5–4.5)
Glucose: 106 mg/dL — ABNORMAL HIGH (ref 70–99)
Potassium: 4.5 mmol/L (ref 3.5–5.2)
Sodium: 140 mmol/L (ref 134–144)
Total Protein: 6.5 g/dL (ref 6.0–8.5)
eGFR: 94 mL/min/1.73

## 2024-04-10 LAB — CBC
Hematocrit: 43.9 % (ref 37.5–51.0)
Hemoglobin: 14.8 g/dL (ref 13.0–17.7)
MCH: 30.7 pg (ref 26.6–33.0)
MCHC: 33.7 g/dL (ref 31.5–35.7)
MCV: 91 fL (ref 79–97)
Platelets: 226 x10E3/uL (ref 150–450)
RBC: 4.82 x10E6/uL (ref 4.14–5.80)
RDW: 13.1 % (ref 11.6–15.4)
WBC: 7.1 x10E3/uL (ref 3.4–10.8)

## 2024-04-10 LAB — LIPID PANEL
Chol/HDL Ratio: 4 ratio (ref 0.0–5.0)
Cholesterol, Total: 174 mg/dL (ref 100–199)
HDL: 43 mg/dL
LDL Chol Calc (NIH): 90 mg/dL (ref 0–99)
Triglycerides: 244 mg/dL — ABNORMAL HIGH (ref 0–149)
VLDL Cholesterol Cal: 41 mg/dL — ABNORMAL HIGH (ref 5–40)

## 2024-04-10 LAB — PSA TOTAL (REFLEX TO FREE): Prostate Specific Ag, Serum: 0.8 ng/mL (ref 0.0–4.0)

## 2024-04-10 LAB — TESTOSTERONE,FREE AND TOTAL
Testosterone, Free: 6.1 pg/mL — ABNORMAL LOW (ref 6.6–18.1)
Testosterone: 178 ng/dL — ABNORMAL LOW (ref 264–916)

## 2024-04-16 ENCOUNTER — Ambulatory Visit: Admitting: Dermatology

## 2024-04-16 ENCOUNTER — Other Ambulatory Visit: Payer: Self-pay | Admitting: Family Medicine

## 2024-04-16 ENCOUNTER — Encounter: Payer: Self-pay | Admitting: Dermatology

## 2024-04-16 DIAGNOSIS — W908XXA Exposure to other nonionizing radiation, initial encounter: Secondary | ICD-10-CM | POA: Diagnosis not present

## 2024-04-16 DIAGNOSIS — L578 Other skin changes due to chronic exposure to nonionizing radiation: Secondary | ICD-10-CM

## 2024-04-16 DIAGNOSIS — C44321 Squamous cell carcinoma of skin of nose: Secondary | ICD-10-CM

## 2024-04-16 DIAGNOSIS — E781 Pure hyperglyceridemia: Secondary | ICD-10-CM

## 2024-04-16 NOTE — Progress Notes (Signed)
" ° °  Follow-Up Visit   Subjective  Tyler Roberts is a 69 y.o. male who presents for the following: 6 week follow up of SCC of the right nasal tip treated with 5-fluorouracil /calcipotriene cream. Patient states area scabbed over across tip of nose, reports scab came off right after Christmas.    The following portions of the chart were reviewed this encounter and updated as appropriate: medications, allergies, medical history  Review of Systems:  No other skin or systemic complaints except as noted in HPI or Assessment and Plan.  Objective  Well appearing patient in no apparent distress; mood and affect are within normal limits.   A focused examination was performed of the following areas: Right nasal tip  Relevant exam findings are noted in the Assessment and Plan.        Assessment & Plan   SQUAMOUS CELL CARCINOMA with one small focus of superficial infiltration OF THE RIGHT NASAL TIP Treated with 5-fluorouracil /calcipotriene cream bid x2 weeks, clear today. Photo taken today.  - No evidence of recurrence today - Recommend regular full body skin exams - Recommend daily broad spectrum sunscreen SPF 30+ to sun-exposed areas, reapply every 2 hours as needed.  - Call if any new or changing lesions are noted between office visits  ACTINIC DAMAGE - chronic, secondary to cumulative UV radiation exposure/sun exposure over time - diffuse scaly erythematous macules with underlying dyspigmentation - Recommend daily broad spectrum sunscreen SPF 30+ to sun-exposed areas, reapply every 2 hours as needed.  - Recommend staying in the shade or wearing long sleeves, sun glasses (UVA+UVB protection) and wide brim hats (4-inch brim around the entire circumference of the hat). - Call for new or changing lesions.       Return in about 4 months (around 08/14/2024) for w/ Dr. Jackquline, HxSCC, UBSE, recheck nose.  LILLETTE Lonell ROCKFORD Wilfred, CMA, am acting as scribe for Rexene Jackquline, MD.  Documentation:  I have reviewed the above documentation for accuracy and completeness, and I agree with the above.  Rexene Jackquline, MD  "

## 2024-04-16 NOTE — Patient Instructions (Signed)

## 2024-08-14 ENCOUNTER — Ambulatory Visit: Admitting: Dermatology

## 2025-02-25 ENCOUNTER — Ambulatory Visit: Admitting: Dermatology
